# Patient Record
Sex: Female | Born: 1960 | Race: Black or African American | Hispanic: No | Smoking: Former smoker
Health system: Southern US, Community
[De-identification: ages and names within clinical notes are randomized; demographics above are authoritative.]

## PROBLEM LIST (undated history)

## (undated) DIAGNOSIS — K219 Gastro-esophageal reflux disease without esophagitis: Secondary | ICD-10-CM

## (undated) DIAGNOSIS — D571 Sickle-cell disease without crisis: Secondary | ICD-10-CM

## (undated) DIAGNOSIS — M1990 Unspecified osteoarthritis, unspecified site: Secondary | ICD-10-CM

## (undated) DIAGNOSIS — I10 Essential (primary) hypertension: Secondary | ICD-10-CM

## (undated) DIAGNOSIS — D573 Sickle-cell trait: Secondary | ICD-10-CM

## (undated) DIAGNOSIS — D649 Anemia, unspecified: Secondary | ICD-10-CM

## (undated) DIAGNOSIS — T7840XA Allergy, unspecified, initial encounter: Secondary | ICD-10-CM

## (undated) DIAGNOSIS — E611 Iron deficiency: Secondary | ICD-10-CM

## (undated) DIAGNOSIS — IMO0002 Reserved for concepts with insufficient information to code with codable children: Secondary | ICD-10-CM

## (undated) HISTORY — DX: Sickle-cell trait: D57.3

## (undated) HISTORY — DX: Reserved for concepts with insufficient information to code with codable children: IMO0002

## (undated) HISTORY — PX: ENDOMETRIAL ABLATION: SHX621

## (undated) HISTORY — PX: ANTERIOR CRUCIATE LIGAMENT REPAIR: SHX115

## (undated) HISTORY — DX: Unspecified osteoarthritis, unspecified site: M19.90

## (undated) HISTORY — DX: Anemia, unspecified: D64.9

## (undated) HISTORY — DX: Allergy, unspecified, initial encounter: T78.40XA

## (undated) HISTORY — PX: TUBAL LIGATION: SHX77

## (undated) HISTORY — PX: CHOLECYSTECTOMY: SHX55

---

## 2010-11-11 ENCOUNTER — Emergency Department (HOSPITAL_COMMUNITY)
Admission: EM | Admit: 2010-11-11 | Discharge: 2010-11-11 | Disposition: A | Discharge status: Home / Self Care | Payer: 59 | Attending: Emergency Medicine | Admitting: Emergency Medicine

## 2010-11-11 DIAGNOSIS — R252 Cramp and spasm: Secondary | ICD-10-CM | POA: Insufficient documentation

## 2010-11-11 DIAGNOSIS — M62838 Other muscle spasm: Secondary | ICD-10-CM | POA: Insufficient documentation

## 2014-08-26 ENCOUNTER — Emergency Department (HOSPITAL_COMMUNITY): Payer: BLUE CROSS/BLUE SHIELD

## 2014-08-26 ENCOUNTER — Observation Stay (HOSPITAL_COMMUNITY)
Admission: EM | Admit: 2014-08-26 | Discharge: 2014-08-28 | Disposition: A | Discharge status: Home / Self Care | Payer: BLUE CROSS/BLUE SHIELD | Attending: Internal Medicine | Admitting: Internal Medicine

## 2014-08-26 ENCOUNTER — Encounter (HOSPITAL_COMMUNITY): Payer: Self-pay | Admitting: *Deleted

## 2014-08-26 DIAGNOSIS — F1099 Alcohol use, unspecified with unspecified alcohol-induced disorder: Secondary | ICD-10-CM | POA: Insufficient documentation

## 2014-08-26 DIAGNOSIS — K219 Gastro-esophageal reflux disease without esophagitis: Secondary | ICD-10-CM | POA: Insufficient documentation

## 2014-08-26 DIAGNOSIS — Z9049 Acquired absence of other specified parts of digestive tract: Secondary | ICD-10-CM | POA: Diagnosis not present

## 2014-08-26 DIAGNOSIS — Z7982 Long term (current) use of aspirin: Secondary | ICD-10-CM | POA: Insufficient documentation

## 2014-08-26 DIAGNOSIS — Z87891 Personal history of nicotine dependence: Secondary | ICD-10-CM | POA: Insufficient documentation

## 2014-08-26 DIAGNOSIS — R0789 Other chest pain: Secondary | ICD-10-CM | POA: Diagnosis not present

## 2014-08-26 DIAGNOSIS — R079 Chest pain, unspecified: Secondary | ICD-10-CM | POA: Diagnosis not present

## 2014-08-26 DIAGNOSIS — Z882 Allergy status to sulfonamides status: Secondary | ICD-10-CM | POA: Insufficient documentation

## 2014-08-26 DIAGNOSIS — Z8249 Family history of ischemic heart disease and other diseases of the circulatory system: Secondary | ICD-10-CM | POA: Diagnosis not present

## 2014-08-26 DIAGNOSIS — Z886 Allergy status to analgesic agent status: Secondary | ICD-10-CM | POA: Diagnosis not present

## 2014-08-26 HISTORY — DX: Gastro-esophageal reflux disease without esophagitis: K21.9

## 2014-08-26 HISTORY — DX: Iron deficiency: E61.1

## 2014-08-26 LAB — D-DIMER, QUANTITATIVE (NOT AT ARMC)

## 2014-08-26 LAB — CBC
HEMATOCRIT: 30.3 % — AB (ref 36.0–46.0)
Hemoglobin: 10.5 g/dL — ABNORMAL LOW (ref 12.0–15.0)
MCH: 28.2 pg (ref 26.0–34.0)
MCHC: 34.7 g/dL (ref 30.0–36.0)
MCV: 81.5 fL (ref 78.0–100.0)
PLATELETS: 289 10*3/uL (ref 150–400)
RBC: 3.72 MIL/uL — ABNORMAL LOW (ref 3.87–5.11)
RDW: 14.5 % (ref 11.5–15.5)
WBC: 5.7 10*3/uL (ref 4.0–10.5)

## 2014-08-26 LAB — BASIC METABOLIC PANEL
ANION GAP: 9 (ref 5–15)
BUN: 19 mg/dL (ref 6–23)
CO2: 25 mmol/L (ref 19–32)
CREATININE: 0.96 mg/dL (ref 0.50–1.10)
Calcium: 8.9 mg/dL (ref 8.4–10.5)
Chloride: 104 mmol/L (ref 96–112)
GFR calc Af Amer: 77 mL/min — ABNORMAL LOW (ref >90)
GFR, EST NON AFRICAN AMERICAN: 67 mL/min — AB (ref >90)
Glucose, Bld: 99 mg/dL (ref 70–99)
Potassium: 4 mmol/L (ref 3.5–5.1)
Sodium: 138 mmol/L (ref 135–145)

## 2014-08-26 LAB — TROPONIN I: Troponin I: <0.03 ng/mL (ref <0.031)

## 2014-08-26 LAB — I-STAT TROPONIN, ED: Troponin i, poc: 0 ng/mL (ref 0.00–0.08)

## 2014-08-26 MED ORDER — HYDROMORPHONE HCL 1 MG/ML IJ SOLN
1.0000 mg | Freq: Once | INTRAMUSCULAR | Status: AC
Start: 2014-08-26 — End: 2014-08-26
  Administered 2014-08-26: 1 mg via INTRAVENOUS
  Filled 2014-08-26: qty 1

## 2014-08-26 MED ORDER — NITROGLYCERIN 0.4 MG SL SUBL
0.4000 mg | SUBLINGUAL_TABLET | SUBLINGUAL | Status: DC | PRN
  Administered 2014-08-26 – 2014-08-27 (×3): 0.4 mg via SUBLINGUAL

## 2014-08-26 MED ORDER — DIPHENHYDRAMINE HCL 50 MG/ML IJ SOLN
12.5000 mg | Freq: Once | INTRAMUSCULAR | Status: AC
  Administered 2014-08-26: 12.5 mg via INTRAVENOUS
  Filled 2014-08-26: qty 1

## 2014-08-26 MED ORDER — NITROGLYCERIN 0.4 MG SL SUBL
SUBLINGUAL_TABLET | SUBLINGUAL | Status: AC
  Administered 2014-08-26: 0.4 mg via SUBLINGUAL
  Filled 2014-08-26: qty 1

## 2014-08-26 MED ORDER — IOHEXOL 350 MG/ML SOLN
100.0000 mL | Freq: Once | INTRAVENOUS | Status: AC | PRN
  Administered 2014-08-26: 100 mL via INTRAVENOUS

## 2014-08-26 MED ORDER — CHLORHEXIDINE GLUCONATE 0.12 % MT SOLN
15.0000 mL | Freq: Two times a day (BID) | OROMUCOSAL | Status: DC
  Administered 2014-08-27 (×3): 15 mL via OROMUCOSAL
  Filled 2014-08-26 (×7): qty 15

## 2014-08-26 MED ORDER — ACETAMINOPHEN 325 MG PO TABS
650.0000 mg | ORAL_TABLET | ORAL | Status: DC | PRN
  Administered 2014-08-27 – 2014-08-28 (×3): 650 mg via ORAL
  Filled 2014-08-26 (×3): qty 2

## 2014-08-26 MED ORDER — CETYLPYRIDINIUM CHLORIDE 0.05 % MT LIQD
7.0000 mL | Freq: Two times a day (BID) | OROMUCOSAL | Status: DC
  Administered 2014-08-27: 7 mL via OROMUCOSAL

## 2014-08-26 MED ORDER — DIPHENHYDRAMINE HCL 50 MG/ML IJ SOLN
50.0000 mg | Freq: Once | INTRAMUSCULAR | Status: AC
  Administered 2014-08-26: 50 mg via INTRAVENOUS
  Filled 2014-08-26: qty 1

## 2014-08-26 MED ORDER — KETOROLAC TROMETHAMINE 30 MG/ML IJ SOLN
30.0000 mg | Freq: Once | INTRAMUSCULAR | Status: AC
  Administered 2014-08-26: 30 mg via INTRAVENOUS
  Filled 2014-08-26: qty 1

## 2014-08-26 MED ORDER — HEPARIN SODIUM (PORCINE) 5000 UNIT/ML IJ SOLN
5000.0000 [IU] | Freq: Three times a day (TID) | INTRAMUSCULAR | Status: DC
  Administered 2014-08-26 – 2014-08-28 (×4): 5000 [IU] via SUBCUTANEOUS
  Filled 2014-08-26 (×10): qty 1

## 2014-08-26 MED ORDER — ASPIRIN 81 MG PO CHEW
324.0000 mg | CHEWABLE_TABLET | Freq: Once | ORAL | Status: AC
  Administered 2014-08-26: 324 mg via ORAL
  Filled 2014-08-26: qty 4

## 2014-08-26 MED ORDER — ONDANSETRON HCL 4 MG/2ML IJ SOLN: 4.0000 mg | Freq: Four times a day (QID) | INTRAMUSCULAR | Status: DC | PRN

## 2014-08-26 MED ORDER — HYDROMORPHONE HCL 1 MG/ML IJ SOLN
1.0000 mg | Freq: Once | INTRAMUSCULAR | Status: AC
  Administered 2014-08-26: 1 mg via INTRAVENOUS
  Filled 2014-08-26: qty 1

## 2014-08-26 NOTE — H&P (Signed)
[facility] History and Physical  Barbara Eonllen Llanas ZOX:[id number] DOB: 23-Aug-1960 DOA: 08/26/2014  Referring physician: EDP PCP: No primary care provider on file.   Chief Complaint: Chest pain   HPI: Barbara [name] is a 54 y.o. female who presents to the ED with 1 hour history of sudden onset severe chest pain.  Central chest pain that radiates to her back.  Feels "like a bomb exploded in her chest".  Symptoms onset at rest while driving, have improved somewhat with pain meds in ED but are persistent.  Review of Systems: Systems reviewed.  As above, otherwise negative  Past Medical History  Diagnosis Date  . Low iron   . GERD (gastroesophageal reflux disease)    Past Surgical History  Procedure Laterality Date  . Cholecystectomy    . Anterior cruciate ligament repair    . Endometrial ablation     Social History:  reports that she has never smoked. She does not have any smokeless tobacco history on file. She reports that she drinks alcohol. She reports that she does not use illicit drugs.  Allergies  Allergen Reactions  . Morphine And Related Itching  . Sulfa Antibiotics Swelling    Family History  Problem Relation Age of Onset  . CAD Brother 2245    CABG at age 53, and deceased  . CAD Sister 50    1 stent  . CAD Father 4350    Deceased in 8150s     Prior to Admission medications   Medication Sig Start Date End Date Taking? Authorizing Provider  cyclobenzaprine (FLEXERIL) 5 MG tablet Take 1 tablet by mouth 3 (three) times daily as needed. 08/14/14  Yes Historical Provider, MD  loratadine (CLARITIN) 10 MG tablet Take 10 mg by mouth daily.   Yes Historical Provider, MD  meloxicam (MOBIC) 7.5 MG tablet Take 1 tablet by mouth daily as needed. 08/14/14  Yes Historical Provider, MD  omeprazole (PRILOSEC OTC) 20 MG tablet Take 20 mg by mouth daily.   Yes Historical Provider, MD   Physical Exam: Filed Vitals:   08/26/14 2139  BP: 152/93  Pulse: 74  Temp:   Resp: 18    BP  152/93 mmHg  Pulse 74  Temp(Src) 97.9 F (36.6 C) (Oral)  Resp 18  Ht 5\' 9"  (1.753 m)  Wt 77.111 kg (170 lb)  BMI 25.09 kg/m2  SpO2 97%  General Appearance:    Alert, oriented, no distress, appears stated age  Head:    Normocephalic, atraumatic  Eyes:    PERRL, EOMI, sclera non-icteric        Nose:   Nares without drainage or epistaxis. Mucosa, turbinates normal  Throat:   Moist mucous membranes. Oropharynx without erythema or exudate.  Neck:   Supple. No carotid bruits.  No thyromegaly.  No lymphadenopathy.   Back:     No CVA tenderness, no spinal tenderness  Lungs:     Clear to auscultation bilaterally, without wheezes, rhonchi or rales  Chest wall:    No tenderness to palpitation  Heart:    Regular rate and rhythm without murmurs, gallops, rubs  Abdomen:     Soft, non-tender, nondistended, normal bowel sounds, no organomegaly  Genitalia:    deferred  Rectal:    deferred  Extremities:   No clubbing, cyanosis or edema.  Pulses:   2+ and symmetric all extremities  Skin:   Skin color, texture, turgor normal, no rashes or lesions  Lymph nodes:   Cervical, supraclavicular, and axillary nodes normal  Neurologic:   CNII-XII intact. Normal strength, sensation and reflexes      throughout    Labs on Admission:  Basic Metabolic Panel:  Recent Labs Lab 08/26/14 1716  NA 138  K 4.0  CL 104  CO2 25  GLUCOSE 99  BUN 19  CREATININE 0.96  CALCIUM 8.9   Liver Function Tests: No results for input(s): AST, ALT, ALKPHOS, BILITOT, PROT, ALBUMIN in the last 168 hours. No results for input(s): LIPASE, AMYLASE in the last 168 hours. No results for input(s): AMMONIA in the last 168 hours. CBC:  Recent Labs Lab 08/26/14 1716  WBC 5.7  HGB 10.5*  HCT 30.3*  MCV 81.5  PLT 289   Cardiac Enzymes: No results for input(s): CKTOTAL, CKMB, CKMBINDEX, TROPONINI in the last 168 hours.  BNP (last 3 results) No results for input(s): PROBNP in the last 8760 hours. CBG: No results for  input(s): GLUCAP in the last 168 hours.  Radiological Exams on Admission: Dg Chest Portable 1 View  08/26/2014   CLINICAL DATA:  Chest pain and difficulty breathing, acute  EXAM: PORTABLE CHEST - 1 VIEW  COMPARISON:  None.  FINDINGS: Lungs are clear. Heart size and pulmonary vascularity are normal. No adenopathy. No pneumothorax. No bone lesions.  IMPRESSION: No edema or consolidation.   Electronically Signed   By: Bretta Bang III M.D.   On: 08/26/2014 17:58   Ct Angio Chest Aorta W/cm &/or Wo/cm  08/26/2014   CLINICAL DATA:  Sudden onset chest pain radiating to back, shortness of breath, light headedness  EXAM: CT ANGIOGRAPHY CHEST, ABDOMEN AND PELVIS  TECHNIQUE: Multidetector CT imaging through the chest, abdomen and pelvis was performed using the standard protocol during bolus administration of intravenous contrast. Multiplanar reconstructed images and MIPs were obtained and reviewed to evaluate the vascular anatomy.  CONTRAST:  OMNIPAQUE IOHEXOL 350 MG/ML SOLN  COMPARISON:  None.  FINDINGS: Unenhanced CT, there is no evidence of intramural hematoma.  No evidence of thoracoabdominal aortic aneurysm or dissection.  Although not tailored for evaluation of the pulmonary arteries, there is no evidence of pulmonary embolism.  Celiac artery, SMA, IMA, renal arteries, iliac arteries are patent.  CT CHEST FINDINGS  Mediastinum/Nodes: The heart is normal in size. No pericardial effusion.  No suspicious mediastinal, hilar, or axillary lymphadenopathy.  Visualized thyroid is unremarkable.  Lungs/Pleura: Lungs are clear.  No focal consolidation.  No suspicious pulmonary nodules.  No pleural effusion or pneumothorax.  Musculoskeletal: No focal osseous lesions.  CT ABDOMEN PELVIS FINDINGS  Hepatobiliary: Liver is notable for focal fat/altered perfusion along the falciform ligament.  Status post cholecystectomy. No intrahepatic or extrahepatic ductal dilatation.  Pancreas: Within normal limits.  Spleen: Within  normal limits.  Adrenals/Urinary Tract: Adrenal glands are within normal limits.  Kidneys within normal limits.  No hydronephrosis.  Bladder is within normal limits.  Stomach/Bowel: Stomach is within normal limits.  No evidence of bowel obstruction.  Normal appendix.  Vascular/Lymphatic: Vascular findings as above.  No suspicious abdominopelvic lymphadenopathy.  Reproductive: Uterus is unremarkable.  Bilateral ovaries are within normal limits.  Other: No abdominopelvic ascites.  Musculoskeletal: Degenerative changes at L5-S1.  Review of the MIP images confirms the above findings.  IMPRESSION: No evidence of thoracoabdominal aortic aneurysm or dissection.  No evidence of pulmonary embolism.  Normal CT chest.  Negative CT abdomen/pelvis.   Electronically Signed   By: Charline Bills M.D.   On: 08/26/2014 20:03   Ct Angio Abd/pel W/ And/or W/o  08/26/2014  CLINICAL DATA:  Sudden onset chest pain radiating to back, shortness of breath, light headedness  EXAM: CT ANGIOGRAPHY CHEST, ABDOMEN AND PELVIS  TECHNIQUE: Multidetector CT imaging through the chest, abdomen and pelvis was performed using the standard protocol during bolus administration of intravenous contrast. Multiplanar reconstructed images and MIPs were obtained and reviewed to evaluate the vascular anatomy.  CONTRAST:  OMNIPAQUE IOHEXOL 350 MG/ML SOLN  COMPARISON:  None.  FINDINGS: Unenhanced CT, there is no evidence of intramural hematoma.  No evidence of thoracoabdominal aortic aneurysm or dissection.  Although not tailored for evaluation of the pulmonary arteries, there is no evidence of pulmonary embolism.  Celiac artery, SMA, IMA, renal arteries, iliac arteries are patent.  CT CHEST FINDINGS  Mediastinum/Nodes: The heart is normal in size. No pericardial effusion.  No suspicious mediastinal, hilar, or axillary lymphadenopathy.  Visualized thyroid is unremarkable.  Lungs/Pleura: Lungs are clear.  No focal consolidation.  No suspicious pulmonary  nodules.  No pleural effusion or pneumothorax.  Musculoskeletal: No focal osseous lesions.  CT ABDOMEN PELVIS FINDINGS  Hepatobiliary: Liver is notable for focal fat/altered perfusion along the falciform ligament.  Status post cholecystectomy. No intrahepatic or extrahepatic ductal dilatation.  Pancreas: Within normal limits.  Spleen: Within normal limits.  Adrenals/Urinary Tract: Adrenal glands are within normal limits.  Kidneys within normal limits.  No hydronephrosis.  Bladder is within normal limits.  Stomach/Bowel: Stomach is within normal limits.  No evidence of bowel obstruction.  Normal appendix.  Vascular/Lymphatic: Vascular findings as above.  No suspicious abdominopelvic lymphadenopathy.  Reproductive: Uterus is unremarkable.  Bilateral ovaries are within normal limits.  Other: No abdominopelvic ascites.  Musculoskeletal: Degenerative changes at L5-S1.  Review of the MIP images confirms the above findings.  IMPRESSION: No evidence of thoracoabdominal aortic aneurysm or dissection.  No evidence of pulmonary embolism.  Normal CT chest.  Negative CT abdomen/pelvis.   Electronically Signed   By: Charline Bills M.D.   On: 08/26/2014 20:03    EKG: Independently reviewed.  Assessment/Plan Active Problems:   Chest pain   1. Chest pain - negative CTA for disection, d-dimer is negative, admitting for cardiac rule out 1. Serial trops 2. Chest pain obs protocol 3. Call cards re stress test in AM 4. HEART score is only 4.  Does have a very worrisome family history however.  Code Status: Full Code  Family Communication: Family at bedside Disposition Plan: Admit to obs   Time spent: 50 min  Lamyah Creed M. [facility] Pager [phone]  If 7AM-7PM, please contact the day team taking care of the patient [email or web address] Password [facility] 08/26/2014, 9:54 PM

## 2014-08-26 NOTE — ED Notes (Signed)
Pt reports she was driving and had a sudden onset sharp central chest pain approx PTA (1645). C/o sob, lightheadedness, radiation into back, R arm feeling numb, nausea and clamminess. Denies vomiting.

## 2014-08-26 NOTE — ED Provider Notes (Signed)
CSN: [id number]     Arrival date & time 08/26/14  1655 History   First MD Initiated Contact with Patient 08/26/14 1712     Chief Complaint  Patient presents with  . Chest Pain  . Arm Pain  . Dizziness     (Consider location/radiation/quality/duration/timing/severity/associated sxs/prior Treatment) HPI Comments: Was driving her car with sudden onset sharp chest pain. Feels like a bomb went off in her chest. No alleviating/exacerbating factors when asked, but when asked specifically with relation to deep breathing she reports it gets worse.  Patient is a 54 y.o. female presenting with chest pain, arm pain, and dizziness. The history is provided by the patient.  Chest Pain Pain location:  Substernal area Pain quality: radiating and sharp   Pain quality comment:  "like a bomb exploded" Pain radiates to:  Upper back and R arm Pain radiates to the back: yes   Pain severity:  Severe Onset quality:  Sudden Duration:  1 hour Timing:  Constant Progression:  Improving Chronicity:  New Context: at rest   Relieved by:  Nothing Worsened by:  Deep breathing Associated symptoms: diaphoresis, dizziness and shortness of breath   Associated symptoms: no abdominal pain, no cough, no fever and not vomiting   Risk factors: no aortic disease, no high cholesterol and no hypertension   Arm Pain Associated symptoms include chest pain and shortness of breath. Pertinent negatives include no abdominal pain.  Dizziness Associated symptoms: chest pain and shortness of breath   Associated symptoms: no vomiting     Past Medical History  Diagnosis Date  . Low iron   . GERD (gastroesophageal reflux disease)    Past Surgical History  Procedure Laterality Date  . Cholecystectomy    . Anterior cruciate ligament repair    . Endometrial ablation     No family history on file. History  Substance Use Topics  . Smoking status: Never Smoker   . Smokeless tobacco: Not on file  . Alcohol Use: Yes      Comment: occasionally, glass of wine   OB History    No data available     Review of Systems  Constitutional: Positive for diaphoresis. Negative for fever.  Respiratory: Positive for shortness of breath. Negative for cough.   Cardiovascular: Positive for chest pain. Negative for leg swelling.  Gastrointestinal: Negative for vomiting and abdominal pain.  Neurological: Positive for dizziness.  All other systems reviewed and are negative.     Allergies  Morphine and related and Sulfa antibiotics  Home Medications   Prior to Admission medications   Medication Sig Start Date End Date Taking? Authorizing Provider  cyclobenzaprine (FLEXERIL) 5 MG tablet Take 1 tablet by mouth 3 (three) times daily as needed. 08/14/14  Yes Historical Provider, MD  loratadine (CLARITIN) 10 MG tablet Take 10 mg by mouth daily.   Yes Historical Provider, MD  meloxicam (MOBIC) 7.5 MG tablet Take 1 tablet by mouth daily as needed. 08/14/14  Yes Historical Provider, MD  omeprazole (PRILOSEC OTC) 20 MG tablet Take 20 mg by mouth daily.   Yes Historical Provider, MD   BP 156/92 mmHg  Pulse 113  Temp(Src) 97.9 F (36.6 C) (Oral)  Resp 23  Ht  (1.753 m)  Wt 170 lb (77.111 kg)  BMI 25.09 kg/m2  SpO2 98% Physical Exam  Constitutional: She is oriented to person, place, and time. She appears well-developed and well-nourished. No distress.  HENT:  Head: Normocephalic and atraumatic.  Mouth/Throat: Oropharynx is clear and moist.  Eyes: EOM are normal. Pupils are equal, round, and reactive to light.  Neck: Normal range of motion. Neck supple.  Cardiovascular: Normal rate and regular rhythm.  Exam reveals no friction rub.   No murmur heard. Pulmonary/Chest: Effort normal and breath sounds normal. No respiratory distress. She has no wheezes. She has no rales.  Abdominal: Soft. She exhibits no distension. There is no tenderness. There is no rebound.  Musculoskeletal: Normal range of motion. She exhibits no  edema.  Neurological: She is alert and oriented to person, place, and time. No cranial nerve deficit. She exhibits normal muscle tone.  Skin: No rash noted. She is not diaphoretic.  Nursing note and vitals reviewed.   ED Course  Procedures (including critical care time) Labs Review Labs Reviewed  CBC  BASIC METABOLIC PANEL  D-DIMER, QUANTITATIVE  I-STAT TROPOININ, ED    Imaging Review Dg Chest Portable 1 View  08/26/2014   CLINICAL DATA:  Chest pain and difficulty breathing, acute  EXAM: PORTABLE CHEST - 1 VIEW  COMPARISON:  None.  FINDINGS: Lungs are clear. Heart size and pulmonary vascularity are normal. No adenopathy. No pneumothorax. No bone lesions.  IMPRESSION: No edema or consolidation.   Electronically Signed   By: Bretta Bang III M.D.   On: 08/26/2014 17:58   Ct Angio Chest Aorta W/cm &/or Wo/cm  08/26/2014   CLINICAL DATA:  Sudden onset chest pain radiating to back, shortness of breath, light headedness  EXAM: CT ANGIOGRAPHY CHEST, ABDOMEN AND PELVIS  TECHNIQUE: Multidetector CT imaging through the chest, abdomen and pelvis was performed using the standard protocol during bolus administration of intravenous contrast. Multiplanar reconstructed images and MIPs were obtained and reviewed to evaluate the vascular anatomy.  CONTRAST:  OMNIPAQUE IOHEXOL 350 MG/ML SOLN  COMPARISON:  None.  FINDINGS: Unenhanced CT, there is no evidence of intramural hematoma.  No evidence of thoracoabdominal aortic aneurysm or dissection.  Although not tailored for evaluation of the pulmonary arteries, there is no evidence of pulmonary embolism.  Celiac artery, SMA, IMA, renal arteries, iliac arteries are patent.  CT CHEST FINDINGS  Mediastinum/Nodes: The heart is normal in size. No pericardial effusion.  No suspicious mediastinal, hilar, or axillary lymphadenopathy.  Visualized thyroid is unremarkable.  Lungs/Pleura: Lungs are clear.  No focal consolidation.  No suspicious pulmonary nodules.  No  pleural effusion or pneumothorax.  Musculoskeletal: No focal osseous lesions.  CT ABDOMEN PELVIS FINDINGS  Hepatobiliary: Liver is notable for focal fat/altered perfusion along the falciform ligament.  Status post cholecystectomy. No intrahepatic or extrahepatic ductal dilatation.  Pancreas: Within normal limits.  Spleen: Within normal limits.  Adrenals/Urinary Tract: Adrenal glands are within normal limits.  Kidneys within normal limits.  No hydronephrosis.  Bladder is within normal limits.  Stomach/Bowel: Stomach is within normal limits.  No evidence of bowel obstruction.  Normal appendix.  Vascular/Lymphatic: Vascular findings as above.  No suspicious abdominopelvic lymphadenopathy.  Reproductive: Uterus is unremarkable.  Bilateral ovaries are within normal limits.  Other: No abdominopelvic ascites.  Musculoskeletal: Degenerative changes at L5-S1.  Review of the MIP images confirms the above findings.  IMPRESSION: No evidence of thoracoabdominal aortic aneurysm or dissection.  No evidence of pulmonary embolism.  Normal CT chest.  Negative CT abdomen/pelvis.   Electronically Signed   By: Charline Bills M.D.   On: 08/26/2014 20:03   Ct Angio Abd/pel W/ And/or W/o  08/26/2014   CLINICAL DATA:  Sudden onset chest pain radiating to back, shortness of breath, light headedness  EXAM:  CT ANGIOGRAPHY CHEST, ABDOMEN AND PELVIS  TECHNIQUE: Multidetector CT imaging through the chest, abdomen and pelvis was performed using the standard protocol during bolus administration of intravenous contrast. Multiplanar reconstructed images and MIPs were obtained and reviewed to evaluate the vascular anatomy.  CONTRAST:  100mL OMNIPAQUE IOHEXOL 350 MG/ML SOLN  COMPARISON:  None.  FINDINGS: Unenhanced CT, there is no evidence of intramural hematoma.  No evidence of thoracoabdominal aortic aneurysm or dissection.  Although not tailored for evaluation of the pulmonary arteries, there is no evidence of pulmonary embolism.  Celiac  artery, SMA, IMA, renal arteries, iliac arteries are patent.  CT CHEST FINDINGS  Mediastinum/Nodes: The heart is normal in size. No pericardial effusion.  No suspicious mediastinal, hilar, or axillary lymphadenopathy.  Visualized thyroid is unremarkable.  Lungs/Pleura: Lungs are clear.  No focal consolidation.  No suspicious pulmonary nodules.  No pleural effusion or pneumothorax.  Musculoskeletal: No focal osseous lesions.  CT ABDOMEN PELVIS FINDINGS  Hepatobiliary: Liver is notable for focal fat/altered perfusion along the falciform ligament.  Status post cholecystectomy. No intrahepatic or extrahepatic ductal dilatation.  Pancreas: Within normal limits.  Spleen: Within normal limits.  Adrenals/Urinary Tract: Adrenal glands are within normal limits.  Kidneys within normal limits.  No hydronephrosis.  Bladder is within normal limits.  Stomach/Bowel: Stomach is within normal limits.  No evidence of bowel obstruction.  Normal appendix.  Vascular/Lymphatic: Vascular findings as above.  No suspicious abdominopelvic lymphadenopathy.  Reproductive: Uterus is unremarkable.  Bilateral ovaries are within normal limits.  Other: No abdominopelvic ascites.  Musculoskeletal: Degenerative changes at L5-S1.  Review of the MIP images confirms the above findings.  IMPRESSION: No evidence of thoracoabdominal aortic aneurysm or dissection.  No evidence of pulmonary embolism.  Normal CT chest.  Negative CT abdomen/pelvis.   Electronically Signed   By: Charline BillsSriyesh  Krishnan M.D.   On: 08/26/2014 20:03     EKG Interpretation   Date/Time:  Tuesday August 26 2014 17:02:16 EDT Ventricular Rate:  117 PR Interval:  117 QRS Duration: 80 QT Interval:  413 QTC Calculation: 576 R Axis:   64 Text Interpretation:  Sinus tachycardia Probable left atrial enlargement  Repol abnrm suggests ischemia, anterolateral Prolonged QT interval  Baseline wander in lead(s) I III aVL No prior for comparison Confirmed by  Gwendolyn GrantWALDEN  MD, Veronnica Hennings ([id number]) on  08/26/2014 5:12:35 PM      MDM   Final diagnoses:  Chest pain    54 year old female here with chest pain. I'm concern for aortic dissection. She reports it feels like a bomb went off in her chest and in the pain with straight her back. His fascia. She also reports radiation of the right arm with right arm tingling. She denies any lower extremity symptoms. No alleviating or exacerbating factors when asked, more specifically about deep breaths she said it does make the pain worse. No history of blood clots, high blood pressure, smoking. Does have strong family history of heart disease. Blood pressures equal in both arms. With the "bomb-like" sensation, will CT her Aorta. CTs negative, no dissection or PE. Still having pain. HEART score of 4 - 1 for story, 1 for age, 2 for risk factors. Admitted for ACS.  Elwin MochaBlair Mariame Rybolt, MD 08/26/14 [phone]

## 2014-08-26 NOTE — Progress Notes (Signed)
EDCM spoke to patient at bedside.  Patient confirms she has [employer] and [employer] without a pcp.  EDCM provided patient with list of pcps who accept [employer] insurance within a ten mile radius of patient's zip code.  Discussed the use of urgent care centers versus ED.  Discussed importance and purpose of having a pcp.  Patient thankful for services.  No further EDCM needs at this time.

## 2014-08-27 ENCOUNTER — Encounter (HOSPITAL_COMMUNITY): Payer: Self-pay | Admitting: Cardiology

## 2014-08-27 ENCOUNTER — Observation Stay (HOSPITAL_COMMUNITY): Payer: BLUE CROSS/BLUE SHIELD

## 2014-08-27 ENCOUNTER — Encounter (HOSPITAL_COMMUNITY)
Admission: EM | Disposition: A | Discharge status: Home / Self Care | Payer: Self-pay | Source: Home / Self Care | Attending: Emergency Medicine

## 2014-08-27 DIAGNOSIS — R0789 Other chest pain: Secondary | ICD-10-CM | POA: Diagnosis not present

## 2014-08-27 DIAGNOSIS — K219 Gastro-esophageal reflux disease without esophagitis: Secondary | ICD-10-CM | POA: Diagnosis not present

## 2014-08-27 DIAGNOSIS — Z87891 Personal history of nicotine dependence: Secondary | ICD-10-CM | POA: Diagnosis not present

## 2014-08-27 DIAGNOSIS — I209 Angina pectoris, unspecified: Secondary | ICD-10-CM | POA: Diagnosis not present

## 2014-08-27 DIAGNOSIS — I200 Unstable angina: Secondary | ICD-10-CM | POA: Diagnosis not present

## 2014-08-27 DIAGNOSIS — F1099 Alcohol use, unspecified with unspecified alcohol-induced disorder: Secondary | ICD-10-CM | POA: Diagnosis not present

## 2014-08-27 HISTORY — PX: LEFT HEART CATHETERIZATION WITH CORONARY ANGIOGRAM: SHX5451

## 2014-08-27 LAB — TROPONIN I: Troponin I: <0.03 ng/mL (ref <0.031)

## 2014-08-27 SURGERY — LEFT HEART CATHETERIZATION WITH CORONARY ANGIOGRAM
Anesthesia: LOCAL

## 2014-08-27 MED ORDER — FAMOTIDINE 10 MG PO TABS
10.0000 mg | ORAL_TABLET | Freq: Every day | ORAL | Status: DC
  Administered 2014-08-27: 10 mg via ORAL
  Filled 2014-08-27 (×2): qty 1

## 2014-08-27 MED ORDER — ASPIRIN 81 MG PO CHEW: 81.0000 mg | CHEWABLE_TABLET | ORAL | Status: DC

## 2014-08-27 MED ORDER — NITROGLYCERIN 1 MG/10 ML FOR IR/CATH LAB
INTRA_ARTERIAL | Status: AC
  Filled 2014-08-27: qty 10

## 2014-08-27 MED ORDER — LIDOCAINE HCL (PF) 1 % IJ SOLN
INTRAMUSCULAR | Status: AC
  Filled 2014-08-27: qty 30

## 2014-08-27 MED ORDER — ALUM & MAG HYDROXIDE-SIMETH 200-200-20 MG/5ML PO SUSP
30.0000 mL | ORAL | Status: DC | PRN
  Administered 2014-08-27: 30 mL via ORAL
  Filled 2014-08-27: qty 30

## 2014-08-27 MED ORDER — PANTOPRAZOLE SODIUM 40 MG PO TBEC
80.0000 mg | DELAYED_RELEASE_TABLET | Freq: Two times a day (BID) | ORAL | Status: DC
  Administered 2014-08-27: 80 mg via ORAL
  Filled 2014-08-27: qty 2

## 2014-08-27 MED ORDER — MIDAZOLAM HCL 2 MG/2ML IJ SOLN
INTRAMUSCULAR | Status: AC
  Filled 2014-08-27: qty 2

## 2014-08-27 MED ORDER — HEPARIN (PORCINE) IN NACL 2-0.9 UNIT/ML-% IJ SOLN
INTRAMUSCULAR | Status: AC
  Filled 2014-08-27: qty 1000

## 2014-08-27 MED ORDER — OMEPRAZOLE MAGNESIUM 20 MG PO TBEC: 40.0000 mg | DELAYED_RELEASE_TABLET | Freq: Two times a day (BID) | ORAL | Status: DC

## 2014-08-27 MED ORDER — FENTANYL CITRATE 0.05 MG/ML IJ SOLN
INTRAMUSCULAR | Status: AC
  Filled 2014-08-27: qty 2

## 2014-08-27 MED ORDER — VERAPAMIL HCL 2.5 MG/ML IV SOLN
INTRAVENOUS | Status: AC
  Filled 2014-08-27: qty 2

## 2014-08-27 MED ORDER — HEPARIN SODIUM (PORCINE) 1000 UNIT/ML IJ SOLN
INTRAMUSCULAR | Status: AC
  Filled 2014-08-27: qty 1

## 2014-08-27 MED ORDER — SODIUM CHLORIDE 0.9 % IJ SOLN: 3.0000 mL | INTRAMUSCULAR | Status: DC | PRN

## 2014-08-27 MED ORDER — NITROGLYCERIN 0.4 MG SL SUBL
SUBLINGUAL_TABLET | SUBLINGUAL | Status: AC
  Filled 2014-08-27: qty 1

## 2014-08-27 MED ORDER — SODIUM CHLORIDE 0.9 % IV SOLN
INTRAVENOUS | Status: AC
Start: 2014-08-27 — End: 2014-08-27
  Administered 2014-08-27: 19:00:00 via INTRAVENOUS

## 2014-08-27 MED ORDER — SODIUM CHLORIDE 0.9 % IJ SOLN
3.0000 mL | Freq: Two times a day (BID) | INTRAMUSCULAR | Status: DC
  Administered 2014-08-27: 3 mL via INTRAVENOUS

## 2014-08-27 MED ORDER — ASPIRIN 325 MG PO TABS
325.0000 mg | ORAL_TABLET | Freq: Every day | ORAL | Status: DC
  Administered 2014-08-27: 325 mg via ORAL
  Filled 2014-08-27: qty 1

## 2014-08-27 MED ORDER — LABETALOL HCL 5 MG/ML IV SOLN
10.0000 mg | Freq: Once | INTRAVENOUS | Status: AC
  Administered 2014-08-27: 10 mg via INTRAVENOUS

## 2014-08-27 MED ORDER — SODIUM CHLORIDE 0.9 % IV SOLN: 250.0000 mL | INTRAVENOUS | Status: DC | PRN

## 2014-08-27 MED ORDER — LABETALOL HCL 5 MG/ML IV SOLN
INTRAVENOUS | Status: AC
  Filled 2014-08-27: qty 4

## 2014-08-27 NOTE — Progress Notes (Signed)
Patient arrived to cath lab via EMS. Patient states no chest pain at this time. IV's patent. Rt DP pulse palpable. Patient resting comfortable at this time. Will continue to monitor patient.

## 2014-08-27 NOTE — Progress Notes (Signed)
Blood pressure slightly elevated. MD aware and medications given. Patient resting in bed with no complaints. TR band remains stable on Rt radial site. Report called to Brandi,RN [facility]. Patient to be transferred at this time.

## 2014-08-27 NOTE — Progress Notes (Signed)
Patient arrived to cath lab holding area. Patient has no complaints. VSS. Rt radial site WNL. Will continue to monitor patient.

## 2014-08-27 NOTE — H&P (View-Only) (Signed)
Reason for Consult: Chest pain  Requesting Physician: Dr. Darnelle Catalan  HPI: Barbara [name] a 54 year old mildly overweight divorced African-American female mother of 2 children works in a Music therapist at [employer]. She was admitted yesterday with chest pain which was nitroglycerin responsive. Her cardiac risk factor profile is notable for discontinued tobacco abuse although she only started smoking several years ago. She does have a fairly impressive family history with a father who died of myocardial infarction and 3 out of 4 siblings who have had coronary disease and intervention. She developed chest pain while driving home from work last night. The pain was substernal and radiated to her back. It was relieved with 2 sublingual nitroglycerin. She additionally received analgesics. She does have a history of bleeding ulcers in the past the last one of which was 7 years ago. She says these symptoms are not similar. She does take over-the-counter Prilosec. She was scheduled for a stress test today however she had recurrent chest pain that was nitrate responsive.  Problem List: Patient Active Problem List   Diagnosis Date Noted  . Chest pain 08/26/2014    PMHx:  Past Medical History  Diagnosis Date  . Low iron   . GERD (gastroesophageal reflux disease)    Past Surgical History  Procedure Laterality Date  . Cholecystectomy    . Anterior cruciate ligament repair    . Endometrial ablation      FAMHx: Family History  Problem Relation Age of Onset  . CAD Brother 44    CABG at age 62, and deceased  . CAD Sister 50    1 stent  . CAD Father 34    Deceased in 54s    SOCHx:  reports that she has never smoked. She does not have any smokeless tobacco history on file. She reports that she drinks alcohol. She reports that she does not use illicit drugs.  ALLERGIES: Allergies  Allergen Reactions  . Morphine And Related Itching  . Sulfa Antibiotics Swelling    ROS: Pertinent items are  noted in HPI.  HOME MEDICATIONS: Prescriptions prior to admission  Medication Sig Dispense Refill Last Dose  . cyclobenzaprine (FLEXERIL) 5 MG tablet Take 1 tablet by mouth 3 (three) times daily as needed.  0 08/23/2014  . loratadine (CLARITIN) 10 MG tablet Take 10 mg by mouth daily.   08/26/2014 at Unknown time  . meloxicam (MOBIC) 7.5 MG tablet Take 1 tablet by mouth daily as needed.  0 08/23/2014  . omeprazole (PRILOSEC OTC) 20 MG tablet Take 20 mg by mouth daily.   08/26/2014 at Unknown time    HOSPITAL MEDICATIONS: I have reviewed the patient's current medications.  VITALS: Blood pressure 113/78, pulse 89, temperature 98 F (36.7 C), temperature source Oral, resp. rate 18, height  (1.753 m), weight 177 lb 0.5 oz (80.3 kg), SpO2 99 %.  INPUT/OUTPUT I/O last 3 completed shifts: In: 240 [P.O.:240] Out: 600 [Urine:600]      PHYSICAL EXAM: General appearance: alert and no distress Neck: no adenopathy, no carotid bruit, no JVD, supple, symmetrical, trachea midline and thyroid not enlarged, symmetric, no tenderness/mass/nodules Lungs: clear to auscultation bilaterally Heart: regular rate and rhythm, S1, S2 normal, no murmur, click, rub or gallop Extremities: extremities normal, atraumatic, no cyanosis or edema and 2+ pedal pulses bilaterally  LABS:  BMP  Recent Labs  08/26/14 1716  NA 138  K 4.0  CL 104  CO2 25  GLUCOSE 99  BUN 19  CREATININE 0.96  CALCIUM 8.9  GFRNONAA 67*  GFRAA 77*    CBC  Recent Labs Lab 08/26/14 1716  WBC 5.7  RBC 3.72*  HGB 10.5*  HCT 30.3*  PLT 289  MCV 81.5    HEMOGLOBIN A1C No results found for: HGBA1C, MPG  Cardiac Panel (last 3 results)  Recent Labs  08/26/14 2225 08/27/14 0105 08/27/14 0345  TROPONINI <0.03 <0.03 <0.03    BNP (last 3 results) No results for input(s): PROBNP in the last 8760 hours.  TSH No results for input(s): TSH in the last 8760 hours.  CHOLESTEROL No results for input(s): CHOL in the last  8760 hours.  Hepatic Function Panel No results for input(s): PROT, ALBUMIN, AST, ALT, ALKPHOS, BILITOT, BILIDIR, IBILI in the last 8760 hours.  IMAGING: Dg Chest Portable 1 View  08/26/2014   CLINICAL DATA:  Chest pain and difficulty breathing, acute  EXAM: PORTABLE CHEST - 1 VIEW  COMPARISON:  None.  FINDINGS: Lungs are clear. Heart size and pulmonary vascularity are normal. No adenopathy. No pneumothorax. No bone lesions.  IMPRESSION: No edema or consolidation.   Electronically Signed   By: Bretta BangWilliam  Woodruff III M.D.   On: 08/26/2014 17:58   Ct Angio Chest Aorta W/cm &/or Wo/cm  08/26/2014   CLINICAL DATA:  Sudden onset chest pain radiating to back, shortness of breath, light headedness  EXAM: CT ANGIOGRAPHY CHEST, ABDOMEN AND PELVIS  TECHNIQUE: Multidetector CT imaging through the chest, abdomen and pelvis was performed using the standard protocol during bolus administration of intravenous contrast. Multiplanar reconstructed images and MIPs were obtained and reviewed to evaluate the vascular anatomy.  CONTRAST:  100mL OMNIPAQUE IOHEXOL 350 MG/ML SOLN  COMPARISON:  None.  FINDINGS: Unenhanced CT, there is no evidence of intramural hematoma.  No evidence of thoracoabdominal aortic aneurysm or dissection.  Although not tailored for evaluation of the pulmonary arteries, there is no evidence of pulmonary embolism.  Celiac artery, SMA, IMA, renal arteries, iliac arteries are patent.  CT CHEST FINDINGS  Mediastinum/Nodes: The heart is normal in size. No pericardial effusion.  No suspicious mediastinal, hilar, or axillary lymphadenopathy.  Visualized thyroid is unremarkable.  Lungs/Pleura: Lungs are clear.  No focal consolidation.  No suspicious pulmonary nodules.  No pleural effusion or pneumothorax.  Musculoskeletal: No focal osseous lesions.  CT ABDOMEN PELVIS FINDINGS  Hepatobiliary: Liver is notable for focal fat/altered perfusion along the falciform ligament.  Status post cholecystectomy. No intrahepatic or  extrahepatic ductal dilatation.  Pancreas: Within normal limits.  Spleen: Within normal limits.  Adrenals/Urinary Tract: Adrenal glands are within normal limits.  Kidneys within normal limits.  No hydronephrosis.  Bladder is within normal limits.  Stomach/Bowel: Stomach is within normal limits.  No evidence of bowel obstruction.  Normal appendix.  Vascular/Lymphatic: Vascular findings as above.  No suspicious abdominopelvic lymphadenopathy.  Reproductive: Uterus is unremarkable.  Bilateral ovaries are within normal limits.  Other: No abdominopelvic ascites.  Musculoskeletal: Degenerative changes at L5-S1.  Review of the MIP images confirms the above findings.  IMPRESSION: No evidence of thoracoabdominal aortic aneurysm or dissection.  No evidence of pulmonary embolism.  Normal CT chest.  Negative CT abdomen/pelvis.   Electronically Signed   By: Charline BillsSriyesh  Krishnan M.D.   On: 08/26/2014 20:03   Ct Angio Abd/pel W/ And/or W/o  08/26/2014   CLINICAL DATA:  Sudden onset chest pain radiating to back, shortness of breath, light headedness  EXAM: CT ANGIOGRAPHY CHEST, ABDOMEN AND PELVIS  TECHNIQUE: Multidetector CT imaging through the chest, abdomen and pelvis  was performed using the standard protocol during bolus administration of intravenous contrast. Multiplanar reconstructed images and MIPs were obtained and reviewed to evaluate the vascular anatomy.  CONTRAST:  OMNIPAQUE IOHEXOL 350 MG/ML SOLN  COMPARISON:  None.  FINDINGS: Unenhanced CT, there is no evidence of intramural hematoma.  No evidence of thoracoabdominal aortic aneurysm or dissection.  Although not tailored for evaluation of the pulmonary arteries, there is no evidence of pulmonary embolism.  Celiac artery, SMA, IMA, renal arteries, iliac arteries are patent.  CT CHEST FINDINGS  Mediastinum/Nodes: The heart is normal in size. No pericardial effusion.  No suspicious mediastinal, hilar, or axillary lymphadenopathy.  Visualized thyroid is unremarkable.   Lungs/Pleura: Lungs are clear.  No focal consolidation.  No suspicious pulmonary nodules.  No pleural effusion or pneumothorax.  Musculoskeletal: No focal osseous lesions.  CT ABDOMEN PELVIS FINDINGS  Hepatobiliary: Liver is notable for focal fat/altered perfusion along the falciform ligament.  Status post cholecystectomy. No intrahepatic or extrahepatic ductal dilatation.  Pancreas: Within normal limits.  Spleen: Within normal limits.  Adrenals/Urinary Tract: Adrenal glands are within normal limits.  Kidneys within normal limits.  No hydronephrosis.  Bladder is within normal limits.  Stomach/Bowel: Stomach is within normal limits.  No evidence of bowel obstruction.  Normal appendix.  Vascular/Lymphatic: Vascular findings as above.  No suspicious abdominopelvic lymphadenopathy.  Reproductive: Uterus is unremarkable.  Bilateral ovaries are within normal limits.  Other: No abdominopelvic ascites.  Musculoskeletal: Degenerative changes at L5-S1.  Review of the MIP images confirms the above findings.  IMPRESSION: No evidence of thoracoabdominal aortic aneurysm or dissection.  No evidence of pulmonary embolism.  Normal CT chest.  Negative CT abdomen/pelvis.   Electronically Signed   By: Charline Bills M.D.   On: 08/26/2014 20:03    EKG- normal sinus rhythm with nonspecific ST and T-wave changes. I personally reviewed this EKG  IMPRESSION: 1. Chest pain: The patient has no significant risk factors other than short history of tobacco abuse and a strong family history with the father and 3 siblings all of whom had ischemic heart disease. We'll her symptoms were somewhat atypical I am swayed by the fact that they were nitrate responsive. She does have a history of bleeding ulcers in the past the last one of which was 7 years ago and her hemoglobin is moderately decreased however she says her symptoms are not similar to her prior GI symptoms. I favor diagnostic coronary arteriography to define her anatomy and rule  out ischemic etiology prior to pursuing a GI workup. I still explained the risks and benefits of cardiac cath including but not limited to death, stroke, vascular injury and renal impairment and the patient agrees to proceed.   RECOMMENDATION: 1. Diagnostic coronary arteriography which will be arranged today at [facility] hopefully to be done radially. If she has significant disease requiring intervention, and in light of her moderately decreased hemoglobin a bare metal stent may be a better option.  Time Spent Directly with Patient: 30 minutes  Nimra Puccinelli J 08/27/2014, 1:31 PM

## 2014-08-27 NOTE — Progress Notes (Signed)
UR completed 

## 2014-08-27 NOTE — Progress Notes (Signed)
We will see in consult first before stress test since she is having active CP.  Barbara FinlayBryan Hulet Ehrmann, [facility]

## 2014-08-27 NOTE — Progress Notes (Signed)
Walked into room to check on patient and arm was starting to bleed. 5ml of air placed back in TR band. Total of 14ml of air in TR band at this time. Site soft with no active bleeding at this time. Will continue to monitor patient.

## 2014-08-27 NOTE — Interval H&P Note (Signed)
History and Physical Interval Note:  08/27/2014 4:02 PM  Barbara [name]  has presented today for surgery, with the diagnosis of cp  The various methods of treatment have been discussed with the patient and family. After consideration of risks, benefits and other options for treatment, the patient has consented to  Procedure(s): LEFT HEART CATHETERIZATION WITH CORONARY ANGIOGRAM (N/A) as a surgical intervention .  The patient's history has been reviewed, patient examined, no change in status, stable for surgery.  I have reviewed the patient's chart and labs.  Questions were answered to the patient's satisfaction.     Lorine BearsMuhammad Arida

## 2014-08-27 NOTE — Consult Note (Signed)
   Reason for Consult: Chest pain  Requesting Physician: Dr. 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 She was admitted yesterday with chest pain which was nitroglycerin responsive. Her cardiac risk factor profile is notable for discontinued tobacco abuse although she only started smoking several years ago. She does have a fairly impressive family history with a father who died of myocardial infarction and 3 out of 4 siblings who have had coronary disease and intervention. She developed chest pain while driving home from work last night. The pain was substernal and radiated to her back. It was relieved with 2 sublingual nitroglycerin. She additionally received analgesics. She does have a history of bleeding ulcers in the past the last one of which was 7 years ago. She says these symptoms are not similar. She does take over-the-counter Prilosec. She was scheduled for a stress test today however she had recurrent chest pain that was nitrate responsive.  Problem List: Patient Active Problem List   Diagnosis Date Noted  . Chest pain 08/26/2014    PMHx:  Past Medical History  Diagnosis Date  . Low iron   . GERD (gastroesophageal reflux disease)    Past Surgical History  Procedure Laterality Date  . Cholecystectomy    . Anterior cruciate ligament repair    . Endometrial ablation      FAMHx: Family History  Problem Relation Age of Onset  . CAD Brother 45    CABG at age 45, and deceased  . CAD Sister 50    1 stent  . CAD Father 50    Deceased in 50s    SOCHx:  reports that she has never smoked. She does not have any smokeless tobacco history on file. She reports that she drinks alcohol. She reports that she does not use illicit drugs.  ALLERGIES: Allergies  Allergen Reactions  . Morphine And Related Itching  . Sulfa Antibiotics Swelling    ROS: Pertinent items are  noted in HPI.  HOME MEDICATIONS: Prescriptions prior to admission  Medication Sig Dispense Refill Last Dose  . cyclobenzaprine (FLEXERIL) 5 MG tablet Take 1 tablet by mouth 3 (three) times daily as needed.  0 08/23/2014  . loratadine (CLARITIN) 10 MG tablet Take 10 mg by mouth daily.   08/26/2014 at Unknown time  . meloxicam (MOBIC) 7.5 MG tablet Take 1 tablet by mouth daily as needed.  0 08/23/2014  . omeprazole (PRILOSEC OTC) 20 MG tablet Take 20 mg by mouth daily.   08/26/2014 at Unknown time    HOSPITAL MEDICATIONS: I have reviewed the patient's current medications.  VITALS: Blood pressure 113/78, pulse 89, temperature 98 F (36.7 C), temperature source Oral, resp. rate 18, height 5' 9" (1.753 m), weight 177 lb 0.5 oz (80.3 kg), SpO2 99 %.  INPUT/OUTPUT I/O last 3 completed shifts: In: 240 [P.O.:240] Out: 600 [Urine:600]      PHYSICAL EXAM: General appearance: alert and no distress Neck: no adenopathy, no carotid bruit, no JVD, supple, symmetrical, trachea midline and thyroid not enlarged, symmetric, no tenderness/mass/nodules Lungs: clear to auscultation bilaterally Heart: regular rate and rhythm, S1, S2 normal, no murmur, click, rub or gallop Extremities: extremities normal, atraumatic, no cyanosis or edema and 2+ pedal pulses bilaterally  LABS:  BMP  Recent Labs  08/26/14 1716  NA 138  K 4.0  CL 104  CO2 25  GLUCOSE 99  BUN 19  CREATININE 0.96    CALCIUM 8.9  GFRNONAA 67*  GFRAA 77*    CBC  Recent Labs Lab 08/26/14 1716  WBC 5.7  RBC 3.72*  HGB 10.5*  HCT 30.3*  PLT 289  MCV 81.5    HEMOGLOBIN A1C No results found for: HGBA1C, MPG  Cardiac Panel (last 3 results)  Recent Labs  08/26/14 2225 08/27/14 0105 08/27/14 0345  TROPONINI <0.03 <0.03 <0.03    BNP (last 3 results) No results for input(s): PROBNP in the last 8760 hours.  TSH No results for input(s): TSH in the last 8760 hours.  CHOLESTEROL No results for input(s): CHOL in the last  8760 hours.  Hepatic Function Panel No results for input(s): PROT, ALBUMIN, AST, ALT, ALKPHOS, BILITOT, BILIDIR, IBILI in the last 8760 hours.  IMAGING: Dg Chest Portable 1 View  08/26/2014   CLINICAL DATA:  Chest pain and difficulty breathing, acute  EXAM: PORTABLE CHEST - 1 VIEW  COMPARISON:  None.  FINDINGS: Lungs are clear. Heart size and pulmonary vascularity are normal. No adenopathy. No pneumothorax. No bone lesions.  IMPRESSION: No edema or consolidation.   Electronically Signed   By: William  Woodruff III M.D.   On: 08/26/2014 17:58   Ct Angio Chest Aorta W/cm &/or Wo/cm  08/26/2014   CLINICAL DATA:  Sudden onset chest pain radiating to back, shortness of breath, light headedness  EXAM: CT ANGIOGRAPHY CHEST, ABDOMEN AND PELVIS  TECHNIQUE: Multidetector CT imaging through the chest, abdomen and pelvis was performed using the standard protocol during bolus administration of intravenous contrast. Multiplanar reconstructed images and MIPs were obtained and reviewed to evaluate the vascular anatomy.  CONTRAST:  100mL OMNIPAQUE IOHEXOL 350 MG/ML SOLN  COMPARISON:  None.  FINDINGS: Unenhanced CT, there is no evidence of intramural hematoma.  No evidence of thoracoabdominal aortic aneurysm or dissection.  Although not tailored for evaluation of the pulmonary arteries, there is no evidence of pulmonary embolism.  Celiac artery, SMA, IMA, renal arteries, iliac arteries are patent.  CT CHEST FINDINGS  Mediastinum/Nodes: The heart is normal in size. No pericardial effusion.  No suspicious mediastinal, hilar, or axillary lymphadenopathy.  Visualized thyroid is unremarkable.  Lungs/Pleura: Lungs are clear.  No focal consolidation.  No suspicious pulmonary nodules.  No pleural effusion or pneumothorax.  Musculoskeletal: No focal osseous lesions.  CT ABDOMEN PELVIS FINDINGS  Hepatobiliary: Liver is notable for focal fat/altered perfusion along the falciform ligament.  Status post cholecystectomy. No intrahepatic or  extrahepatic ductal dilatation.  Pancreas: Within normal limits.  Spleen: Within normal limits.  Adrenals/Urinary Tract: Adrenal glands are within normal limits.  Kidneys within normal limits.  No hydronephrosis.  Bladder is within normal limits.  Stomach/Bowel: Stomach is within normal limits.  No evidence of bowel obstruction.  Normal appendix.  Vascular/Lymphatic: Vascular findings as above.  No suspicious abdominopelvic lymphadenopathy.  Reproductive: Uterus is unremarkable.  Bilateral ovaries are within normal limits.  Other: No abdominopelvic ascites.  Musculoskeletal: Degenerative changes at L5-S1.  Review of the MIP images confirms the above findings.  IMPRESSION: No evidence of thoracoabdominal aortic aneurysm or dissection.  No evidence of pulmonary embolism.  Normal CT chest.  Negative CT abdomen/pelvis.   Electronically Signed   By: Sriyesh  Krishnan M.D.   On: 08/26/2014 20:03   Ct Angio Abd/pel W/ And/or W/o  08/26/2014   CLINICAL DATA:  Sudden onset chest pain radiating to back, shortness of breath, light headedness  EXAM: CT ANGIOGRAPHY CHEST, ABDOMEN AND PELVIS  TECHNIQUE: Multidetector CT imaging through the chest, abdomen and pelvis   was performed using the standard protocol during bolus administration of intravenous contrast. Multiplanar reconstructed images and MIPs were obtained and reviewed to evaluate the vascular anatomy.  CONTRAST:  100mL OMNIPAQUE IOHEXOL 350 MG/ML SOLN  COMPARISON:  None.  FINDINGS: Unenhanced CT, there is no evidence of intramural hematoma.  No evidence of thoracoabdominal aortic aneurysm or dissection.  Although not tailored for evaluation of the pulmonary arteries, there is no evidence of pulmonary embolism.  Celiac artery, SMA, IMA, renal arteries, iliac arteries are patent.  CT CHEST FINDINGS  Mediastinum/Nodes: The heart is normal in size. No pericardial effusion.  No suspicious mediastinal, hilar, or axillary lymphadenopathy.  Visualized thyroid is unremarkable.   Lungs/Pleura: Lungs are clear.  No focal consolidation.  No suspicious pulmonary nodules.  No pleural effusion or pneumothorax.  Musculoskeletal: No focal osseous lesions.  CT ABDOMEN PELVIS FINDINGS  Hepatobiliary: Liver is notable for focal fat/altered perfusion along the falciform ligament.  Status post cholecystectomy. No intrahepatic or extrahepatic ductal dilatation.  Pancreas: Within normal limits.  Spleen: Within normal limits.  Adrenals/Urinary Tract: Adrenal glands are within normal limits.  Kidneys within normal limits.  No hydronephrosis.  Bladder is within normal limits.  Stomach/Bowel: Stomach is within normal limits.  No evidence of bowel obstruction.  Normal appendix.  Vascular/Lymphatic: Vascular findings as above.  No suspicious abdominopelvic lymphadenopathy.  Reproductive: Uterus is unremarkable.  Bilateral ovaries are within normal limits.  Other: No abdominopelvic ascites.  Musculoskeletal: Degenerative changes at L5-S1.  Review of the MIP images confirms the above findings.  IMPRESSION: No evidence of thoracoabdominal aortic aneurysm or dissection.  No evidence of pulmonary embolism.  Normal CT chest.  Negative CT abdomen/pelvis.   Electronically Signed   By: Sriyesh  Krishnan M.D.   On: 08/26/2014 20:03    EKG- normal sinus rhythm with nonspecific ST and T-wave changes. I personally reviewed this EKG  IMPRESSION: 1. Chest pain: The patient has no significant risk factors other than short history of tobacco abuse and a strong family history with the father and 3 siblings all of whom had ischemic heart disease. We'll her symptoms were somewhat atypical I am swayed by the fact that they were nitrate responsive. She does have a history of bleeding ulcers in the past the last one of which was 7 years ago and her hemoglobin is moderately decreased however she says her symptoms are not similar to her prior GI symptoms. I favor diagnostic coronary arteriography to define her anatomy and rule  out ischemic etiology prior to pursuing a GI workup. I still explained the risks and benefits of cardiac cath including but not limited to death, stroke, vascular injury and renal impairment and the patient agrees to proceed.   RECOMMENDATION: 1. Diagnostic coronary arteriography which will be arranged today at [facility] hopefully to be done radially. If she has significant disease requiring intervention, and in light of her moderately decreased hemoglobin a bare metal stent may be a better option.  Time Spent Directly with Patient: 30 minutes  Tearra Ouk J 08/27/2014, 1:31 PM 

## 2014-08-27 NOTE — Progress Notes (Signed)
Progress Note   Barbara [name] ZOX:[id number] DOB: 1960/09/23 DOA: 08/26/2014 PCP: No primary care provider on file.   Brief Narrative:   Barbara [name] is an 54 y.o. female with a PMH of GERD who was admitted 08/26/14 with a chief complaint of a one-hour history of the sudden onset of severe chest pain. She has a positive family history of early coronary disease. Chest x-ray and CT angiogram done on admission were unremarkable. Troponins have been negative. Stress test planned for today.  Assessment/Plan:   Principal Problem: Chest pain - Cardiac markers negative.  EKG negative for ischemic changes. - Given strong FH of heart disease, cardiologist plans to do heart cath rather than stress test. - CT chest negative for PE. - Check FLP to further [name] stratisfy.   - Continue ASA and PRN NTG. - Continue to monitor on tele given ongoing intermittent chest pain.   Active Problems:   GERD - Start Pepcid and add Mylanta PRN.    DVT Prophylaxis  Code Status: Full. Family Communication: No family at the bedside. Disposition Plan: Home when stable.   IV Access:    Peripheral IV   Procedures and diagnostic studies:   Dg Chest Portable 1 View 08/26/2014:  No edema or consolidation.     Ct Angio Chest Aorta W/cm &/or Wo/cm 08/26/2014: No evidence of thoracoabdominal aortic aneurysm or dissection.  No evidence of pulmonary embolism.  Normal CT chest.  Negative CT abdomen/pelvis.     Medical Consultants:    Dr. Jeri Cos, Cardiology.  Anti-Infectives:    None.  Subjective:   Barbara [name] had an episode of chest pain this morning, relieved with NTG.  Now having GERD/reflux symptoms.  Currently chest pain free.  Had nausea with chest pain earlier this a.m.  Objective:    Filed Vitals:   08/26/14 2333 08/26/14 2342 08/26/14 2351 08/27/14 0528  BP: 130/84 125/76 120/78 128/76  Pulse: 80 76 75 82  Temp:    97.9 F (36.6 C)  TempSrc: Oral   Oral  Resp: Height:      Weight:      SpO2: 100% 95% 98% 100%    Intake/Output Summary (Last 24 hours) at 08/27/14 0840 Last data filed at 08/27/14 0549  Gross per 24 hour  Intake      0 ml  Output    600 ml  Net   -600 ml    Exam: Gen:  NAD Cardiovascular:  RRR, No M/R/G Respiratory:  Lungs CTAB Gastrointestinal:  Abdomen soft, NT/ND, + BS Extremities:  No C/E/C   Data Reviewed:    Labs: Basic Metabolic Panel:  Recent Labs Lab 08/26/14 1716  NA 138  K 4.0  CL 104  CO2 25  GLUCOSE 99  BUN 19  CREATININE 0.96  CALCIUM 8.9   GFR Estimated Creatinine Clearance: 77.7 mL/min (by C-G formula based on Cr of 0.96). Liver Function Tests: No results for input(s): AST, ALT, ALKPHOS, BILITOT, PROT, ALBUMIN in the last 168 hours. No results for input(s): LIPASE, AMYLASE in the last 168 hours. No results for input(s): AMMONIA in the last 168 hours.  CBC:  Recent Labs Lab 08/26/14 1716  WBC 5.7  HGB 10.5*  HCT 30.3*  MCV 81.5  PLT 289   Cardiac Enzymes:  Recent Labs Lab 08/26/14 2225 08/27/14 0105 08/27/14 0345  TROPONINI <0.03 <0.03 <0.03   D-Dimer:  Recent Labs  08/26/14 1732  DDIMER <0.27  Lipid Profile: No results for input(s): CHOL, HDL, LDLCALC, TRIG, CHOLHDL, LDLDIRECT in the last 72 hours.  Microbiology No results found for this or any previous visit (from the past 240 hour(s)).   Medications:   . antiseptic oral rinse  7 mL Mouth Rinse q12n4p  . aspirin  325 mg Oral Daily  . chlorhexidine  15 mL Mouth Rinse BID  . heparin  5,000 Units Subcutaneous 3 times per day   Continuous Infusions:   Time spent: 25 minutes.     Lea Walbert  [facility] Pager [phone]. If unable to reach me by pager, please call my cell phone at [phone].  *Please refer to [email or web address], password [email or web address] to get updated schedule on who will round on this patient, as hospitalists switch teams weekly. If 7PM-7AM, please contact night-coverage at [email or web address],  password [email or web address] for any overnight needs.  08/27/2014, 8:40 AM

## 2014-08-27 NOTE — Progress Notes (Signed)
3ml of air removed. No bleeding noted. Total of 9ml of air in TR band at this time.

## 2014-08-27 NOTE — CV Procedure (Signed)
   Cardiac Catheterization Procedure Note  Name: Barbara [name] MRN: [id number] DOB: 01-Feb-1961  Procedure: Left Heart Cath, Selective Coronary Angiography, LV angiography  Indication:  Chest pain possible unstable angina.  Medications:  Sedation:   1 mg IV Versed,  and 50 mcg IV Fentanyl  Contrast:   60 mL Omnipaque   Procedural Details: The right wrist was prepped, draped, and anesthetized with 1% lidocaine. Using the modified Seldinger technique, a 5 French sheath was introduced into the right radial artery. 3 mg of verapamil was administered through the sheath, weight-based unfractionated heparin was administered intravenously. A Jackie catheter was used  for left ventriculography.  standard Judkins catheters were used for coronary angiography.Catheter exchanges were performed over an exchange length guidewire. There were no immediate procedural complications. A TR band was used for radial hemostasis at the completion of the procedure.  The patient was transferred to the post catheterization recovery area for further monitoring.  Procedural Findings:  Hemodynamics: AO:   166/91   mmHg LV:   167 over 4    mmHg LVEDP:  18  mmHg  Coronary angiography: Coronary dominance:  right   Left Main:   normal  Left Anterior Descending (LAD):   Normal in size with no significant disease.  1st diagonal (D1):   normal  2nd diagonal (D2):   Small in size with no significant disease.  3rd diagonal (D3):   Normal in size with no significant disease.  Circumflex (LCx):   Medium in size and nondominant. The vessel has no significant disease.  1st obtuse marginal:   Normal in size with no significant disease.  2nd obtuse marginal:   Small in size with no significant disease.  3rd obtuse marginal:   Normal in size with no significant disease.   Right Coronary Artery:   large in size and dominant. The vessel has minor irregularities.  Posterior descending artelarge in size with no  significant disease.  Posterior AV segment:  normal in size with no significant disease.  Posterolateral branchs:   normal  Left ventriculography: Left ventricular systolic function is  normal , LVEF is estimated at  60 %, there is  no significant mitral regurgitation   Final Conclusions:    1. No significant coronary artery disease. 2. Normal LV systolic function.  Recommendations:   The chest pain is likely noncardiac and possibly GI in nature.  Lorine BearsMuhammad Arida MD, [facility] CenterFACC 08/27/2014, 4:43 PM

## 2014-08-28 ENCOUNTER — Encounter (HOSPITAL_COMMUNITY): Payer: Self-pay | Admitting: Cardiovascular Disease

## 2014-08-28 DIAGNOSIS — R0789 Other chest pain: Secondary | ICD-10-CM | POA: Diagnosis not present

## 2014-08-28 DIAGNOSIS — R079 Chest pain, unspecified: Secondary | ICD-10-CM | POA: Diagnosis not present

## 2014-08-28 DIAGNOSIS — K219 Gastro-esophageal reflux disease without esophagitis: Secondary | ICD-10-CM | POA: Diagnosis not present

## 2014-08-28 MED ORDER — UNABLE TO FIND: Status: DC

## 2014-08-28 MED ORDER — PANTOPRAZOLE SODIUM 40 MG PO TBEC
40.0000 mg | DELAYED_RELEASE_TABLET | Freq: Two times a day (BID) | ORAL | Status: DC
  Administered 2014-08-28: 40 mg via ORAL
  Filled 2014-08-28: qty 1

## 2014-08-28 MED ORDER — PANTOPRAZOLE SODIUM 40 MG PO TBEC: 40.0000 mg | DELAYED_RELEASE_TABLET | Freq: Two times a day (BID) | ORAL | Status: DC

## 2014-08-28 MED ORDER — MELOXICAM 7.5 MG PO TABS: 7.5000 mg | ORAL_TABLET | Freq: Every day | ORAL | Status: DC | PRN

## 2014-08-28 NOTE — Discharge Summary (Signed)
Physician Discharge Summary  Barbara [name] ZOX:[id number] DOB: [birth year] DOA: 08/26/2014  PCP: No primary care provider on file.  Admit date: 08/26/2014 Discharge date: 08/28/2014  Time spent: 45 minutes  Recommendations for Outpatient Follow-up:  1. PCP in 1 week  Discharge Diagnoses:  Principal Problem:   Chest pain Active Problems:   GERD (gastroesophageal reflux disease)   Discharge Condition: stable  Diet recommendation: regular  Filed Weights   08/26/14 1703 08/26/14 2223  Weight: 77.111 kg (170 lb) 80.3 kg (177 lb 0.5 oz)    History of present illness:  Barbara [name] is a 54 y.o. female who presented to the ED with 1 hour history of sudden onset severe chest pain. Described as "like a bomb exploded in her chest".   Hospital Course:  Atypical Chest pain - Cardiac markers negative. EKG negative for ischemic changes. - Given strong FH of heart disease, seen by cardiology and underwent a LHC which showed normal coronaries and normal LV function. - suspect Gi etiology for symptoms,started on PPI  Active Problems:  GERD - advised to stop NSAIDs and started on PPI  Procedures:  Left Heart Cath  Consultations:  Cardiology  Discharge Exam: Filed Vitals:   08/28/14 0358  BP: 121/78  Pulse: 75  Temp: 98.4 F (36.9 C)  Resp: 18    General: AAOx3 Cardiovascular: S1s2/RRR Respiratory: CTAB  Discharge Instructions   Discharge Instructions    Diet general    Complete by:  As directed      Increase activity slowly    Complete by:  As directed           Discharge Medication List as of 08/28/2014 10:04 AM    START taking these medications   Details  pantoprazole (PROTONIX) 40 MG tablet Take 1 tablet (40 mg total) by mouth 2 (two) times daily. Take twice a day for 1 month and then once a day, Starting 08/28/2014, Until Discontinued, Normal      CONTINUE these medications which have CHANGED   Details  meloxicam (MOBIC) 7.5 MG tablet Take 1 tablet (7.5  mg total) by mouth daily as needed. Avoid/Limit use if possible, Starting 08/28/2014, Until Discontinued, No Print      CONTINUE these medications which have NOT CHANGED   Details  cyclobenzaprine (FLEXERIL) 5 MG tablet Take 1 tablet by mouth 3 (three) times daily as needed., Starting 08/14/2014, Until Discontinued, Historical Med    loratadine (CLARITIN) 10 MG tablet Take 10 mg by mouth daily., Until Discontinued, Historical Med      STOP taking these medications     omeprazole (PRILOSEC OTC) 20 MG tablet        Allergies  Allergen Reactions  . Morphine And Related Itching  . Sulfa Antibiotics Swelling      The results of significant diagnostics from this hospitalization (including imaging, microbiology, ancillary and laboratory) are listed below for reference.    Significant Diagnostic Studies: Dg Chest Portable 1 View  08/26/2014   CLINICAL DATA:  Chest pain and difficulty breathing, acute  EXAM: PORTABLE CHEST - 1 VIEW  COMPARISON:  None.  FINDINGS: Lungs are clear. Heart size and pulmonary vascularity are normal. No adenopathy. No pneumothorax. No bone lesions.  IMPRESSION: No edema or consolidation.   Electronically Signed   By: Bretta Bang III M.D.   On: 08/26/2014 17:58   Ct Angio Chest Aorta W/cm &/or Wo/cm  08/26/2014   CLINICAL DATA:  Sudden onset chest pain radiating to back, shortness of breath, light headedness  EXAM: CT ANGIOGRAPHY CHEST, ABDOMEN AND PELVIS  TECHNIQUE: Multidetector CT imaging through the chest, abdomen and pelvis was performed using the standard protocol during bolus administration of intravenous contrast. Multiplanar reconstructed images and MIPs were obtained and reviewed to evaluate the vascular anatomy.  CONTRAST:  100mL OMNIPAQUE IOHEXOL 350 MG/ML SOLN  COMPARISON:  None.  FINDINGS: Unenhanced CT, there is no evidence of intramural hematoma.  No evidence of thoracoabdominal aortic aneurysm or dissection.  Although not tailored for evaluation of  the pulmonary arteries, there is no evidence of pulmonary embolism.  Celiac artery, SMA, IMA, renal arteries, iliac arteries are patent.  CT CHEST FINDINGS  Mediastinum/Nodes: The heart is normal in size. No pericardial effusion.  No suspicious mediastinal, hilar, or axillary lymphadenopathy.  Visualized thyroid is unremarkable.  Lungs/Pleura: Lungs are clear.  No focal consolidation.  No suspicious pulmonary nodules.  No pleural effusion or pneumothorax.  Musculoskeletal: No focal osseous lesions.  CT ABDOMEN PELVIS FINDINGS  Hepatobiliary: Liver is notable for focal fat/altered perfusion along the falciform ligament.  Status post cholecystectomy. No intrahepatic or extrahepatic ductal dilatation.  Pancreas: Within normal limits.  Spleen: Within normal limits.  Adrenals/Urinary Tract: Adrenal glands are within normal limits.  Kidneys within normal limits.  No hydronephrosis.  Bladder is within normal limits.  Stomach/Bowel: Stomach is within normal limits.  No evidence of bowel obstruction.  Normal appendix.  Vascular/Lymphatic: Vascular findings as above.  No suspicious abdominopelvic lymphadenopathy.  Reproductive: Uterus is unremarkable.  Bilateral ovaries are within normal limits.  Other: No abdominopelvic ascites.  Musculoskeletal: Degenerative changes at L5-S1.  Review of the MIP images confirms the above findings.  IMPRESSION: No evidence of thoracoabdominal aortic aneurysm or dissection.  No evidence of pulmonary embolism.  Normal CT chest.  Negative CT abdomen/pelvis.   Electronically Signed   By: Charline BillsSriyesh  Krishnan M.D.   On: 08/26/2014 20:03   Ct Angio Abd/pel W/ And/or W/o  08/26/2014   CLINICAL DATA:  Sudden onset chest pain radiating to back, shortness of breath, light headedness  EXAM: CT ANGIOGRAPHY CHEST, ABDOMEN AND PELVIS  TECHNIQUE: Multidetector CT imaging through the chest, abdomen and pelvis was performed using the standard protocol during bolus administration of intravenous contrast.  Multiplanar reconstructed images and MIPs were obtained and reviewed to evaluate the vascular anatomy.  CONTRAST:  100mL OMNIPAQUE IOHEXOL 350 MG/ML SOLN  COMPARISON:  None.  FINDINGS: Unenhanced CT, there is no evidence of intramural hematoma.  No evidence of thoracoabdominal aortic aneurysm or dissection.  Although not tailored for evaluation of the pulmonary arteries, there is no evidence of pulmonary embolism.  Celiac artery, SMA, IMA, renal arteries, iliac arteries are patent.  CT CHEST FINDINGS  Mediastinum/Nodes: The heart is normal in size. No pericardial effusion.  No suspicious mediastinal, hilar, or axillary lymphadenopathy.  Visualized thyroid is unremarkable.  Lungs/Pleura: Lungs are clear.  No focal consolidation.  No suspicious pulmonary nodules.  No pleural effusion or pneumothorax.  Musculoskeletal: No focal osseous lesions.  CT ABDOMEN PELVIS FINDINGS  Hepatobiliary: Liver is notable for focal fat/altered perfusion along the falciform ligament.  Status post cholecystectomy. No intrahepatic or extrahepatic ductal dilatation.  Pancreas: Within normal limits.  Spleen: Within normal limits.  Adrenals/Urinary Tract: Adrenal glands are within normal limits.  Kidneys within normal limits.  No hydronephrosis.  Bladder is within normal limits.  Stomach/Bowel: Stomach is within normal limits.  No evidence of bowel obstruction.  Normal appendix.  Vascular/Lymphatic: Vascular findings as above.  No suspicious abdominopelvic lymphadenopathy.  Reproductive: Uterus  is unremarkable.  Bilateral ovaries are within normal limits.  Other: No abdominopelvic ascites.  Musculoskeletal: Degenerative changes at L5-S1.  Review of the MIP images confirms the above findings.  IMPRESSION: No evidence of thoracoabdominal aortic aneurysm or dissection.  No evidence of pulmonary embolism.  Normal CT chest.  Negative CT abdomen/pelvis.   Electronically Signed   By: Charline Bills M.D.   On: 08/26/2014 20:03     Microbiology: No results found for this or any previous visit (from the past 240 hour(s)).   Labs: Basic Metabolic Panel:  Recent Labs Lab 08/26/14 1716  NA 138  K 4.0  CL 104  CO2 25  GLUCOSE 99  BUN 19  CREATININE 0.96  CALCIUM 8.9   Liver Function Tests: No results for input(s): AST, ALT, ALKPHOS, BILITOT, PROT, ALBUMIN in the last 168 hours. No results for input(s): LIPASE, AMYLASE in the last 168 hours. No results for input(s): AMMONIA in the last 168 hours. CBC:  Recent Labs Lab 08/26/14 1716  WBC 5.7  HGB 10.5*  HCT 30.3*  MCV 81.5  PLT 289   Cardiac Enzymes:  Recent Labs Lab 08/26/14 2225 08/27/14 0105 08/27/14 0345  TROPONINI <0.03 <0.03 <0.03   BNP: BNP (last 3 results) No results for input(s): BNP in the last 8760 hours.  ProBNP (last 3 results) No results for input(s): PROBNP in the last 8760 hours.  CBG: No results for input(s): GLUCAP in the last 168 hours.     SignedZannie Cove  [facility] 08/28/2014, 3:00 PM

## 2014-08-28 NOTE — Progress Notes (Signed)
Pt given discharge instructions, medication lists, follow up appointments, and when to call the doctor.  Pt verbalizes understanding. Pt given signs and symptoms of infection.  Pt transported to main entrance for pickup. Thomas HoffBurton, [place] Elena McClintock, RN

## 2014-12-03 ENCOUNTER — Emergency Department (HOSPITAL_COMMUNITY)
Admission: EM | Admit: 2014-12-03 | Discharge: 2014-12-03 | Disposition: A | Discharge status: Home / Self Care | Payer: BLUE CROSS/BLUE SHIELD | Attending: Emergency Medicine | Admitting: Emergency Medicine

## 2014-12-03 ENCOUNTER — Emergency Department (HOSPITAL_COMMUNITY): Payer: BLUE CROSS/BLUE SHIELD

## 2014-12-03 ENCOUNTER — Encounter (HOSPITAL_COMMUNITY): Payer: Self-pay | Admitting: Emergency Medicine

## 2014-12-03 DIAGNOSIS — Z9889 Other specified postprocedural states: Secondary | ICD-10-CM | POA: Insufficient documentation

## 2014-12-03 DIAGNOSIS — Z87891 Personal history of nicotine dependence: Secondary | ICD-10-CM | POA: Diagnosis not present

## 2014-12-03 DIAGNOSIS — Z862 Personal history of diseases of the blood and blood-forming organs and certain disorders involving the immune mechanism: Secondary | ICD-10-CM | POA: Diagnosis not present

## 2014-12-03 DIAGNOSIS — R202 Paresthesia of skin: Secondary | ICD-10-CM | POA: Insufficient documentation

## 2014-12-03 DIAGNOSIS — M6289 Other specified disorders of muscle: Secondary | ICD-10-CM | POA: Diagnosis not present

## 2014-12-03 DIAGNOSIS — K219 Gastro-esophageal reflux disease without esophagitis: Secondary | ICD-10-CM | POA: Insufficient documentation

## 2014-12-03 DIAGNOSIS — M6281 Muscle weakness (generalized): Secondary | ICD-10-CM | POA: Diagnosis not present

## 2014-12-03 DIAGNOSIS — Z79899 Other long term (current) drug therapy: Secondary | ICD-10-CM | POA: Insufficient documentation

## 2014-12-03 DIAGNOSIS — M25511 Pain in right shoulder: Secondary | ICD-10-CM | POA: Insufficient documentation

## 2014-12-03 DIAGNOSIS — M546 Pain in thoracic spine: Secondary | ICD-10-CM | POA: Insufficient documentation

## 2014-12-03 DIAGNOSIS — R29898 Other symptoms and signs involving the musculoskeletal system: Secondary | ICD-10-CM

## 2014-12-03 DIAGNOSIS — R200 Anesthesia of skin: Secondary | ICD-10-CM | POA: Diagnosis not present

## 2014-12-03 HISTORY — DX: Sickle-cell disease without crisis: D57.1

## 2014-12-03 LAB — COMPREHENSIVE METABOLIC PANEL
ALT: 31 U/L (ref 14–54)
AST: 21 U/L (ref 15–41)
Albumin: 3.6 g/dL (ref 3.5–5.0)
Alkaline Phosphatase: 102 U/L (ref 38–126)
Anion gap: 8 (ref 5–15)
BUN: 12 mg/dL (ref 6–20)
CO2: 25 mmol/L (ref 22–32)
CREATININE: 0.89 mg/dL (ref 0.44–1.00)
Calcium: 9.4 mg/dL (ref 8.9–10.3)
Chloride: 106 mmol/L (ref 101–111)
GLUCOSE: 106 mg/dL — AB (ref 65–99)
Potassium: 4 mmol/L (ref 3.5–5.1)
Sodium: 139 mmol/L (ref 135–145)
TOTAL PROTEIN: 7.1 g/dL (ref 6.5–8.1)
Total Bilirubin: <0.1 mg/dL — ABNORMAL LOW (ref 0.3–1.2)

## 2014-12-03 LAB — DIFFERENTIAL
BASOS PCT: 0 % (ref 0–1)
Basophils Absolute: 0 10*3/uL (ref 0.0–0.1)
Eosinophils Absolute: 0.1 10*3/uL (ref 0.0–0.7)
Eosinophils Relative: 1 % (ref 0–5)
LYMPHS ABS: 2.6 10*3/uL (ref 0.7–4.0)
Lymphocytes Relative: 41 % (ref 12–46)
Monocytes Absolute: 0.4 10*3/uL (ref 0.1–1.0)
Monocytes Relative: 6 % (ref 3–12)
NEUTROS ABS: 3.2 10*3/uL (ref 1.7–7.7)
Neutrophils Relative %: 52 % (ref 43–77)

## 2014-12-03 LAB — URINALYSIS, ROUTINE W REFLEX MICROSCOPIC
BILIRUBIN URINE: NEGATIVE
Glucose, UA: NEGATIVE mg/dL
HGB URINE DIPSTICK: NEGATIVE
Ketones, ur: NEGATIVE mg/dL
NITRITE: NEGATIVE
PROTEIN: NEGATIVE mg/dL
Specific Gravity, Urine: 1.004 — ABNORMAL LOW (ref 1.005–1.030)
UROBILINOGEN UA: 0.2 mg/dL (ref 0.0–1.0)
pH: 7 (ref 5.0–8.0)

## 2014-12-03 LAB — I-STAT CHEM 8, ED
BUN: 14 mg/dL (ref 6–20)
CALCIUM ION: 1.21 mmol/L (ref 1.12–1.23)
Chloride: 104 mmol/L (ref 101–111)
Creatinine, Ser: 0.9 mg/dL (ref 0.44–1.00)
Glucose, Bld: 100 mg/dL — ABNORMAL HIGH (ref 65–99)
HEMATOCRIT: 36 % (ref 36.0–46.0)
HEMOGLOBIN: 12.2 g/dL (ref 12.0–15.0)
POTASSIUM: 4 mmol/L (ref 3.5–5.1)
SODIUM: 140 mmol/L (ref 135–145)
TCO2: 24 mmol/L (ref 0–100)

## 2014-12-03 LAB — URINE MICROSCOPIC-ADD ON

## 2014-12-03 LAB — CBC
HCT: 33 % — ABNORMAL LOW (ref 36.0–46.0)
HEMOGLOBIN: 11.6 g/dL — AB (ref 12.0–15.0)
MCH: 28.9 pg (ref 26.0–34.0)
MCHC: 35.2 g/dL (ref 30.0–36.0)
MCV: 82.3 fL (ref 78.0–100.0)
Platelets: 262 10*3/uL (ref 150–400)
RBC: 4.01 MIL/uL (ref 3.87–5.11)
RDW: 14 % (ref 11.5–15.5)
WBC: 6.3 10*3/uL (ref 4.0–10.5)

## 2014-12-03 LAB — PROTIME-INR
INR: 0.98 (ref 0.00–1.49)
PROTHROMBIN TIME: 13.2 s (ref 11.6–15.2)

## 2014-12-03 LAB — I-STAT TROPONIN, ED
TROPONIN I, POC: 0 ng/mL (ref 0.00–0.08)
Troponin i, poc: 0 ng/mL (ref 0.00–0.08)

## 2014-12-03 LAB — RAPID URINE DRUG SCREEN, HOSP PERFORMED
AMPHETAMINES: NOT DETECTED
Barbiturates: NOT DETECTED
Benzodiazepines: NOT DETECTED
COCAINE: NOT DETECTED
Opiates: NOT DETECTED
Tetrahydrocannabinol: NOT DETECTED

## 2014-12-03 LAB — ETHANOL

## 2014-12-03 LAB — APTT: aPTT: 30 seconds (ref 24–37)

## 2014-12-03 MED ORDER — HYDROCORTISONE SOD SUCCINATE 100 MG PF FOR IT USE: 200.0000 mg | Freq: Once | INTRAMUSCULAR | Status: DC

## 2014-12-03 MED ORDER — GADOBENATE DIMEGLUMINE 529 MG/ML IV SOLN
15.0000 mL | Freq: Once | INTRAVENOUS | Status: AC | PRN
  Administered 2014-12-03: 15 mL via INTRAVENOUS

## 2014-12-03 MED ORDER — METHYLPREDNISOLONE SODIUM SUCC 125 MG IJ SOLR
125.0000 mg | Freq: Once | INTRAMUSCULAR | Status: AC
  Administered 2014-12-03: 125 mg via INTRAVENOUS
  Filled 2014-12-03: qty 2

## 2014-12-03 MED ORDER — HYDROMORPHONE HCL 1 MG/ML IJ SOLN
1.0000 mg | Freq: Once | INTRAMUSCULAR | Status: AC
  Administered 2014-12-03: 1 mg via INTRAVENOUS
  Filled 2014-12-03: qty 1

## 2014-12-03 MED ORDER — SODIUM CHLORIDE 0.9 % IV BOLUS (SEPSIS)
1000.0000 mL | Freq: Once | INTRAVENOUS | Status: AC
  Administered 2014-12-03: 1000 mL via INTRAVENOUS

## 2014-12-03 MED ORDER — DIPHENHYDRAMINE HCL 50 MG/ML IJ SOLN
50.0000 mg | Freq: Once | INTRAMUSCULAR | Status: AC
  Administered 2014-12-03: 50 mg via INTRAVENOUS
  Filled 2014-12-03: qty 1

## 2014-12-03 MED ORDER — IOHEXOL 350 MG/ML SOLN
100.0000 mL | Freq: Once | INTRAVENOUS | Status: AC | PRN
  Administered 2014-12-03: 100 mL via INTRAVENOUS

## 2014-12-03 NOTE — ED Provider Notes (Signed)
CSN: [id number]     Arrival date & time 12/03/14  1050 History   First MD Initiated Contact with Patient 12/03/14 1052     Chief Complaint  Patient presents with  . Shoulder Pain     (Consider location/radiation/quality/duration/timing/severity/associated sxs/prior Treatment) Patient is a 54 y.o. female presenting with back pain and neurologic complaint.  Back Pain Pain location: upper right thoracic back. Quality:  Stabbing Radiates to:  R shoulder Pain severity:  Severe Onset quality:  Sudden Duration:  1 hour Timing:  Constant Progression:  Unchanged Chronicity:  New (had similar episode in april) Context: not falling and not recent injury   Relieved by:  Nothing Worsened by:  Nothing tried Ineffective treatments:  None tried Associated symptoms: numbness (tingling in right arm and leg), paresthesias (right arm and leg) and weakness (right leg)   Associated symptoms: no abdominal pain, no chest pain, no fever and no headaches   Neurologic Problem This is a new problem. The current episode started less than 1 hour ago. The problem occurs constantly. The problem has not changed since onset.Pertinent negatives include no chest pain, no abdominal pain, no headaches and no shortness of breath. Nothing aggravates the symptoms. Nothing relieves the symptoms. She has tried nothing for the symptoms. The treatment provided no relief.    Past Medical History  Diagnosis Date  . Low iron   . GERD (gastroesophageal reflux disease)   . Sickle cell anemia    Past Surgical History  Procedure Laterality Date  . Cholecystectomy    . Anterior cruciate ligament repair    . Endometrial ablation    . Left heart catheterization with coronary angiogram N/A 08/27/2014    Procedure: LEFT HEART CATHETERIZATION WITH CORONARY ANGIOGRAM;  Surgeon: Iran OuchMuhammad A Arida, MD;  Location: [facility] CATH [facility];  Service: Cardiovascular;  Laterality: N/A;   Family History  Problem Relation Age of Onset  . CAD Brother  7245    CABG at age 53, and deceased  . CAD Sister 50    1 stent  . CAD Father 3250    Deceased in 8350s   History  Substance Use Topics  . Smoking status: Former Smoker -- 1 years    Types: Cigarettes    Quit date: 08/26/2013  . Smokeless tobacco: Not on file  . Alcohol Use: Yes     Comment: occasionally, glass of wine   OB History    No data available     Review of Systems  Constitutional: Negative for fever.  HENT: Negative for sore throat.   Eyes: Negative for visual disturbance.  Respiratory: Negative for cough and shortness of breath.   Cardiovascular: Negative for chest pain.  Gastrointestinal: Negative for nausea, vomiting, abdominal pain and diarrhea.  Genitourinary: Negative for difficulty urinating.  Musculoskeletal: Positive for back pain (pleuritic right mid back pain). Negative for neck pain.  Skin: Negative for rash.  Neurological: Positive for weakness (right leg), numbness (tingling in right arm and leg) and paresthesias (right arm and leg). Negative for syncope and headaches.      Allergies  Iodine; Morphine and related; and Sulfa antibiotics  Home Medications   Prior to Admission medications   Medication Sig Start Date End Date Taking? Authorizing Provider  calcium carbonate (TUMS - DOSED IN MG ELEMENTAL CALCIUM) 500 MG chewable tablet Chew 1 tablet by mouth daily as needed for indigestion or heartburn.   Yes Historical Provider, MD  Ibuprofen-Diphenhydramine HCl (ADVIL PM) 200-25 MG CAPS Take 1 capsule by mouth at  bedtime as needed and may repeat dose one time if needed (pain, sleep).   Yes Historical Provider, MD  loratadine (CLARITIN) 10 MG tablet Take 10 mg by mouth daily.   Yes Historical Provider, MD  meloxicam (MOBIC) 7.5 MG tablet Take 1 tablet (7.5 mg total) by mouth daily as needed. Avoid/Limit use if possible Patient not taking: Reported on 12/03/2014 08/28/14   Zannie Cove, MD  pantoprazole (PROTONIX) 40 MG tablet Take 1 tablet (40 mg total) by  mouth 2 (two) times daily. Take twice a day for 1 month and then once a day Patient not taking: Reported on 12/03/2014 08/28/14   Zannie Cove, MD  UNABLE TO FIND This note is to excuse Ms.Pickart from work 4/5-4/7 due to medical illness requiring hospitalization Patient not taking: Reported on 12/03/2014 08/28/14   Zannie Cove, MD   BP 144/86 mmHg  Pulse 86  Temp(Src) 97.9 F (36.6 C) (Oral)  Resp 19  SpO2 99% Physical Exam  Constitutional: She is oriented to person, place, and time. She appears well-developed and well-nourished. No distress.  HENT:  Head: Normocephalic and atraumatic.  Eyes: Conjunctivae and EOM are normal.  Neck: Normal range of motion.  Cardiovascular: Normal rate, regular rhythm, normal heart sounds and intact distal pulses.  Exam reveals no gallop and no friction rub.   No murmur heard. Pulmonary/Chest: Effort normal and breath sounds normal. No respiratory distress. She has no wheezes. She has no rales.  Abdominal: Soft. She exhibits no distension. There is no tenderness. There is no guarding.  Musculoskeletal: She exhibits no edema or tenderness.  Neurological: She is alert and oriented to person, place, and time. A sensory deficit (reports altered sensation on right extremities) is present. No cranial nerve deficit. Coordination normal. GCS eye subscore is 4. GCS verbal subscore is 5. GCS motor subscore is 6.  No upper or lower extremity drift, however with active strength testing, pt with weakness RLE 4/5 compared to LLE 5/5  Skin: Skin is warm and dry. No rash noted. She is not diaphoretic. No erythema.  Nursing note and vitals reviewed.   ED Course  Procedures (including critical care time) Labs Review Labs Reviewed  CBC - Abnormal; Notable for the following:    Hemoglobin 11.6 (*)    HCT 33.0 (*)    All other components within normal limits  COMPREHENSIVE METABOLIC PANEL - Abnormal; Notable for the following:    Glucose, Bld 106 (*)    Total  Bilirubin <0.1 (*)    All other components within normal limits  URINALYSIS, ROUTINE W REFLEX MICROSCOPIC (NOT AT [facility]) - Abnormal; Notable for the following:    Specific Gravity, Urine 1.004 (*)    Leukocytes, UA TRACE (*)    All other components within normal limits  URINE MICROSCOPIC-ADD ON - Abnormal; Notable for the following:    Squamous Epithelial / LPF FEW (*)    All other components within normal limits  I-STAT CHEM 8, ED - Abnormal; Notable for the following:    Glucose, Bld 100 (*)    All other components within normal limits  ETHANOL  PROTIME-INR  APTT  DIFFERENTIAL  URINE RAPID DRUG SCREEN, HOSP PERFORMED  I-STAT TROPOININ, ED  I-STAT TROPOININ, ED    Imaging Review Ct Head Wo Contrast  12/03/2014   CLINICAL DATA:  Acute onset right-sided numbness and tingling  EXAM: CT HEAD WITHOUT CONTRAST  TECHNIQUE: Contiguous axial images were obtained from the base of the skull through the vertex without intravenous contrast.  COMPARISON:  None.  FINDINGS: The ventricles are normal in size and configuration. There is no intracranial mass, hemorrhage, extra-axial fluid collection, or midline shift. The gray-white compartments are normal. There is no evident acute infarct. Middle cerebral arteries show normal attenuation bilaterally. The bony calvarium appears intact. The mastoid air cells are clear.  IMPRESSION: Study within normal limits. No intracranial mass, hemorrhage, or focal gray - white compartment lesions/acute appearing infarct.  Critical Value/emergent results were called by telephone at the time of interpretation on 12/03/2014 at 11:39 am to Dr. Thad Ranger, neurology, who verbally acknowledged these results.   Electronically Signed   By: Bretta Bang III M.D.   On: 12/03/2014 11:41   Mr Laqueta Jean WU Contrast  12/03/2014   CLINICAL DATA:  Right hand weakness beginning 5 hr ago.  EXAM: MRI HEAD WITHOUT AND WITH CONTRAST  TECHNIQUE: Multiplanar, multiecho pulse sequences of the  brain and surrounding structures were obtained without and with intravenous contrast.  CONTRAST:  15mL MULTIHANCE GADOBENATE DIMEGLUMINE 529 MG/ML IV SOLN  COMPARISON:  Head CT earlier same day.  FINDINGS: The brain has a normal appearance on all pulse sequences without evidence of malformation, atrophy, old or acute infarction, mass lesion, hemorrhage, hydrocephalus or extra-axial collection. No pituitary mass. No fluid in the sinuses, middle ears or mastoids. No skull or skullbase lesion. There is flow in the major vessels at the base of the brain. Major venous sinuses show flow. After contrast administration, no abnormal enhancement occurs.  IMPRESSION: Normal examination.  No old or acute pathologic finding.   Electronically Signed   By: Paulina Fusi M.D.   On: 12/03/2014 15:27   Ct Angio Chest Aorta W/cm &/or Wo/cm  12/03/2014   CLINICAL DATA:  Right-sided tingling and weakness. RIGHT shoulder pain. Radiation down the RIGHT arm. Numbness in the RIGHT leg.  EXAM: CT ANGIOGRAPHY CHEST, ABDOMEN AND PELVIS  TECHNIQUE: Multidetector CT imaging through the chest, abdomen and pelvis was performed using the standard protocol during bolus administration of intravenous contrast. Multiplanar reconstructed images and MIPs were obtained and reviewed to evaluate the vascular anatomy.  CONTRAST:  OMNIPAQUE IOHEXOL 350 MG/ML SOLN  COMPARISON:  CTA of the chest 08/26/2014.  FINDINGS: CTA CHEST FINDINGS  Bones: No aggressive osseous lesions. Thoracic vertebral body height is preserved.  Cardiovascular: No acute aortic abnormality. Heart grossly appears normal. Negative for thoracic dissection or aneurysm. Precontrast imaging is normal.  Lungs: Dependent atelectasis.  No airspace disease or effusion.  Central airways: Patent.  Effusions: None.  Lymphadenopathy: None.  Esophagus: Normal.  Upper abdomen:  Other: None.  Review of the MIP images confirms the above findings.  CTA ABDOMEN AND PELVIS FINDINGS  Musculoskeletal:  L5-S1 spondylosis. Grade I retrolisthesis of L5 on S1 associated with collapse of the disc space. L4-L5 degenerative disc disease.  Lung Bases:  Liver:  Normal.  Spleen:  Normal.  Gallbladder:  Surgically absent.  Common bile duct:  Normal.  Pancreas:  Normal.  Adrenal glands:  Normal bilaterally.  Kidneys:  Normal renal enhancement.  Both ureters appear normal.  Stomach:  Normal.  Small bowel:  Normal.  Colon:   Normal appendix.  No inflammatory changes of colon.  Pelvic Genitourinary: Physiologic appearance of the uterus and adnexa. Urinary bladder appears normal.  Peritoneum: No free air or free fluid.  Vascular/lymphatic: No acute abnormality. Negative for aneurysm. Abdominal aorta and mesenteric vessels appear within normal limits.  Body Wall: Tiny fat containing periumbilical hernia.  Review of the MIP images confirms  the above findings.  IMPRESSION: No acute vascular abnormality. Normal CT of the chest, abdomen and pelvis. Cholecystectomy.   Electronically Signed   By: Andreas Newport M.D.   On: 12/03/2014 14:28   Ct Cta Abd/pel W/cm &/or W/o Cm  12/03/2014   CLINICAL DATA:  Right-sided tingling and weakness. RIGHT shoulder pain. Radiation down the RIGHT arm. Numbness in the RIGHT leg.  EXAM: CT ANGIOGRAPHY CHEST, ABDOMEN AND PELVIS  TECHNIQUE: Multidetector CT imaging through the chest, abdomen and pelvis was performed using the standard protocol during bolus administration of intravenous contrast. Multiplanar reconstructed images and MIPs were obtained and reviewed to evaluate the vascular anatomy.  CONTRAST:  OMNIPAQUE IOHEXOL 350 MG/ML SOLN  COMPARISON:  CTA of the chest 08/26/2014.  FINDINGS: CTA CHEST FINDINGS  Bones: No aggressive osseous lesions. Thoracic vertebral body height is preserved.  Cardiovascular: No acute aortic abnormality. Heart grossly appears normal. Negative for thoracic dissection or aneurysm. Precontrast imaging is normal.  Lungs: Dependent atelectasis.  No airspace disease  or effusion.  Central airways: Patent.  Effusions: None.  Lymphadenopathy: None.  Esophagus: Normal.  Upper abdomen:  Other: None.  Review of the MIP images confirms the above findings.  CTA ABDOMEN AND PELVIS FINDINGS  Musculoskeletal: L5-S1 spondylosis. Grade I retrolisthesis of L5 on S1 associated with collapse of the disc space. L4-L5 degenerative disc disease.  Lung Bases:  Liver:  Normal.  Spleen:  Normal.  Gallbladder:  Surgically absent.  Common bile duct:  Normal.  Pancreas:  Normal.  Adrenal glands:  Normal bilaterally.  Kidneys:  Normal renal enhancement.  Both ureters appear normal.  Stomach:  Normal.  Small bowel:  Normal.  Colon:   Normal appendix.  No inflammatory changes of colon.  Pelvic Genitourinary: Physiologic appearance of the uterus and adnexa. Urinary bladder appears normal.  Peritoneum: No free air or free fluid.  Vascular/lymphatic: No acute abnormality. Negative for aneurysm. Abdominal aorta and mesenteric vessels appear within normal limits.  Body Wall: Tiny fat containing periumbilical hernia.  Review of the MIP images confirms the above findings.  IMPRESSION: No acute vascular abnormality. Normal CT of the chest, abdomen and pelvis. Cholecystectomy.   Electronically Signed   By: Andreas Newport M.D.   On: 12/03/2014 14:28     EKG Interpretation   Date/Time:  Wednesday December 03 2014 11:05:33 EDT Ventricular Rate:  96 PR Interval:  140 QRS Duration: 84 QT Interval:  407 QTC Calculation: 514 R Axis:   56 Text Interpretation:  Sinus rhythm Probable left atrial enlargement  Borderline repolarization abnormality Prolonged QT interval No significant  change since last tracing Confirmed by DOCHERTY  MD, MEGAN [id number]) on  12/03/2014 11:21:11 AM      MDM   Final diagnoses:  Transient right leg weakness  Right-sided thoracic back pain  Numbness and tingling of right arm and leg   54 year old female with a history of GERD, family history of coronary artery disease,  presentation in April 2016 with sharp right-sided chest pain for which she had a dissection protocol performed as well as a coronary catheterization which was negative for coronary disease. Patient again presents with sharp central back pain that is pleuritic with numbness of the right arm, tingling of the right leg, and weakness of the right leg. Patient was last normal at 10 AM. Patient with mild right lower extremity weakness on exam and given presence of this a code stroke was called.  CT head revealed no acute abnormalities. Patient reports a history of  facial swelling with IV contrast however had received previously in April following pretreatment and was given Benadryl and Solu-Medrol in and had a CT chest abdomen and pelvis to evaluate for dissection given stabbing pain radiating from the back and neurologic symptoms. Patient's CT A did not show any evidence of pulmonary embolus or dissection.  Neurology recommended MRI brain which was also negative for acute disease. Given patient's family history of coronary artery disease and possibility right arm pain may represent atypical ACS that delta i-STAT troponins were ordered which were also negative.    Unclear cause at this time for patient's pain and neurologic symptoms however there are no signs of stroke, aortic dissection, PE, ACS.  Low suspicion for TIA given pain and Neurology's evaluation. Recommend continued outpatient evaluation. Patient discharged in stable condition with understanding of reasons to return.    Alvira Monday, MD 12/03/14 [phone]

## 2014-12-03 NOTE — ED Notes (Signed)
Ct informed that pt has received her premedications. Will wait at least 1 hour.

## 2014-12-03 NOTE — ED Notes (Signed)
MD at bedside. 

## 2014-12-03 NOTE — ED Notes (Signed)
MD at bedside discussing allergy

## 2014-12-03 NOTE — ED Notes (Signed)
After speaking with MD, MRI was notified that pt will go to MRI after CT due to receiving premedications for scan

## 2014-12-03 NOTE — ED Notes (Signed)
Pt sts right shoulder pain with radiation down right arm with numbness that is also having pain in right leg; pt sts some nausea due to pain

## 2014-12-03 NOTE — ED Notes (Signed)
MD made aware that pt reports an allergy to Iodine. CT aware as well.

## 2014-12-03 NOTE — Progress Notes (Signed)
Per Lowella BandyNikki RN verbal from Dr. Dalene SeltzerSchlossman scan without creatinine and GFR right after code stroke for dissection.

## 2014-12-03 NOTE — Consult Note (Signed)
Referring Physician: schlossman    Chief Complaint: right sided weakness and right sided decreased sensation  HPI:                                                                                                                                         Barbara [name] is an 54 y.o. female who awoke and went to the bathroom at 0700 with no difficulties.  She went back to her bed and was on her lab top. At 1000 she was having difficulties with her right hand and then dropped her lap top.  She went to stand up and felt as thought her right leg was weak. At the same time she had pain in her right shoulder that spread to between her shoulder blades. patient presented to ED and Code Stroke was called. At time of exam her main complaint was right face and leg decreased sensation but no weakness.   Date last known well: Date: 12/03/2014 Time last known well: Time: 07:00 tPA Given: No: minimal symptoms Modified Rankin: Rankin Score=0    Past Medical History  Diagnosis Date  . Low iron   . GERD (gastroesophageal reflux disease)     Past Surgical History  Procedure Laterality Date  . Cholecystectomy    . Anterior cruciate ligament repair    . Endometrial ablation    . Left heart catheterization with coronary angiogram N/A 08/27/2014    Procedure: LEFT HEART CATHETERIZATION WITH CORONARY ANGIOGRAM;  Surgeon: Iran Ouch, MD;  Location: [facility];  Service: Cardiovascular;  Laterality: N/A;    Family History  Problem Relation Age of Onset  . CAD Brother 66    CABG at age 22, and deceased  . CAD Sister 50    1 stent  . CAD Father 41    Deceased in 66s   Social History:  reports that she quit smoking about 15 months ago. Her smoking use included Cigarettes. She quit after 1 year of use. She does not have any smokeless tobacco history on file. She reports that she drinks alcohol. She reports that she does not use illicit drugs.  Allergies:  Allergies  Allergen Reactions  . Iodine Swelling   . Morphine And Related Itching  . Sulfa Antibiotics Swelling    Medications:  No current facility-administered medications for this encounter.   Current Outpatient Prescriptions  Medication Sig Dispense Refill  . calcium carbonate (TUMS - DOSED IN MG ELEMENTAL CALCIUM) 500 MG chewable tablet Chew 1 tablet by mouth daily as needed for indigestion or heartburn.    . Ibuprofen-Diphenhydramine HCl (ADVIL PM) 200-25 MG CAPS Take 1 capsule by mouth at bedtime as needed and may repeat dose one time if needed (pain, sleep).    . loratadine (CLARITIN) 10 MG tablet Take 10 mg by mouth daily.    . meloxicam (MOBIC) 7.5 MG tablet Take 1 tablet (7.5 mg total) by mouth daily as needed. Avoid/Limit use if possible (Patient not taking: Reported on 12/03/2014)  0  . pantoprazole (PROTONIX) 40 MG tablet Take 1 tablet (40 mg total) by mouth 2 (two) times daily. Take twice a day for 1 month and then once a day (Patient not taking: Reported on 12/03/2014) 60 tablet 0  . UNABLE TO FIND This note is to excuse Ms.Nordgren from work 4/5-4/7 due to medical illness requiring hospitalization (Patient not taking: Reported on 12/03/2014) 1 each 0     ROS:                                                                                                                                       History obtained from the patient  General ROS: negative for - chills, fatigue, fever, night sweats, weight gain or weight loss Psychological ROS: negative for - behavioral disorder, hallucinations, memory difficulties, mood swings or suicidal ideation Ophthalmic ROS: negative for - blurry vision, double vision, eye pain or loss of vision ENT ROS: negative for - epistaxis, nasal discharge, oral lesions, sore throat, tinnitus or vertigo Allergy and Immunology ROS: negative for - hives or itchy/watery eyes Hematological  and Lymphatic ROS: negative for - bleeding problems, bruising or swollen lymph nodes Endocrine ROS: negative for - galactorrhea, hair pattern changes, polydipsia/polyuria or temperature intolerance Respiratory ROS: negative for - cough, hemoptysis, shortness of breath or wheezing Cardiovascular ROS: negative for - chest pain, dyspnea on exertion, edema or irregular heartbeat Gastrointestinal ROS: negative for - abdominal pain, diarrhea, hematemesis, nausea/vomiting or stool incontinence Genito-Urinary ROS: negative for - dysuria, hematuria, incontinence or urinary frequency/urgency Musculoskeletal ROS: negative for - joint swelling or muscular weakness Neurological ROS: as noted in HPI Dermatological ROS: negative for rash and skin lesion changes  Neurologic Examination:  Blood pressure 149/84, pulse 83, temperature 98.1 F (36.7 C), temperature source Oral, resp. rate 14, SpO2 100 %.  HEENT-  Normocephalic, no lesions, without obvious abnormality.  Normal external eye and conjunctiva.  Normal TM's bilaterally.  Normal auditory canals and external ears. Normal external nose, mucus membranes and septum.  Normal pharynx. Cardiovascular- S1, S2 normal, pulses palpable throughout   Lungs- chest clear, no wheezing, rales, normal symmetric air entry Abdomen- normal findings: bowel sounds normal Extremities- no edema Lymph-no adenopathy palpable Musculoskeletal-no joint tenderness, deformity or swelling Skin-warm and dry, no hyperpigmentation, vitiligo, or suspicious lesions  Neurological Examination Mental Status: Alert, oriented, thought content appropriate.  Speech fluent without evidence of aphasia.  Able to follow 3 step commands without difficulty. Cranial Nerves: II: Discs flat bilaterally; Visual fields grossly normal, pupils equal, round, reactive to light and accommodation III,IV, VI:  ptosis not present, extra-ocular motions intact bilaterally V,VII: smile symmetric, facial light touch sensation normal bilaterally VIII: hearing normal bilaterally IX,X: uvula rises symmetrically XI: bilateral shoulder shrug XII: midline tongue extension Motor: Right : Upper extremity   5/5    Left:     Upper extremity   5/5  Lower extremity   5/5     Lower extremity   5/5 Tone and bulk:normal tone throughout; no atrophy noted Sensory: Pinprick and light touch was noted to be decreased on the right face and leg Deep Tendon Reflexes: 2+ and symmetric throughout Plantars: Right: downgoing   Left: downgoing Cerebellar: normal finger-to-nose,  and normal heel-to-shin test Gait: not tested for patient safety       Lab Results: Basic Metabolic Panel:  Recent Labs Lab 12/03/14 1120 12/03/14 1124  NA 139 140  K 4.0 4.0  CL 106 104  CO2 25  --   GLUCOSE 106* 100*  BUN 12 14  CREATININE 0.89 0.90  CALCIUM 9.4  --     Liver Function Tests:  Recent Labs Lab 12/03/14 1120  AST 21  ALT 31  ALKPHOS 102  BILITOT <0.1*  PROT 7.1  ALBUMIN 3.6   No results for input(s): LIPASE, AMYLASE in the last 168 hours. No results for input(s): AMMONIA in the last 168 hours.  CBC:  Recent Labs Lab 12/03/14 1120 12/03/14 1124  WBC 6.3  --   NEUTROABS 3.2  --   HGB 11.6* 12.2  HCT 33.0* 36.0  MCV 82.3  --   PLT 262  --     Cardiac Enzymes: No results for input(s): CKTOTAL, CKMB, CKMBINDEX, TROPONINI in the last 168 hours.  Lipid Panel: No results for input(s): CHOL, TRIG, HDL, CHOLHDL, VLDL, LDLCALC in the last 168 hours.  CBG: No results for input(s): GLUCAP in the last 168 hours.  Microbiology: No results found for this or any previous visit.  Coagulation Studies:  Recent Labs  12/03/14 1120  LABPROT 13.2  INR 0.98    Imaging: Ct Head Wo Contrast  12/03/2014   CLINICAL DATA:  Acute onset right-sided numbness and tingling  EXAM: CT HEAD WITHOUT CONTRAST   TECHNIQUE: Contiguous axial images were obtained from the base of the skull through the vertex without intravenous contrast.  COMPARISON:  None.  FINDINGS: The ventricles are normal in size and configuration. There is no intracranial mass, hemorrhage, extra-axial fluid collection, or midline shift. The gray-white compartments are normal. There is no evident acute infarct. Middle cerebral arteries show normal attenuation bilaterally. The bony calvarium appears intact. The mastoid air cells are clear.  IMPRESSION: Study within normal limits.  No intracranial mass, hemorrhage, or focal gray - white compartment lesions/acute appearing infarct.  Critical Value/emergent results were called by telephone at the time of interpretation on 12/03/2014 at 11:39 am to Dr. Thad Ranger, neurology, who verbally acknowledged these results.   Electronically Signed   By: Bretta Bang III M.D.   On: 12/03/2014 11:41    Felicie Morn PA-C [facility] [phone]  12/03/2014, 12:58 PM   Patient seen and examined.  Clinical course and management discussed.  Necessary edits performed.  I agree with the above.  Assessment and plan of care developed and discussed below.   Assessment: 54 y.o. female presenting with right sided pain and weakness.  Weakness completely resolved during evaluation but patient continued to have pain.  Head CT reviewed and shows no acute changes.  Patient without vascular risk factors.    Stroke Risk Factors - none  Recommendations: 1.  MRI of the brain without contrast.  If MRI unremarkable no further stroke work up recommended at this time and would continue to evaluate for complaint of pain.    Thana Farr, MD [facility] [phone]  12/03/2014  1:00 PM

## 2014-12-03 NOTE — Code Documentation (Signed)
54[facility].  Patient reports that she was sitting in bed on her laptop when she suddenly lost control of the laptop which fell to the floor at 1000.  She reports right sharp shoulder pain and tingling.  She attempted to get up and noticed right leg weakness.  Patient presented to the ED where a Code Stroke was called.  Stroke team to the bedside.  Patient to CT.  NIHSS 1, see documentation for details and code stroke times.  Patient reports decreased sensation in the right face and leg.  Patient reports that she got up at 0700 and was at her baseline at that time.  Patient is outside the window for treatment with tPA at this time.  Bedside handoff with ED RN Joni ReiningNicole.

## 2014-12-03 NOTE — Discharge Instructions (Signed)
Arthralgia Your caregiver has diagnosed you as suffering from an arthralgia. Arthralgia means there is pain in a joint. This can come from many reasons including:  Bruising the joint which causes soreness (inflammation) in the joint.  Wear and tear on the joints which occur as we grow older (osteoarthritis).  Overusing the joint.  Various forms of arthritis.  Infections of the joint. Regardless of the cause of pain in your joint, most of these different pains respond to anti-inflammatory drugs and rest. The exception to this is when a joint is infected, and these cases are treated with antibiotics, if it is a bacterial infection. HOME CARE INSTRUCTIONS   Rest the injured area for as long as directed by your caregiver. Then slowly start using the joint as directed by your caregiver and as the pain allows. Crutches as directed may be useful if the ankles, knees or hips are involved. If the knee was splinted or casted, continue use and care as directed. If an stretchy or elastic wrapping bandage has been applied today, it should be removed and re-applied every 3 to 4 hours. It should not be applied tightly, but firmly enough to keep swelling down. Watch toes and feet for swelling, bluish discoloration, coldness, numbness or excessive pain. If any of these problems (symptoms) occur, remove the ace bandage and re-apply more loosely. If these symptoms persist, contact your caregiver or return to this location.  For the first 24 hours, keep the injured extremity elevated on pillows while lying down.  Apply ice for 15-20 minutes to the sore joint every couple hours while awake for the first half day. Then 03-04 times per day for the first 48 hours. Put the ice in a plastic bag and place a towel between the bag of ice and your skin.  Wear any splinting, casting, elastic bandage applications, or slings as instructed.  Only take over-the-counter or prescription medicines for pain, discomfort, or  fever as directed by your caregiver. Do not use aspirin immediately after the injury unless instructed by your physician. Aspirin can cause increased bleeding and bruising of the tissues.  If you were given crutches, continue to use them as instructed and do not resume weight bearing on the sore joint until instructed. Persistent pain and inability to use the sore joint as directed for more than 2 to 3 days are warning signs indicating that you should see a caregiver for a follow-up visit as soon as possible. Initially, a hairline fracture (break in bone) may not be evident on X-rays. Persistent pain and swelling indicate that further evaluation, non-weight bearing or use of the joint (use of crutches or slings as instructed), or further X-rays are indicated. X-rays may sometimes not show a small fracture until a week or 10 days later. Make a follow-up appointment with your own caregiver or one to whom we have referred you. A radiologist (specialist in reading X-rays) may read your X-rays. Make sure you know how you are to obtain your X-ray results. Do not assume everything is normal if you do not hear from [place]. SEEK MEDICAL CARE IF: Bruising, swelling, or pain increases. SEEK IMMEDIATE MEDICAL CARE IF:   Your fingers or toes are numb or blue.  The pain is not responding to medications and continues to stay the same or get worse.  The pain in your joint becomes severe.  You develop a fever over 102 F (38.9 C).  It becomes impossible to move or use the joint. MAKE SURE YOU:  Understand these instructions.  Will watch your condition.  Will get help right away if you are not doing well or get worse. Document Released: 05/09/2005 Document Revised: 08/01/2011 Document Reviewed: 12/26/2007 [facility] Patient Information 2015 [employer], [place]. This information is not intended to replace advice given to you by your health care provider. Make sure you discuss any questions you have with your health  care provider.  Back Exercises Back exercises help treat and prevent back injuries. The goal of back exercises is to increase the strength of your abdominal and back muscles and the flexibility of your back. These exercises should be started when you no longer have back pain. Back exercises include:  Pelvic Tilt. Lie on your back with your knees bent. Tilt your pelvis until the lower part of your back is against the floor. Hold this position 5 to 10 sec and repeat 5 to 10 times.  Knee to Chest. Pull first 1 knee up against your chest and hold for 20 to 30 seconds, repeat this with the other knee, and then both knees. This may be done with the other leg straight or bent, whichever feels better.  Sit-Ups or Curl-Ups. Bend your knees 90 degrees. Start with tilting your pelvis, and do a partial, slow sit-up, lifting your trunk only 30 to 45 degrees off the floor. Take at least 2 to 3 seconds for each sit-up. Do not do sit-ups with your knees out straight. If partial sit-ups are difficult, simply do the above but with only tightening your abdominal muscles and holding it as directed.  Hip-Lift. Lie on your back with your knees flexed 90 degrees. Push down with your feet and shoulders as you raise your hips a couple inches off the floor; hold for 10 seconds, repeat 5 to 10 times.  Back arches. Lie on your stomach, propping yourself up on bent elbows. Slowly press on your hands, causing an arch in your low back. Repeat 3 to 5 times. Any initial stiffness and discomfort should lessen with repetition over time.  Shoulder-Lifts. Lie face down with arms beside your body. Keep hips and torso pressed to floor as you slowly lift your head and shoulders off the floor. Do not overdo your exercises, especially in the beginning. Exercises may cause you some mild back discomfort which lasts for a few minutes; however, if the pain is more severe, or lasts for more than 15 minutes, do not continue exercises until you see  your caregiver. Improvement with exercise therapy for back problems is slow.  See your caregivers for assistance with developing a proper back exercise program. Document Released: 06/16/2004 Document Revised: 08/01/2011 Document Reviewed: 03/10/2011 [facility] Patient Information 2015 [place], [employer] [place]. This information is not intended to replace advice given to you by your health care provider. Make sure you discuss any questions you have with your health care provider.

## 2014-12-11 ENCOUNTER — Emergency Department (HOSPITAL_COMMUNITY): Payer: BLUE CROSS/BLUE SHIELD

## 2014-12-11 ENCOUNTER — Encounter (HOSPITAL_COMMUNITY): Payer: Self-pay | Admitting: Emergency Medicine

## 2014-12-11 ENCOUNTER — Emergency Department (HOSPITAL_COMMUNITY)
Admission: EM | Admit: 2014-12-11 | Discharge: 2014-12-11 | Disposition: A | Discharge status: Home / Self Care | Payer: BLUE CROSS/BLUE SHIELD | Attending: Emergency Medicine | Admitting: Emergency Medicine

## 2014-12-11 DIAGNOSIS — R079 Chest pain, unspecified: Secondary | ICD-10-CM | POA: Insufficient documentation

## 2014-12-11 DIAGNOSIS — Z87891 Personal history of nicotine dependence: Secondary | ICD-10-CM | POA: Diagnosis not present

## 2014-12-11 DIAGNOSIS — K219 Gastro-esophageal reflux disease without esophagitis: Secondary | ICD-10-CM | POA: Insufficient documentation

## 2014-12-11 DIAGNOSIS — Z862 Personal history of diseases of the blood and blood-forming organs and certain disorders involving the immune mechanism: Secondary | ICD-10-CM | POA: Insufficient documentation

## 2014-12-11 DIAGNOSIS — Z79899 Other long term (current) drug therapy: Secondary | ICD-10-CM | POA: Insufficient documentation

## 2014-12-11 LAB — CBC
HCT: 30.6 % — ABNORMAL LOW (ref 36.0–46.0)
Hemoglobin: 10.7 g/dL — ABNORMAL LOW (ref 12.0–15.0)
MCH: 27.9 pg (ref 26.0–34.0)
MCHC: 35 g/dL (ref 30.0–36.0)
MCV: 79.7 fL (ref 78.0–100.0)
Platelets: 262 10*3/uL (ref 150–400)
RBC: 3.84 MIL/uL — AB (ref 3.87–5.11)
RDW: 14 % (ref 11.5–15.5)
WBC: 6.2 10*3/uL (ref 4.0–10.5)

## 2014-12-11 LAB — BASIC METABOLIC PANEL
Anion gap: 7 (ref 5–15)
BUN: 8 mg/dL (ref 6–20)
CALCIUM: 9 mg/dL (ref 8.9–10.3)
CO2: 24 mmol/L (ref 22–32)
Chloride: 109 mmol/L (ref 101–111)
Creatinine, Ser: 0.83 mg/dL (ref 0.44–1.00)
GFR calc Af Amer: >60 mL/min (ref >60)
GFR calc non Af Amer: >60 mL/min (ref >60)
GLUCOSE: 108 mg/dL — AB (ref 65–99)
POTASSIUM: 4 mmol/L (ref 3.5–5.1)
SODIUM: 140 mmol/L (ref 135–145)

## 2014-12-11 LAB — I-STAT TROPONIN, ED
TROPONIN I, POC: 0 ng/mL (ref 0.00–0.08)
Troponin i, poc: 0 ng/mL (ref 0.00–0.08)

## 2014-12-11 MED ORDER — PANTOPRAZOLE SODIUM 40 MG PO TBEC
40.0000 mg | DELAYED_RELEASE_TABLET | Freq: Once | ORAL | Status: AC
Start: 2014-12-11 — End: 2014-12-11
  Administered 2014-12-11: 40 mg via ORAL
  Filled 2014-12-11: qty 1

## 2014-12-11 MED ORDER — ONDANSETRON HCL 4 MG/2ML IJ SOLN
4.0000 mg | Freq: Once | INTRAMUSCULAR | Status: AC
  Administered 2014-12-11: 4 mg via INTRAVENOUS
  Filled 2014-12-11: qty 2

## 2014-12-11 MED ORDER — ASPIRIN 81 MG PO CHEW: 324.0000 mg | CHEWABLE_TABLET | Freq: Once | ORAL | Status: DC

## 2014-12-11 MED ORDER — SUCRALFATE 1 G PO TABS
1.0000 g | ORAL_TABLET | Freq: Once | ORAL | Status: AC
  Administered 2014-12-11: 1 g via ORAL
  Filled 2014-12-11: qty 1

## 2014-12-11 MED ORDER — HYDROCODONE-ACETAMINOPHEN 5-325 MG PO TABS: 1.0000 | ORAL_TABLET | Freq: Four times a day (QID) | ORAL | Status: DC | PRN

## 2014-12-11 MED ORDER — GI COCKTAIL ~~LOC~~
30.0000 mL | Freq: Once | ORAL | Status: AC
  Administered 2014-12-11: 30 mL via ORAL
  Filled 2014-12-11: qty 30

## 2014-12-11 MED ORDER — HYDROMORPHONE HCL 1 MG/ML IJ SOLN
1.0000 mg | Freq: Once | INTRAMUSCULAR | Status: AC
  Administered 2014-12-11: 1 mg via INTRAVENOUS
  Filled 2014-12-11: qty 1

## 2014-12-11 MED ORDER — ONDANSETRON 4 MG PO TBDP: 4.0000 mg | ORAL_TABLET | Freq: Four times a day (QID) | ORAL | Status: DC | PRN

## 2014-12-11 MED ORDER — NITROGLYCERIN 0.4 MG SL SUBL: 0.4000 mg | SUBLINGUAL_TABLET | SUBLINGUAL | Status: DC | PRN

## 2014-12-11 NOTE — ED Notes (Signed)
Patient is alert and orientedx4.  Patient was explained discharge instructions and they understood them with no questions.  The patient's daughter, Percival Spanish is taking the patient home.

## 2014-12-11 NOTE — ED Provider Notes (Signed)
CSN: [id number]     Arrival date & time 12/11/14  1556 History   First MD Initiated Contact with Patient 12/11/14 1556     Chief Complaint  Patient presents with  . Chest Pain    The patient said she started having chest pain since this morning at 1000.  She thought it was indigestion and so she took two tums and it has gotten worse.     (Consider location/radiation/quality/duration/timing/severity/associated sxs/prior Treatment) Patient is a 54 y.o. female presenting with chest pain. The history is provided by the patient.  Chest Pain Pain location:  Substernal area Pain quality: sharp   Pain radiates to:  Neck Pain radiates to the back: no   Pain severity:  Severe Onset quality:  Sudden Timing:  Constant Progression:  Unchanged Chronicity:  Recurrent Context: at rest   Relieved by:  Nothing Worsened by:  Nothing tried Associated symptoms: no cough, no fever and no shortness of breath     Past Medical History  Diagnosis Date  . Low iron   . GERD (gastroesophageal reflux disease)   . Sickle cell anemia    Past Surgical History  Procedure Laterality Date  . Cholecystectomy    . Anterior cruciate ligament repair    . Endometrial ablation    . Left heart catheterization with coronary angiogram N/A 08/27/2014    Procedure: LEFT HEART CATHETERIZATION WITH CORONARY ANGIOGRAM;  Surgeon: Iran Ouch, MD;  Location: [facility];  Service: Cardiovascular;  Laterality: N/A;   Family History  Problem Relation Age of Onset  . CAD Brother 3    CABG at age 28, and deceased  . CAD Sister 50    1 stent  . CAD Father 25    Deceased in 24s   History  Substance Use Topics  . Smoking status: Former Smoker -- 1 years    Types: Cigarettes    Quit date: 08/26/2013  . Smokeless tobacco: Not on file  . Alcohol Use: Yes     Comment: occasionally, glass of wine   OB History    No data available     Review of Systems  Constitutional: Negative for fever.  Respiratory: Negative  for cough and shortness of breath.   Cardiovascular: Positive for chest pain.  All other systems reviewed and are negative.     Allergies  Iodine; Morphine and related; and Sulfa antibiotics  Home Medications   Prior to Admission medications   Medication Sig Start Date End Date Taking? Authorizing Provider  calcium carbonate (TUMS - DOSED IN MG ELEMENTAL CALCIUM) 500 MG chewable tablet Chew 1 tablet by mouth daily as needed for indigestion or heartburn.    Historical Provider, MD  Ibuprofen-Diphenhydramine HCl (ADVIL PM) 200-25 MG CAPS Take 1 capsule by mouth at bedtime as needed and may repeat dose one time if needed (pain, sleep).    Historical Provider, MD  loratadine (CLARITIN) 10 MG tablet Take 10 mg by mouth daily.    Historical Provider, MD  meloxicam (MOBIC) 7.5 MG tablet Take 1 tablet (7.5 mg total) by mouth daily as needed. Avoid/Limit use if possible Patient not taking: Reported on 12/03/2014 08/28/14   Zannie Cove, MD  pantoprazole (PROTONIX) 40 MG tablet Take 1 tablet (40 mg total) by mouth 2 (two) times daily. Take twice a day for 1 month and then once a day Patient not taking: Reported on 12/03/2014 08/28/14   Zannie Cove, MD  UNABLE TO FIND This note is to excuse Ms.Marchetta from work  4/5-4/7 due to medical illness requiring hospitalization Patient not taking: Reported on 12/03/2014 08/28/14   Zannie Cove, MD   BP 139/82 mmHg  Pulse 88  Temp(Src) 98.3 F (36.8 C) (Oral)  Resp 13  SpO2 100% Physical Exam  Constitutional: She is oriented to person, place, and time. She appears well-developed and well-nourished. No distress.  HENT:  Head: Normocephalic and atraumatic.  Mouth/Throat: Oropharynx is clear and moist.  Eyes: EOM are normal. Pupils are equal, round, and reactive to light.  Neck: Normal range of motion. Neck supple.  Cardiovascular: Normal rate and regular rhythm.  Exam reveals no friction rub.   No murmur heard. Pulmonary/Chest: Effort normal and breath  sounds normal. No respiratory distress. She has no wheezes. She has no rales.  Abdominal: Soft. She exhibits no distension. There is no tenderness. There is no rebound.  Musculoskeletal: Normal range of motion. She exhibits no edema.  Neurological: She is alert and oriented to person, place, and time.  Skin: No rash noted. She is not diaphoretic.  Nursing note and vitals reviewed.   ED Course  Procedures (including critical care time) Labs Review Labs Reviewed  CBC  BASIC METABOLIC PANEL  I-STAT TROPOININ, ED    Imaging Review No results found.   EKG Interpretation   Date/Time:  Thursday December 11 2014 16:01:04 EDT Ventricular Rate:  78 PR Interval:  144 QRS Duration: 85 QT Interval:  384 QTC Calculation: 437 R Axis:   29 Text Interpretation:  Sinus rhythm Probable left atrial enlargement  Borderline T abnormalities, inferior leads No significant change since  last tracing Confirmed by Gwendolyn Grant  MD, [place] Yeats ([id number]) on 12/11/2014 4:07:49 PM      MDM   Final diagnoses:  Chest pain, unspecified chest pain type    54 year old female here with chest pain. Began at 10 AM when driving to work. She thought it was indigestion but had no relief with times. Should no relief with nitroglycerin from EMS. She was given 5 doses of nitroglycerin and 325 mg aspirin. She had a recent cardiac cath and CT aortogram in April of this year. Cath was normal. CT of her aorta was normal. She had a normal cardiac workup.  Here she stated it feels tearing stuff up inside her chest. Since this was similar to last time, we'll do troponins, chest x-ray. GI cocktail given.  Patient had CT Aorta scan that was negative for PE about 8 days ago for similar pain. I do not feel she needs a PE scan at this time as she's had 2 presentations and a full workup for this pain and she has never had a blood clot.   Negative serial troponins. Will give pain meds, nausea meds. Stable for discharge.  Elwin Mocha,  MD 12/11/14 [phone]

## 2014-12-11 NOTE — ED Notes (Signed)
[place], MD at bedside. 

## 2014-12-11 NOTE — ED Notes (Signed)
The patient said she started having chest pain since this morning at 1000.  She thought it was indigestion and so she took two tums and it has gotten worse.   The patient's supervisor called 911, [facility] brought her to [facility] ED per patient request.  They placed an IV, 18 in the left AC gave 5/SL nitros,  of Aspirin and  of Zofran.  Patient was complaining of nausea and SOB.  No other symptoms. The patient just had a Cardiac Cath in April.  Her chest pain has gotten worse.  She rates it 9/10.

## 2014-12-11 NOTE — Discharge Instructions (Signed)

## 2016-07-20 DIAGNOSIS — Z6824 Body mass index (BMI) 24.0-24.9, adult: Secondary | ICD-10-CM | POA: Diagnosis not present

## 2016-07-20 DIAGNOSIS — Z13220 Encounter for screening for lipoid disorders: Secondary | ICD-10-CM | POA: Diagnosis not present

## 2016-07-20 DIAGNOSIS — Z136 Encounter for screening for cardiovascular disorders: Secondary | ICD-10-CM | POA: Diagnosis not present

## 2016-07-20 DIAGNOSIS — Z131 Encounter for screening for diabetes mellitus: Secondary | ICD-10-CM | POA: Diagnosis not present

## 2016-07-20 DIAGNOSIS — Z713 Dietary counseling and surveillance: Secondary | ICD-10-CM | POA: Diagnosis not present

## 2016-08-15 ENCOUNTER — Emergency Department (HOSPITAL_BASED_OUTPATIENT_CLINIC_OR_DEPARTMENT_OTHER): Payer: BLUE CROSS/BLUE SHIELD

## 2016-08-15 ENCOUNTER — Encounter (HOSPITAL_BASED_OUTPATIENT_CLINIC_OR_DEPARTMENT_OTHER): Payer: Self-pay | Admitting: Emergency Medicine

## 2016-08-15 ENCOUNTER — Emergency Department (HOSPITAL_BASED_OUTPATIENT_CLINIC_OR_DEPARTMENT_OTHER)
Admission: EM | Admit: 2016-08-15 | Discharge: 2016-08-15 | Disposition: A | Discharge status: Home / Self Care | Payer: BLUE CROSS/BLUE SHIELD | Attending: Emergency Medicine | Admitting: Emergency Medicine

## 2016-08-15 DIAGNOSIS — R1013 Epigastric pain: Secondary | ICD-10-CM | POA: Diagnosis not present

## 2016-08-15 DIAGNOSIS — R1012 Left upper quadrant pain: Secondary | ICD-10-CM | POA: Diagnosis present

## 2016-08-15 DIAGNOSIS — Z87891 Personal history of nicotine dependence: Secondary | ICD-10-CM | POA: Diagnosis not present

## 2016-08-15 DIAGNOSIS — N3000 Acute cystitis without hematuria: Secondary | ICD-10-CM | POA: Diagnosis not present

## 2016-08-15 DIAGNOSIS — K2900 Acute gastritis without bleeding: Secondary | ICD-10-CM | POA: Insufficient documentation

## 2016-08-15 MED ORDER — GI COCKTAIL ~~LOC~~
30.0000 mL | Freq: Once | ORAL | Status: AC
  Administered 2016-08-15: 30 mL via ORAL
  Filled 2016-08-15: qty 30

## 2016-08-15 MED ORDER — KETOROLAC TROMETHAMINE 60 MG/2ML IM SOLN
60.0000 mg | Freq: Once | INTRAMUSCULAR | Status: AC
  Administered 2016-08-15: 60 mg via INTRAMUSCULAR
  Filled 2016-08-15: qty 2

## 2016-08-15 MED ORDER — PANTOPRAZOLE SODIUM 40 MG PO TBEC
40.0000 mg | DELAYED_RELEASE_TABLET | Freq: Once | ORAL | Status: AC
  Administered 2016-08-15: 40 mg via ORAL
  Filled 2016-08-15: qty 1

## 2016-08-15 MED ORDER — SUCRALFATE 1 GM/10ML PO SUSP: 1.0000 g | Freq: Three times a day (TID) | ORAL | 0 refills | Status: DC

## 2016-08-15 MED ORDER — SUCRALFATE 1 G PO TABS
1.0000 g | ORAL_TABLET | Freq: Three times a day (TID) | ORAL | Status: DC
  Administered 2016-08-15: 1 g via ORAL
  Filled 2016-08-15: qty 1

## 2016-08-15 MED ORDER — FAMOTIDINE 20 MG PO TABS
20.0000 mg | ORAL_TABLET | Freq: Once | ORAL | Status: AC
  Administered 2016-08-15: 20 mg via ORAL
  Filled 2016-08-15: qty 1

## 2016-08-15 MED ORDER — DICYCLOMINE HCL 10 MG/ML IM SOLN
20.0000 mg | Freq: Once | INTRAMUSCULAR | Status: AC
  Administered 2016-08-15: 20 mg via INTRAMUSCULAR
  Filled 2016-08-15: qty 2

## 2016-08-15 MED ORDER — DICYCLOMINE HCL 20 MG PO TABS: 20.0000 mg | ORAL_TABLET | Freq: Two times a day (BID) | ORAL | 0 refills | Status: DC

## 2016-08-15 MED ORDER — PANTOPRAZOLE SODIUM 40 MG PO TBEC
40.0000 mg | DELAYED_RELEASE_TABLET | Freq: Every day | ORAL | 0 refills | Status: DC
Start: 2016-08-15 — End: 2017-10-27

## 2016-08-15 NOTE — ED Notes (Signed)
Alert, NAD, calm, interactive, resps e/u, speaking in clear complete sentences, no dyspnea noted, skin W&D. 

## 2016-08-15 NOTE — ED Triage Notes (Signed)
c/ mid upper epigastric abd pain, thru to back, onset 2200, 9/10, also nausea (denies: recent ETOH, vd, constipation, fever, bleeding, sob, urinary sx or vaginal sx, or dizziness), last ate 1400, h/o similar with upper GI bleeding, "h/o peptic ulcers", no relief with ibuprofen or carafate. "did not try her omeprazole".

## 2016-08-15 NOTE — ED Notes (Signed)
Dr. Palumbo in to room. 

## 2016-08-15 NOTE — ED Notes (Signed)
ED Provider at bedside. 

## 2016-08-15 NOTE — ED Notes (Signed)
To x-ray via stretcher.

## 2016-08-15 NOTE — ED Provider Notes (Addendum)
[facility] Provider [facility]   CSN: [id number] Arrival date & time: 08/15/16  0555     History   Chief Complaint Chief Complaint  Patient presents with  . Abdominal Pain    HPI Barbara [name] is a 56 y.o. female.  The history is provided by the patient.  Abdominal Pain   This is a recurrent problem. The current episode started 3 to 5 hours ago. The problem occurs constantly. The problem has not changed since onset.Associated with: history of gastritis. The pain is located in the LUQ. The quality of the pain is cramping. The pain is severe. Pertinent negatives include anorexia, fever, belching, diarrhea, flatus, hematochezia, melena, nausea, vomiting, dysuria, frequency, hematuria, headaches, arthralgias and myalgias. Nothing aggravates the symptoms. Nothing relieves the symptoms. Past workup includes GI consult and CT scan. Past workup comments: biopsies which were negative for H. Pylori. Her past medical history is significant for GERD. Her past medical history does not include PUD or ulcerative colitis.  Patient states she took her meds but they did not work.  They are expired.    Past Medical History:  Diagnosis Date  . GERD (gastroesophageal reflux disease)   . Low iron   . Sickle cell anemia [facility])     Patient Active Problem List   Diagnosis Date Noted  . GERD (gastroesophageal reflux disease) 08/27/2014  . Chest pain 08/26/2014    Past Surgical History:  Procedure Laterality Date  . ANTERIOR CRUCIATE LIGAMENT REPAIR    . CHOLECYSTECTOMY    . ENDOMETRIAL ABLATION    . LEFT HEART CATHETERIZATION WITH CORONARY ANGIOGRAM N/A 08/27/2014   Procedure: LEFT HEART CATHETERIZATION WITH CORONARY ANGIOGRAM;  Surgeon: Iran Ouch, MD;  Location: [facility];  Service: Cardiovascular;  Laterality: N/A;    OB History    No data available       Home Medications    Prior to Admission medications   Medication Sig Start Date End Date Taking? Authorizing Provider    calcium carbonate (TUMS - DOSED IN MG ELEMENTAL CALCIUM) 500 MG chewable tablet Chew 1 tablet by mouth daily as needed for indigestion or heartburn.    Historical Provider, MD  HYDROcodone-acetaminophen (NORCO/VICODIN) 5-325 MG per tablet Take 1 tablet by mouth every 6 (six) hours as needed for moderate pain. 12/11/14   Elwin Mocha, MD  Ibuprofen-Diphenhydramine HCl (ADVIL PM) 200-25 MG CAPS Take 1 capsule by mouth at bedtime as needed and may repeat dose one time if needed (pain, sleep).    Historical Provider, MD  loratadine (CLARITIN) 10 MG tablet Take 10 mg by mouth daily.    Historical Provider, MD  meloxicam (MOBIC) 7.5 MG tablet Take 1 tablet (7.5 mg total) by mouth daily as needed. Avoid/Limit use if possible Patient not taking: Reported on 12/03/2014 08/28/14   Zannie Cove, MD  omeprazole (PRILOSEC) 20 MG capsule Take 20 mg by mouth daily.    Historical Provider, MD  ondansetron (ZOFRAN ODT) 4 MG disintegrating tablet Take 1 tablet (4 mg total) by mouth every 6 (six) hours as needed for nausea or vomiting. 12/11/14   Elwin Mocha, MD  pantoprazole (PROTONIX) 40 MG tablet Take 1 tablet (40 mg total) by mouth 2 (two) times daily. Take twice a day for 1 month and then once a day Patient not taking: Reported on 12/03/2014 08/28/14   Zannie Cove, MD    Family History Family History  Problem Relation Age of Onset  . CAD Brother 58    CABG at  age 55, and deceased  . CAD Sister 50    1 stent  . CAD Father 350    Deceased in 2650s    Social History Social History  Substance Use Topics  . Smoking status: Former Smoker    Years: 1.00    Types: Cigarettes    Quit date: 08/26/2013  . Smokeless tobacco: Never Used  . Alcohol use Yes     Comment: occasionally, glass of wine     Allergies   Iodine; Morphine and related; and Sulfa antibiotics   Review of Systems Review of Systems  Constitutional: Negative for appetite change and fever.  Respiratory: Negative for shortness of breath.    Cardiovascular: Negative for chest pain.  Gastrointestinal: Positive for abdominal pain. Negative for anorexia, diarrhea, flatus, hematochezia, melena, nausea and vomiting.  Genitourinary: Negative for dysuria, frequency and hematuria.  Musculoskeletal: Negative for arthralgias and myalgias.  Neurological: Negative for headaches.  All other systems reviewed and are negative.    Physical Exam Updated Vital Signs BP (!) 163/91   Pulse 72   Temp 97.9 F (36.6 C) (Oral)   Resp 18   Ht 5\' 9"  (1.753 m)   Wt 163 lb (73.9 kg)   SpO2 96%   BMI 24.07 kg/m   Physical Exam  Constitutional: She is oriented to person, place, and time. She appears well-developed and well-nourished. No distress.  HENT:  Head: Normocephalic and atraumatic.  Nose: Nose normal.  Mouth/Throat: No oropharyngeal exudate.  Eyes: Conjunctivae and EOM are normal. Pupils are equal, round, and reactive to light.  Neck: Normal range of motion. Neck supple.  Cardiovascular: Normal rate, regular rhythm and intact distal pulses.   Pulmonary/Chest: Effort normal and breath sounds normal. She has no wheezes. She has no rales.  Abdominal: Soft. She exhibits no mass. There is no tenderness. There is no rebound and no guarding.  Gassy throughout  Musculoskeletal: Normal range of motion.  Neurological: She is alert and oriented to person, place, and time.  Skin: Skin is warm and dry. Capillary refill takes less than 2 seconds.  Psychiatric: She has a normal mood and affect.     ED Treatments / Results   Vitals:   08/15/16 0700 08/15/16 0800  BP: (!) 163/91 132/86  Pulse: 72 76  Resp:  18  Temp:  98.3 F (36.8 C)   Radiology Dg Abdomen Acute W/chest  Result Date: 08/15/2016 CLINICAL DATA:  Mid upper epigastric abdominal pain. EXAM: DG ABDOMEN ACUTE W/ 1V CHEST COMPARISON:  CT 01/17/2016 FINDINGS: The cardiomediastinal contours are normal. The lungs are clear. There is no free intra-abdominal air. No dilated bowel  loops to suggest obstruction. Small volume of stool throughout the colon. Cholecystectomy clips in the right upper quadrant of the abdomen. No radiopaque calculi. Pelvic phleboliths again seen. No acute osseous abnormalities are seen. IMPRESSION: Negative abdominal radiographs.  No acute cardiopulmonary disease. Electronically Signed   By: Rubye OaksMelanie  Ehinger M.D.   On: 08/15/2016 06:56    Procedures Procedures (including critical care time)  Medications Ordered in ED Medications  sucralfate (CARAFATE) tablet 1 g (1 g Oral Given 08/15/16 0648)  ketorolac (TORADOL) injection 60 mg (not administered)  gi cocktail (Maalox,Lidocaine,Donnatal) (30 mLs Oral Given 08/15/16 0627)  dicyclomine (BENTYL) injection 20 mg (20 mg Intramuscular Given 08/15/16 0627)  famotidine (PEPCID) tablet 20 mg (20 mg Oral Given 08/15/16 0648)  pantoprazole (PROTONIX) EC tablet 40 mg (40 mg Oral Given 08/15/16 [id number])     Final Clinical Impressions(s) /  ED Diagnoses  Gastritis: no alcohol. Bland diet.  Take your carafate, and protonix and follow up with GI.  Exam and vitals are benign and reassuring.  I do not believe advanced imaging or labs are necessary given this is consistent with previous gastritis, her meds were expired.  The patient is nontoxic-appearing on exam and vital signs are within normal limits. Return for vomiting, fever, urinary symptoms, inability to pass stool or gas or  intractable pain or any concerns.  After history, exam, and medical workup I feel the patient has been appropriately medically screened and is safe for discharge home. Pertinent diagnoses were discussed with the patient. Patient was given return precautions.      Cy Blamer, MD 08/15/16 1610    Cy Blamer, MD 08/15/16 [phone]

## 2016-08-15 NOTE — ED Notes (Signed)
Report received, pt care assumed. Pt resting quietly in nad.  

## 2016-12-18 ENCOUNTER — Encounter (HOSPITAL_BASED_OUTPATIENT_CLINIC_OR_DEPARTMENT_OTHER): Payer: Self-pay | Admitting: Emergency Medicine

## 2016-12-18 ENCOUNTER — Emergency Department (HOSPITAL_BASED_OUTPATIENT_CLINIC_OR_DEPARTMENT_OTHER)
Admission: EM | Admit: 2016-12-18 | Discharge: 2016-12-18 | Disposition: A | Discharge status: Home / Self Care | Payer: BLUE CROSS/BLUE SHIELD | Attending: Emergency Medicine | Admitting: Emergency Medicine

## 2016-12-18 DIAGNOSIS — Z87891 Personal history of nicotine dependence: Secondary | ICD-10-CM | POA: Insufficient documentation

## 2016-12-18 DIAGNOSIS — R1032 Left lower quadrant pain: Secondary | ICD-10-CM | POA: Diagnosis present

## 2016-12-18 DIAGNOSIS — M5440 Lumbago with sciatica, unspecified side: Secondary | ICD-10-CM

## 2016-12-18 DIAGNOSIS — Z79899 Other long term (current) drug therapy: Secondary | ICD-10-CM | POA: Diagnosis not present

## 2016-12-18 DIAGNOSIS — M5442 Lumbago with sciatica, left side: Secondary | ICD-10-CM | POA: Diagnosis not present

## 2016-12-18 LAB — URINALYSIS, ROUTINE W REFLEX MICROSCOPIC
Bilirubin Urine: NEGATIVE
GLUCOSE, UA: NEGATIVE mg/dL
Hgb urine dipstick: NEGATIVE
KETONES UR: 15 mg/dL — AB
Leukocytes, UA: NEGATIVE
NITRITE: NEGATIVE
PROTEIN: NEGATIVE mg/dL
Specific Gravity, Urine: 1.031 — ABNORMAL HIGH (ref 1.005–1.030)
pH: 5.5 (ref 5.0–8.0)

## 2016-12-18 MED ORDER — PREDNISONE 20 MG PO TABS
ORAL_TABLET | ORAL | 0 refills | Status: DC
Start: 2016-12-18 — End: 2017-10-27

## 2016-12-18 MED ORDER — IBUPROFEN 400 MG PO TABS
400.0000 mg | ORAL_TABLET | Freq: Once | ORAL | Status: AC
  Administered 2016-12-18: 400 mg via ORAL
  Filled 2016-12-18: qty 1

## 2016-12-18 MED ORDER — TRAMADOL HCL 50 MG PO TABS: 50.0000 mg | ORAL_TABLET | Freq: Four times a day (QID) | ORAL | 0 refills | Status: DC | PRN

## 2016-12-18 NOTE — ED Triage Notes (Signed)
L side back pain with dysuria x 2 days.

## 2016-12-18 NOTE — Discharge Instructions (Signed)
It was our pleasure to provide your ER care today - we hope that you feel better.  Take prednisone as prescribed.  You may take ultram as need for pain - no driving when taking.  Avoid bending at waist, or heavy lifting > 10 lbs for the next few days.   Heat to sore area.  Follow up with primary care doctor in 1 week if symptoms fail to improve/resolve.  Your blood pressure is high today - follow up with your doctor in the next couple weeks.   Return to ER if worse, fevers, leg numbness/weakness, other concern.

## 2016-12-18 NOTE — ED Provider Notes (Signed)
[facility]   CSN: [id number] Arrival date & time: 12/18/16  1249     History   Chief Complaint Chief Complaint  Patient presents with  . Back Pain    HPI Barbara [name] Start is a 56 y.o. female.  Patient w hx ddd, c/o left low back pain radiating to left leg for the past few days.  Pain constant, dull, mod-severe. Worse w certain positions, movements, bending. No associated numbness or weakness. No fever or chills. No dysuria or hematuria. No anterior pain - no abd or pelvic pain. Denies specific injury/strain. States has been several yrs since last had similar back pain/problems.     Back Pain   Pertinent negatives include no chest pain, no fever, no numbness, no abdominal pain, no dysuria, no pelvic pain and no weakness.    Past Medical History:  Diagnosis Date  . GERD (gastroesophageal reflux disease)   . Low iron   . Sickle cell anemia [facility](HCC)     Patient Active Problem List   Diagnosis Date Noted  . GERD (gastroesophageal reflux disease) 08/27/2014  . Chest pain 08/26/2014    Past Surgical History:  Procedure Laterality Date  . ANTERIOR CRUCIATE LIGAMENT REPAIR    . CHOLECYSTECTOMY    . ENDOMETRIAL ABLATION    . LEFT HEART CATHETERIZATION WITH CORONARY ANGIOGRAM N/A 08/27/2014   Procedure: LEFT HEART CATHETERIZATION WITH CORONARY ANGIOGRAM;  Surgeon: Iran OuchMuhammad A Arida, MD;  Location: [facility] CATH LAB;  Service: Cardiovascular;  Laterality: N/A;    OB History    No data available       Home Medications    Prior to Admission medications   Medication Sig Start Date End Date Taking? Authorizing Provider  calcium carbonate (TUMS - DOSED IN MG ELEMENTAL CALCIUM) 500 MG chewable tablet Chew 1 tablet by mouth daily as needed for indigestion or heartburn.    [provider]  dicyclomine (BENTYL) 20 MG tablet Take 1 tablet (20 mg total) by mouth 2 (two) times daily. 08/15/16   Palumbo, April, MD  HYDROcodone-acetaminophen (NORCO/VICODIN)  5-325 MG per tablet Take 1 tablet by mouth every 6 (six) hours as needed for moderate pain. 12/11/14   Elwin MochaWalden, Blair, MD  Ibuprofen-Diphenhydramine HCl (ADVIL PM) 200-25 MG CAPS Take 1 capsule by mouth at bedtime as needed and may repeat dose one time if needed (pain, sleep).    [provider]  loratadine (CLARITIN) 10 MG tablet Take 10 mg by mouth daily.    [provider]  meloxicam (MOBIC) 7.5 MG tablet Take 1 tablet (7.5 mg total) by mouth daily as needed. Avoid/Limit use if possible Patient not taking: Reported on 12/03/2014 08/28/14   Zannie CoveJoseph, Preetha, MD  omeprazole (PRILOSEC) 20 MG capsule Take 20 mg by mouth daily.    [provider]  ondansetron (ZOFRAN ODT) 4 MG disintegrating tablet Take 1 tablet (4 mg total) by mouth every 6 (six) hours as needed for nausea or vomiting. 12/11/14   Elwin MochaWalden, Blair, MD  pantoprazole (PROTONIX) 40 MG tablet Take 1 tablet (40 mg total) by mouth daily. 08/15/16   Palumbo, April, MD  sucralfate (CARAFATE) 1 GM/10ML suspension Take 10 mLs (1 g total) by mouth 4 (four) times daily -  with meals and at bedtime. 08/15/16   Palumbo, April, MD    Family History Family History  Problem Relation Age of Onset  . CAD Brother 7845       CABG at age 55, and deceased  . CAD Sister 6050  1 stent  . CAD Father 2       Deceased in 31s    Social History Social History  Substance Use Topics  . Smoking status: Former Smoker    Years: 1.00    Types: Cigarettes    Quit date: 08/26/2013  . Smokeless tobacco: Never Used  . Alcohol use Yes     Comment: occasionally, glass of wine     Allergies   Iodine; Morphine and related; and Sulfa antibiotics   Review of Systems Review of Systems  Constitutional: Negative for fever.  Respiratory: Negative for shortness of breath.   Cardiovascular: Negative for chest pain.  Gastrointestinal: Negative for abdominal pain and vomiting.  Genitourinary: Negative for dysuria, hematuria, pelvic pain, vaginal  bleeding and vaginal discharge.  Musculoskeletal: Positive for back pain.  Skin: Negative for rash.  Neurological: Negative for weakness and numbness.     Physical Exam Updated Vital Signs BP (!) 151/90 (BP Location: Left Arm)   Pulse 79   Temp 98.7 F (37.1 C) (Oral)   Resp 20   Ht 1.753 m (5\' 9" )   Wt 75.3 kg (166 lb)   SpO2 100%   BMI 24.51 kg/m   Physical Exam  Constitutional: She appears well-developed and well-nourished. No distress.  Eyes: Conjunctivae are normal. No scleral icterus.  Neck: No tracheal deviation present.  Cardiovascular: Intact distal pulses.   Pulmonary/Chest: Effort normal. No respiratory distress.  Abdominal: Soft. Normal appearance. She exhibits no distension. There is no tenderness.  Genitourinary:  Genitourinary Comments: No cva tenderness  Musculoskeletal: She exhibits no edema.  TLS spine non tender, aligned, no step off. Left lumbar muscular and left sciatic notch pain. No pain w passive rom at left hip and knee. Distal pulses palp. No leg swelling.   Neurological: She is alert.  Straight leg raise neg. LLE motor 5/5. sens grossly intact. Steady gait.   Skin: Skin is warm and dry. No rash noted. She is not diaphoretic.  No shingles to area of pain  Psychiatric: She has a normal mood and affect.  Nursing note and vitals reviewed.    ED Treatments / Results  Labs (all labs ordered are listed, but only abnormal results are displayed) Labs Reviewed  URINALYSIS, ROUTINE W REFLEX MICROSCOPIC - Abnormal; Notable for the following:       Result Value   Color, Urine AMBER (*)    APPearance CLOUDY (*)    Specific Gravity, Urine 1.031 (*)    Ketones, ur 15 (*)    All other components within normal limits    EKG  EKG Interpretation None       Radiology No results found.  Procedures Procedures (including critical care time)  Medications Ordered in ED Medications  ibuprofen (ADVIL,MOTRIN) tablet 400 mg (not administered)      Initial Impression / Assessment and Plan / ED Course  I have reviewed the triage vital signs and the nursing notes.  Pertinent labs & imaging results that were available during my care of the patient were reviewed by me and considered in my medical decision making (see chart for details).  No meds pta.   Given pts hx ddd, nature of pain and exam, feel most likely musculoskeletal back pain, possible due to ddd/sciatica.   Motrin po.  rx pred taper for home, ultram for pain.    rec pcp f/u.   Final Clinical Impressions(s) / ED Diagnoses   Final diagnoses:  None    New Prescriptions New Prescriptions  No medications on file     Cathren LaineSteinl, Cate Oravec, MD 12/18/16 1423

## 2016-12-18 NOTE — ED Notes (Signed)
Pt discharged to home NAD.  

## 2017-05-05 ENCOUNTER — Other Ambulatory Visit: Payer: Self-pay

## 2017-05-05 ENCOUNTER — Encounter (HOSPITAL_BASED_OUTPATIENT_CLINIC_OR_DEPARTMENT_OTHER): Payer: Self-pay | Admitting: Emergency Medicine

## 2017-05-05 ENCOUNTER — Emergency Department (HOSPITAL_BASED_OUTPATIENT_CLINIC_OR_DEPARTMENT_OTHER)
Admission: EM | Admit: 2017-05-05 | Discharge: 2017-05-05 | Disposition: A | Discharge status: Home / Self Care | Payer: BLUE CROSS/BLUE SHIELD | Attending: Emergency Medicine | Admitting: Emergency Medicine

## 2017-05-05 DIAGNOSIS — M545 Low back pain, unspecified: Secondary | ICD-10-CM

## 2017-05-05 DIAGNOSIS — Z79899 Other long term (current) drug therapy: Secondary | ICD-10-CM | POA: Insufficient documentation

## 2017-05-05 DIAGNOSIS — Z87891 Personal history of nicotine dependence: Secondary | ICD-10-CM | POA: Diagnosis not present

## 2017-05-05 MED ORDER — CYCLOBENZAPRINE HCL 5 MG PO TABS: 5.0000 mg | ORAL_TABLET | Freq: Two times a day (BID) | ORAL | 0 refills | Status: DC | PRN

## 2017-05-05 MED ORDER — LIDOCAINE 5 % EX PTCH: 1.0000 | MEDICATED_PATCH | CUTANEOUS | 0 refills | Status: DC

## 2017-05-05 MED ORDER — TRAMADOL HCL 50 MG PO TABS
50.0000 mg | ORAL_TABLET | Freq: Once | ORAL | Status: AC
Start: 2017-05-05 — End: 2017-05-05
  Administered 2017-05-05: 50 mg via ORAL
  Filled 2017-05-05: qty 1

## 2017-05-05 MED ORDER — TRAMADOL HCL 50 MG PO TABS: 50.0000 mg | ORAL_TABLET | Freq: Two times a day (BID) | ORAL | 0 refills | Status: DC | PRN

## 2017-05-05 MED FILL — LIDOCAINE PATCH 5%: 5 | 30 days supply | Qty: 30 | Fill #0

## 2017-05-05 MED FILL — traMADol HCL 50 MG TABS: 50 | 4 days supply | Qty: 7 | Fill #0

## 2017-05-05 MED FILL — CYCLOBENZAPRINE 5 MG TABLET: 5 | 5 days supply | Qty: 10 | Fill #0

## 2017-05-05 NOTE — Discharge Instructions (Signed)
Continue to take Mobic once a day. Use Lidoderm patches for pain control. Take Ultram as needed for breakthrough pain. Use Flexeril as needed for muscle stiffness or soreness.  Have caution, as this may make you tired or groggy.  Do not drive or operate heavy machinery while taking this medicine or the Ultram. Use ice packs or heating pads to help control your pain. Do not stay in one position for an extended period of time, as this will cause worsening symptoms.  Make sure you stand up and walk around occasionally to loosen the muscles. Follow-up with your orthopedic doctor for further evaluation of your pain. Return to the emergency room if you develop fevers, loss of bowel or bladder control, numbness, or any new or worsening symptoms.

## 2017-05-05 NOTE — ED Provider Notes (Signed)
[facility] EMERGENCY DEPARTMENT Provider Note   CSN: [id number] Arrival date & time: 05/05/17  0906     History   Chief Complaint Chief Complaint  Patient presents with  . Back Pain    HPI Barbara [name] is a 56 y.o. female presenting with back pain.  Patient states yesterday she was doing dishes when she gradually developed bilateral back pain.  It begins in her low back and radiates around her hips to the front.  It is described as an ache.  She denies fall, trauma, or injury.  She has taken Mobic and Percocet without improvement of symptoms.  She reports history of back pain, but states this feels different than her normal sciatica.  She denies red flags including fevers, chills, loss of bowel or bladder control, numbness, tingling, history of cancer, or history of IV drug use.  Pain is constant, worse with movement.  It does not radiate down her legs.  Denies pain of the upper back.   HPI  Past Medical History:  Diagnosis Date  . GERD (gastroesophageal reflux disease)   . Low iron   . Sickle cell anemia [facility](HCC)     Patient Active Problem List   Diagnosis Date Noted  . GERD (gastroesophageal reflux disease) 08/27/2014  . Chest pain 08/26/2014    Past Surgical History:  Procedure Laterality Date  . ANTERIOR CRUCIATE LIGAMENT REPAIR    . CHOLECYSTECTOMY    . ENDOMETRIAL ABLATION    . LEFT HEART CATHETERIZATION WITH CORONARY ANGIOGRAM N/A 08/27/2014   Procedure: LEFT HEART CATHETERIZATION WITH CORONARY ANGIOGRAM;  Surgeon: Iran OuchMuhammad A Arida, MD;  Location: [facility] CATH [facility];  Service: Cardiovascular;  Laterality: N/A;    OB History    No data available       Home Medications    Prior to Admission medications   Medication Sig Start Date End Date Taking? Authorizing Provider  calcium carbonate (TUMS - DOSED IN MG ELEMENTAL CALCIUM) 500 MG chewable tablet Chew 1 tablet by mouth daily as needed for indigestion or heartburn.    [provider]    cyclobenzaprine (FLEXERIL) 5 MG tablet Take 1 tablet (5 mg total) by mouth 2 (two) times daily as needed for muscle spasms. 05/05/17   Delvis Kau, PA-C  dicyclomine (BENTYL) 20 MG tablet Take 1 tablet (20 mg total) by mouth 2 (two) times daily. 08/15/16   Palumbo, April, MD  HYDROcodone-acetaminophen (NORCO/VICODIN) 5-325 MG per tablet Take 1 tablet by mouth every 6 (six) hours as needed for moderate pain. 12/11/14   Elwin MochaWalden, Blair, MD  Ibuprofen-Diphenhydramine HCl (ADVIL PM) 200-25 MG CAPS Take 1 capsule by mouth at bedtime as needed and may repeat dose one time if needed (pain, sleep).    [provider]  lidocaine (LIDODERM) 5 % Place 1 patch onto the skin daily. Remove & Discard patch within 12 hours or as directed by MD 05/05/17   Kipling Graser, PA-C  loratadine (CLARITIN) 10 MG tablet Take 10 mg by mouth daily.    [provider]  meloxicam (MOBIC) 7.5 MG tablet Take 1 tablet (7.5 mg total) by mouth daily as needed. Avoid/Limit use if possible Patient not taking: Reported on 12/03/2014 08/28/14   Zannie CoveJoseph, Preetha, MD  omeprazole (PRILOSEC) 20 MG capsule Take 20 mg by mouth daily.    [provider]  ondansetron (ZOFRAN ODT) 4 MG disintegrating tablet Take 1 tablet (4 mg total) by mouth every 6 (six) hours as needed for nausea or vomiting. 12/11/14  Elwin Mocha, MD  pantoprazole (PROTONIX) 40 MG tablet Take 1 tablet (40 mg total) by mouth daily. 08/15/16   Palumbo, April, MD  predniSONE (DELTASONE) 20 MG tablet 3 po once a day for 2 days, then 2 po once a day for 2 days, then 1 po once a day for 2 days 12/18/16   Cathren Laine, MD  sucralfate (CARAFATE) 1 GM/10ML suspension Take 10 mLs (1 g total) by mouth 4 (four) times daily -  with meals and at bedtime. 08/15/16   Palumbo, April, MD  traMADol (ULTRAM) 50 MG tablet Take 1 tablet (50 mg total) by mouth every 12 (twelve) hours as needed for severe pain. 05/05/17   Shante Archambeault, PA-C    Family History Family  History  Problem Relation Age of Onset  . CAD Brother 42       CABG at age 56, and deceased  . CAD Sister 50       1 stent  . CAD Father 105       Deceased in 25s    Social History Social History   Tobacco Use  . Smoking status: Former Smoker    Years: 1.00    Types: Cigarettes    Last attempt to quit: 08/26/2013    Years since quitting: 3.6  . Smokeless tobacco: Never Used  Substance Use Topics  . Alcohol use: Yes    Comment: occasionally, glass of wine  . Drug use: No     Allergies   Iodine; Morphine and related; and Sulfa antibiotics   Review of Systems Review of Systems  Musculoskeletal: Positive for back pain.  Neurological: Negative for numbness.  Hematological: Does not bruise/bleed easily.     Physical Exam Updated Vital Signs BP 125/86 (BP Location: Right Arm)   Pulse 70   Temp 97.9 F (36.6 C) (Oral)   Resp 18   Ht 5\' 9"  (1.753 m)   Wt 73 kg (161 lb)   SpO2 100%   BMI 23.78 kg/m   Physical Exam  Constitutional: She is oriented to person, place, and time. She appears well-developed and well-nourished. No distress.  HENT:  Head: Normocephalic and atraumatic.  Eyes: EOM are normal.  Neck: Normal range of motion.  Cardiovascular: Normal rate, regular rhythm and intact distal pulses.  Pulmonary/Chest: Effort normal and breath sounds normal. No respiratory distress. She has no wheezes.  Abdominal: She exhibits no distension.  Musculoskeletal: Normal range of motion.  Tenderness palpation of bilateral low back musculature and lateral hips.  No tenderness to palpation over midline spine.  Negative straight leg raise.  Patient is ambulatory with pain.  Sensation intact bilaterally.  Pedal pulses equal bilaterally.  No tenderness palpation of the upper back or neck.   Neurological: She is alert and oriented to person, place, and time.  Skin: Skin is warm. No rash noted.  Psychiatric: She has a normal mood and affect.  Nursing note and vitals  reviewed.    ED Treatments / Results  Labs (all labs ordered are listed, but only abnormal results are displayed) Labs Reviewed - No data to display  EKG  EKG Interpretation None       Radiology No results found.  Procedures Procedures (including critical care time)  Medications Ordered in ED Medications  traMADol (ULTRAM) tablet 50 mg (50 mg Oral Given 05/05/17 0959)     Initial Impression / Assessment and Plan / ED Course  I have reviewed the triage vital signs and the nursing notes.  Pertinent  labs & imaging results that were available during my care of the patient were reviewed by me and considered in my medical decision making (see chart for details).     Patient presenting with bilateral low back pain.  Physical exam reassuring, she is neurovascularly intact.  No red flags for back pain.  Pain is along the musculature and into the hips.  I do not believe x-rays are necessary at this time, as she has had no falls or injury, and symptoms are bilateral.  Doubt spinal cord compression or myelopathy including cauda equina syndrome.  Likely muscular pain.  Will treat with NSAIDs, muscle relaxer, Lidoderm, and tramadol as needed.  Patient to follow-up with orthopedic doctor for further evaluation.  At this time, patient appears safe for discharge.  Return precautions given.  Patient states she understands and agrees plan.   Final Clinical Impressions(s) / ED Diagnoses   Final diagnoses:  Acute bilateral low back pain without sciatica    ED Discharge Orders        Ordered    lidocaine (LIDODERM) 5 %  Every 24 hours     05/05/17 0945    traMADol (ULTRAM) 50 MG tablet  Every 12 hours PRN     05/05/17 0945    cyclobenzaprine (FLEXERIL) 5 MG tablet  2 times daily PRN     05/05/17 0945       Alveria ApleyCaccavale, Rayman Petrosian, PA-C 05/05/17 1727    Linwood DibblesKnapp, Jon, MD 05/06/17 [phone]

## 2017-05-05 NOTE — ED Notes (Signed)
Patient made more comfortable so as not to hurt her back standing up and down when she arrived on a "stedy"  - the patient was then taken to pharmacy and to car via "stedy"  - pateint was able to stand and get into car with 1 person minimal assistance

## 2017-05-05 NOTE — ED Triage Notes (Signed)
Patient states that she ism having pain to her lower back and down her right side. The patient reports that she is having muscles spasms

## 2017-05-05 NOTE — ED Notes (Signed)
Patient complains that it hurts when she stands up and sits down. The patient is pacing in the room with pain. The patient was offered and chose to "sit" in a "stedy" so she did not have to stand up and sit down  - patient was pleased about this

## 2017-05-06 ENCOUNTER — Encounter (HOSPITAL_BASED_OUTPATIENT_CLINIC_OR_DEPARTMENT_OTHER): Payer: Self-pay | Admitting: *Deleted

## 2017-05-06 ENCOUNTER — Other Ambulatory Visit: Payer: Self-pay

## 2017-05-06 ENCOUNTER — Emergency Department (HOSPITAL_BASED_OUTPATIENT_CLINIC_OR_DEPARTMENT_OTHER)
Admission: EM | Admit: 2017-05-06 | Discharge: 2017-05-06 | Disposition: A | Discharge status: Home / Self Care | Payer: BLUE CROSS/BLUE SHIELD | Attending: Physician Assistant | Admitting: Physician Assistant

## 2017-05-06 ENCOUNTER — Emergency Department (HOSPITAL_BASED_OUTPATIENT_CLINIC_OR_DEPARTMENT_OTHER): Payer: BLUE CROSS/BLUE SHIELD

## 2017-05-06 DIAGNOSIS — Z79899 Other long term (current) drug therapy: Secondary | ICD-10-CM | POA: Insufficient documentation

## 2017-05-06 DIAGNOSIS — R109 Unspecified abdominal pain: Secondary | ICD-10-CM | POA: Diagnosis not present

## 2017-05-06 DIAGNOSIS — Z87891 Personal history of nicotine dependence: Secondary | ICD-10-CM | POA: Diagnosis not present

## 2017-05-06 DIAGNOSIS — R1031 Right lower quadrant pain: Secondary | ICD-10-CM | POA: Diagnosis not present

## 2017-05-06 LAB — COMPREHENSIVE METABOLIC PANEL
ALBUMIN: 4 g/dL (ref 3.5–5.0)
ALK PHOS: 96 U/L (ref 38–126)
ALT: 16 U/L (ref 14–54)
ANION GAP: 9 (ref 5–15)
AST: 19 U/L (ref 15–41)
BUN: 13 mg/dL (ref 6–20)
CO2: 24 mmol/L (ref 22–32)
Calcium: 9.2 mg/dL (ref 8.9–10.3)
Chloride: 106 mmol/L (ref 101–111)
Creatinine, Ser: 0.78 mg/dL (ref 0.44–1.00)
GFR calc Af Amer: >60 mL/min (ref >60)
GFR calc non Af Amer: >60 mL/min (ref >60)
GLUCOSE: 97 mg/dL (ref 65–99)
POTASSIUM: 3.8 mmol/L (ref 3.5–5.1)
SODIUM: 139 mmol/L (ref 135–145)
Total Bilirubin: 0.4 mg/dL (ref 0.3–1.2)
Total Protein: 7.5 g/dL (ref 6.5–8.1)

## 2017-05-06 LAB — URINALYSIS, ROUTINE W REFLEX MICROSCOPIC
Glucose, UA: NEGATIVE mg/dL
KETONES UR: NEGATIVE mg/dL
LEUKOCYTES UA: NEGATIVE
NITRITE: NEGATIVE
PROTEIN: NEGATIVE mg/dL
Specific Gravity, Urine: >1.03 — ABNORMAL HIGH (ref 1.005–1.030)
pH: 6 (ref 5.0–8.0)

## 2017-05-06 LAB — URINALYSIS, MICROSCOPIC (REFLEX)

## 2017-05-06 LAB — CBC WITH DIFFERENTIAL/PLATELET
BASOS ABS: 0 10*3/uL (ref 0.0–0.1)
BASOS PCT: 0 %
EOS ABS: 0.1 10*3/uL (ref 0.0–0.7)
Eosinophils Relative: 1 %
HCT: 30.7 % — ABNORMAL LOW (ref 36.0–46.0)
HEMOGLOBIN: 10.6 g/dL — AB (ref 12.0–15.0)
Lymphocytes Relative: 25 %
Lymphs Abs: 2.1 10*3/uL (ref 0.7–4.0)
MCH: 27.6 pg (ref 26.0–34.0)
MCHC: 34.5 g/dL (ref 30.0–36.0)
MCV: 79.9 fL (ref 78.0–100.0)
MONOS PCT: 5 %
Monocytes Absolute: 0.4 10*3/uL (ref 0.1–1.0)
NEUTROS PCT: 69 %
Neutro Abs: 5.9 10*3/uL (ref 1.7–7.7)
Platelets: 268 10*3/uL (ref 150–400)
RBC: 3.84 MIL/uL — ABNORMAL LOW (ref 3.87–5.11)
RDW: 13.5 % (ref 11.5–15.5)
WBC: 8.6 10*3/uL (ref 4.0–10.5)

## 2017-05-06 MED ORDER — MORPHINE SULFATE (PF) 4 MG/ML IV SOLN
4.0000 mg | Freq: Once | INTRAVENOUS | Status: AC
  Administered 2017-05-06: 4 mg via INTRAVENOUS
  Filled 2017-05-06: qty 1

## 2017-05-06 MED ORDER — KETOROLAC TROMETHAMINE 30 MG/ML IJ SOLN
15.0000 mg | Freq: Once | INTRAMUSCULAR | Status: AC
  Administered 2017-05-06: 15 mg via INTRAVENOUS
  Filled 2017-05-06: qty 1

## 2017-05-06 MED ORDER — FLUCONAZOLE 50 MG PO TABS
150.0000 mg | ORAL_TABLET | Freq: Once | ORAL | Status: AC
  Administered 2017-05-06: 150 mg via ORAL
  Filled 2017-05-06: qty 1

## 2017-05-06 MED ORDER — DEXTROSE 5 % IV SOLN
1.0000 g | Freq: Once | INTRAVENOUS | Status: AC
  Administered 2017-05-06: 1 g via INTRAVENOUS
  Filled 2017-05-06: qty 10

## 2017-05-06 MED ORDER — ONDANSETRON HCL 4 MG/2ML IJ SOLN
4.0000 mg | Freq: Once | INTRAMUSCULAR | Status: AC
  Administered 2017-05-06: 4 mg via INTRAVENOUS
  Filled 2017-05-06: qty 2

## 2017-05-06 MED ORDER — CEPHALEXIN 500 MG PO CAPS: 500.0000 mg | ORAL_CAPSULE | Freq: Three times a day (TID) | ORAL | 0 refills | Status: DC

## 2017-05-06 MED ORDER — OXYCODONE-ACETAMINOPHEN 5-325 MG PO TABS
1.0000 | ORAL_TABLET | Freq: Once | ORAL | Status: AC
  Administered 2017-05-06: 1 via ORAL
  Filled 2017-05-06: qty 1

## 2017-05-06 MED ORDER — SODIUM CHLORIDE 0.9 % IV BOLUS (SEPSIS)
1000.0000 mL | Freq: Once | INTRAVENOUS | Status: AC
  Administered 2017-05-06: 1000 mL via INTRAVENOUS

## 2017-05-06 MED ORDER — DIPHENHYDRAMINE HCL 50 MG/ML IJ SOLN
12.5000 mg | Freq: Once | INTRAMUSCULAR | Status: AC
  Administered 2017-05-06: 12.5 mg via INTRAVENOUS
  Filled 2017-05-06: qty 1

## 2017-05-06 NOTE — ED Triage Notes (Signed)
Bilateral flank pain radiating into her groin x 2 days. Denies urinary symptoms.

## 2017-05-06 NOTE — Discharge Instructions (Signed)
Your urine showed many bacteria and yeast.  Have treated you for yeast infection in the ED.  Have given you antibiotics to take for her kidney infection.  This may be the cause of your flank pain.  You also do have signs of degenerative disc disease on your CT scan of your lower back.  You may need to follow-up with a primary care doctor or orthopedic doctor if your symptoms not improving.  Would like for you to have recheck in 2-3 days with primary care doctor after antibiotic.  Return to the ED if you develop any worsening fevers, pain, vomiting or for any other reason.  May use your tramadol, muscle relaxers, Motrin and Tylenol at home for pain.

## 2017-05-06 NOTE — ED Provider Notes (Signed)
[facility] EMERGENCY DEPARTMENT Provider Note   CSN: [id number] Arrival date & time: 05/06/17  1849     History   Chief Complaint Chief Complaint  Patient presents with  . Flank Pain    HPI Barbara [name] is a 56 y.o. female.  HPI 56 year old female past medical history significant for sickle cell trait, GERD presents to the emergency department today for evaluation of ongoing back pain and right flank pain.  Patient states the pain started approximately 2 days ago.  Describes the pain is her right lower back that radiates to her right side into her right lower quadrant.  She describes the pain is aching in nature.  Denies any trauma.  Patient was seen yesterday for same and diagnosed with muscular skeletal pain at that time given chronic back pain per patient.  She was started on tramadol, Flexeril with only little relief in her symptoms.  Patient reports some mild urinary urgency but denies any dysuria, hematuria.  Denies any vaginal bleeding or discharge.  Reports some nausea but denies any emesis.  Reports chills but denies any fevers.  Patient denies any red flag symptoms including loss of bowel or bladder, saddle paresthesias, urinary retention, history of cancer, history of IV drug use, lower extremity paresthesias.  The pain is constant and worse with movement and palpation.  She is ambulatory but with pain.  Pt denies any fever, ha, vision changes, lightheadedness, dizziness, congestion, neck pain, cp, sob, cough, abd pain, v/dchange in bowel habits, melena, hematochezia, lower extremity paresthesias.  Past Medical History:  Diagnosis Date  . GERD (gastroesophageal reflux disease)   . Low iron   . Sickle cell anemia [facility](HCC)     Patient Active Problem List   Diagnosis Date Noted  . GERD (gastroesophageal reflux disease) 08/27/2014  . Chest pain 08/26/2014    Past Surgical History:  Procedure Laterality Date  . ANTERIOR CRUCIATE LIGAMENT REPAIR    .  CHOLECYSTECTOMY    . ENDOMETRIAL ABLATION    . LEFT HEART CATHETERIZATION WITH CORONARY ANGIOGRAM N/A 08/27/2014   Procedure: LEFT HEART CATHETERIZATION WITH CORONARY ANGIOGRAM;  Surgeon: Iran OuchMuhammad A Arida, MD;  Location: [facility] CATH LAB;  Service: Cardiovascular;  Laterality: N/A;    OB History    No data available       Home Medications    Prior to Admission medications   Medication Sig Start Date End Date Taking? Authorizing Provider  calcium carbonate (TUMS - DOSED IN MG ELEMENTAL CALCIUM) 500 MG chewable tablet Chew 1 tablet by mouth daily as needed for indigestion or heartburn.    [provider]  cyclobenzaprine (FLEXERIL) 5 MG tablet Take 1 tablet (5 mg total) by mouth 2 (two) times daily as needed for muscle spasms. 05/05/17   Caccavale, Sophia, PA-C  dicyclomine (BENTYL) 20 MG tablet Take 1 tablet (20 mg total) by mouth 2 (two) times daily. 08/15/16   Palumbo, April, MD  HYDROcodone-acetaminophen (NORCO/VICODIN) 5-325 MG per tablet Take 1 tablet by mouth every 6 (six) hours as needed for moderate pain. 12/11/14   Elwin MochaWalden, Blair, MD  Ibuprofen-Diphenhydramine HCl (ADVIL PM) 200-25 MG CAPS Take 1 capsule by mouth at bedtime as needed and may repeat dose one time if needed (pain, sleep).    [provider]  lidocaine (LIDODERM) 5 % Place 1 patch onto the skin daily. Remove & Discard patch within 12 hours or as directed by MD 05/05/17   Caccavale, Sophia, PA-C  loratadine (CLARITIN) 10 MG tablet Take 10 mg  by mouth daily.    [provider]  meloxicam (MOBIC) 7.5 MG tablet Take 1 tablet (7.5 mg total) by mouth daily as needed. Avoid/Limit use if possible Patient not taking: Reported on 12/03/2014 08/28/14   Zannie Cove, MD  omeprazole (PRILOSEC) 20 MG capsule Take 20 mg by mouth daily.    [provider]  ondansetron (ZOFRAN ODT) 4 MG disintegrating tablet Take 1 tablet (4 mg total) by mouth every 6 (six) hours as needed for nausea or vomiting. 12/11/14    Elwin Mocha, MD  pantoprazole (PROTONIX) 40 MG tablet Take 1 tablet (40 mg total) by mouth daily. 08/15/16   Palumbo, April, MD  predniSONE (DELTASONE) 20 MG tablet 3 po once a day for 2 days, then 2 po once a day for 2 days, then 1 po once a day for 2 days 12/18/16   Cathren Laine, MD  sucralfate (CARAFATE) 1 GM/10ML suspension Take 10 mLs (1 g total) by mouth 4 (four) times daily -  with meals and at bedtime. 08/15/16   Palumbo, April, MD  traMADol (ULTRAM) 50 MG tablet Take 1 tablet (50 mg total) by mouth every 12 (twelve) hours as needed for severe pain. 05/05/17   Caccavale, Sophia, PA-C    Family History Family History  Problem Relation Age of Onset  . CAD Brother 70       CABG at age 69, and deceased  . CAD Sister 50       1 stent  . CAD Father 102       Deceased in 36s    Social History Social History   Tobacco Use  . Smoking status: Former Smoker    Years: 1.00    Types: Cigarettes    Last attempt to quit: 08/26/2013    Years since quitting: 3.6  . Smokeless tobacco: Never Used  Substance Use Topics  . Alcohol use: Yes    Comment: occasionally, glass of wine  . Drug use: No     Allergies   Iodine; Morphine and related; and Sulfa antibiotics   Review of Systems Review of Systems  Constitutional: Negative for chills and fever.  HENT: Negative for congestion.   Eyes: Negative for visual disturbance.  Respiratory: Negative for cough and shortness of breath.   Cardiovascular: Negative for chest pain.  Gastrointestinal: Positive for nausea. Negative for abdominal pain, diarrhea and vomiting.  Genitourinary: Positive for urgency. Negative for dysuria, flank pain, frequency, hematuria, vaginal bleeding and vaginal discharge.  Musculoskeletal: Positive for back pain. Negative for arthralgias, gait problem and myalgias.  Skin: Negative for rash.  Neurological: Negative for dizziness, syncope, weakness, light-headedness, numbness and headaches.  Psychiatric/Behavioral:  Negative for sleep disturbance. The patient is not nervous/anxious.      Physical Exam Updated Vital Signs BP 125/79 (BP Location: Right Arm)   Pulse 75   Temp 98.3 F (36.8 C) (Oral)   Resp 16   Ht 5\' 9"  (1.753 m)   Wt 72.6 kg (160 lb)   SpO2 98%   BMI 23.63 kg/m   Physical Exam  Constitutional: She is oriented to person, place, and time. She appears well-developed and well-nourished.  Non-toxic appearance. No distress.  HENT:  Head: Normocephalic and atraumatic.  Nose: Nose normal.  Mouth/Throat: Oropharynx is clear and moist.  Eyes: Conjunctivae are normal. Pupils are equal, round, and reactive to light. Right eye exhibits no discharge. Left eye exhibits no discharge. No scleral icterus.  Neck: Normal range of motion. Neck supple.  Cardiovascular: Normal  rate, regular rhythm, normal heart sounds and intact distal pulses. Exam reveals no gallop and no friction rub.  No murmur heard. Pulmonary/Chest: Effort normal and breath sounds normal. No stridor. No respiratory distress. She has no wheezes. She has no rales. She exhibits no tenderness.  Abdominal: Soft. Bowel sounds are normal. There is tenderness in the right lower quadrant and suprapubic area. There is CVA tenderness (right). There is no rigidity, no rebound, no guarding, no tenderness at McBurney's point and negative Murphy's sign.  Musculoskeletal: Normal range of motion. She exhibits no tenderness.  No midline T spine or L spine tenderness. No deformities or step offs noted. Full ROM. Pelvis is stable.  Patient does have some bilateral paraspinal tenderness.   Lymphadenopathy:    She has no cervical adenopathy.  Neurological: She is alert and oriented to person, place, and time.  5 out of 5 in lower extremities.  Patellar reflexes are normal.  Sensation intact.    Skin: Skin is warm and dry. Capillary refill takes less than 2 seconds. No pallor.  Psychiatric: Her behavior is normal. Judgment and thought content normal.   Nursing note and vitals reviewed.    ED Treatments / Results  Labs (all labs ordered are listed, but only abnormal results are displayed) Labs Reviewed  URINALYSIS, ROUTINE W REFLEX MICROSCOPIC - Abnormal; Notable for the following components:      Result Value   Specific Gravity, Urine >1.030 (*)    Hgb urine dipstick TRACE (*)    Bilirubin Urine SMALL (*)    All other components within normal limits  URINALYSIS, MICROSCOPIC (REFLEX) - Abnormal; Notable for the following components:   Bacteria, UA MANY (*)    Squamous Epithelial / LPF 0-5 (*)    All other components within normal limits  CBC WITH DIFFERENTIAL/PLATELET - Abnormal; Notable for the following components:   RBC 3.84 (*)    Hemoglobin 10.6 (*)    HCT 30.7 (*)    All other components within normal limits  COMPREHENSIVE METABOLIC PANEL    EKG  EKG Interpretation None       Radiology Ct Renal Stone Study  Result Date: 05/06/2017 CLINICAL DATA:  Right flank pain x3 days EXAM: CT ABDOMEN AND PELVIS WITHOUT CONTRAST TECHNIQUE: Multidetector CT imaging of the abdomen and pelvis was performed following the standard protocol without IV contrast. COMPARISON:  01/19/2016 FINDINGS: LOWER CHEST: Minimal atelectasis at the lung bases. Included heart size is normal. No pericardial effusion. HEPATOBILIARY: Cholecystectomy. No biliary dilatation. Unremarkable noncontrast appearance of the liver. PANCREAS: Normal. SPLEEN: Normal. ADRENALS/URINARY TRACT: Kidneys are orthotopic. Punctate calcifications in the left kidney may be renovascular in etiology report nonobstructing tiny calculi. No hydronephrosis nor hydroureter. The unopacified ureters are normal in course and caliber. Urinary bladder is partially distended and unremarkable. Normal adrenal glands. STOMACH/BOWEL: The stomach, small and large bowel are normal in course and caliber without inflammatory changes. Normal appendix. VASCULAR/LYMPHATIC: Aortoiliac vessels are normal  in course and caliber. No lymphadenopathy by CT size criteria. REPRODUCTIVE: Normal. OTHER: No intraperitoneal free fluid or free air. Stable pelvic calcifications are noted bilaterally consistent phleboliths. MUSCULOSKELETAL: Moderate disc space narrowing L4-5, stable in appearance. No acute osseous abnormality. IMPRESSION: 1. Punctate calcifications of the left kidney may be renovascular etiology versus tiny nonobstructing calculi. No obstructive uropathy is noted. 2. Status post cholecystectomy. 3. Moderate degenerative disc space narrowing L4-5, stable in appearance. No acute osseous abnormality. Electronically Signed   By: Tollie Ethavid  Kwon M.D.   On: 05/06/2017 20:42  Procedures Procedures (including critical care time)  Medications Ordered in ED Medications  cefTRIAXone (ROCEPHIN) 1 g in dextrose 5 % 50 mL IVPB (not administered)  oxyCODONE-acetaminophen (PERCOCET/ROXICET) 5-325 MG per tablet 1 tablet (not administered)  sodium chloride 0.9 % bolus 1,000 mL (1,000 mLs Intravenous New Bag/Given 05/06/17 2034)  morphine 4 MG/ML injection 4 mg (4 mg Intravenous Given 05/06/17 2034)  ondansetron (ZOFRAN) injection 4 mg (4 mg Intravenous Given 05/06/17 2034)  diphenhydrAMINE (BENADRYL) injection 12.5 mg (12.5 mg Intravenous Given 05/06/17 2034)  fluconazole (DIFLUCAN) tablet 150 mg (150 mg Oral Given 05/06/17 2120)  ketorolac (TORADOL) 30 MG/ML injection 15 mg (15 mg Intravenous Given 05/06/17 2121)     Initial Impression / Assessment and Plan / ED Course  I have reviewed the triage vital signs and the nursing notes.  Pertinent labs & imaging results that were available during my care of the patient were reviewed by me and considered in my medical decision making (see chart for details).     Patient presents to the ED with complaints of right flank pain with some mild urinary urgency.  Patient was seen yesterday for same and diagnosed with musculoskeletal pain.  Patient does have known  chronic back pain but states this felt different.  Does report some mild nausea but denies any emesis.  Denies any vaginal symptoms or change in bowel habits.  Exam patient is overall well-appearing and nontoxic.  Vital signs are reassuring.  Patient is afebrile, no tachycardia, no hypotension noted.  On exam patient does have some tenderness to palpation of the lumbar paraspinal musculature with some mild right-sided flank pain.  She does have some diffuse generalized abdominal pain with no focal tenderness.  No signs of peritonitis.  Patient has normal neurological exam in the lower extremities.  She is neurovascularly intact.  Lab work is been reassuring.  No leukocytosis.  Normal kidney function.  Electrolytes are at baseline and reassuring.  Patient does have a mild anemia that is baseline for patient.  UA with small amount of hemoglobin and many bacteria she also has yeast and mucus present.  Given the many bacteria in her urine with flank pain concern for possible pyelonephritis versus infected stone.  Will give patient IV antibiotics and obtain CT renal study to rule out any acute intra-abdominal abnormalities.  Did give patient Diflucan in the ED for yeast in her urine.  CT scan shows no acute abnormality.  Does note a renal stone in the left kidney.  Also notes degenerative disc changes at L4-L5.  Intra-abdominal organs are without any acute abnormalities.  Given her reassuring blood work and imaging felt that patient can be treated for pyelonephritis in the outpatient setting.  She is able to tolerate p.o. fluids or any difficulties.  Pain is been managed in the ED.  Encourage close follow-up within 2-3 days.  I have also given her follow-up to primary care for further eval of chronic back pain and symptoms not improving.  Patient has tramadol, Flexeril at home from ED visit yesterday.  Encouraged her to take this.  Pt is hemodynamically stable, in NAD, & able to ambulate in the ED.  Evaluation does not show pathology that would require ongoing emergent intervention or inpatient treatment. I explained the diagnosis to the patient. Pain has been managed & has no complaints prior to dc. Pt is comfortable with above plan and is stable for discharge at this time. All questions were answered prior to disposition. Strict return precautions for f/u  to the ED were discussed. Encouraged follow up with PCP.  Discussed with my attending who was agreeable with the above plan.   Final Clinical Impressions(s) / ED Diagnoses   Final diagnoses:  Right flank pain    ED Discharge Orders    None       Wallace Keller 05/06/17 2255    Abelino Derrick, MD 05/08/17 0003

## 2017-05-12 ENCOUNTER — Encounter (HOSPITAL_COMMUNITY): Payer: Self-pay | Admitting: Emergency Medicine

## 2017-05-12 ENCOUNTER — Emergency Department (HOSPITAL_COMMUNITY)
Admission: EM | Admit: 2017-05-12 | Discharge: 2017-05-12 | Disposition: A | Discharge status: Home / Self Care | Payer: BLUE CROSS/BLUE SHIELD | Attending: Emergency Medicine | Admitting: Emergency Medicine

## 2017-05-12 ENCOUNTER — Other Ambulatory Visit: Payer: Self-pay

## 2017-05-12 DIAGNOSIS — M545 Low back pain, unspecified: Secondary | ICD-10-CM

## 2017-05-12 DIAGNOSIS — Z79899 Other long term (current) drug therapy: Secondary | ICD-10-CM | POA: Diagnosis not present

## 2017-05-12 DIAGNOSIS — R109 Unspecified abdominal pain: Secondary | ICD-10-CM | POA: Diagnosis not present

## 2017-05-12 DIAGNOSIS — Z87891 Personal history of nicotine dependence: Secondary | ICD-10-CM | POA: Diagnosis not present

## 2017-05-12 LAB — URINALYSIS, ROUTINE W REFLEX MICROSCOPIC
BILIRUBIN URINE: NEGATIVE
Bacteria, UA: NONE SEEN
GLUCOSE, UA: NEGATIVE mg/dL
Hgb urine dipstick: NEGATIVE
KETONES UR: 20 mg/dL — AB
NITRITE: NEGATIVE
PH: 5 (ref 5.0–8.0)
Protein, ur: NEGATIVE mg/dL
Specific Gravity, Urine: 1.024 (ref 1.005–1.030)

## 2017-05-12 LAB — CBC
HEMATOCRIT: 29.8 % — AB (ref 36.0–46.0)
HEMOGLOBIN: 10.5 g/dL — AB (ref 12.0–15.0)
MCH: 27.9 pg (ref 26.0–34.0)
MCHC: 35.2 g/dL (ref 30.0–36.0)
MCV: 79 fL (ref 78.0–100.0)
Platelets: 275 10*3/uL (ref 150–400)
RBC: 3.77 MIL/uL — ABNORMAL LOW (ref 3.87–5.11)
RDW: 13.9 % (ref 11.5–15.5)
WBC: 5.4 10*3/uL (ref 4.0–10.5)

## 2017-05-12 LAB — BASIC METABOLIC PANEL
ANION GAP: 8 (ref 5–15)
BUN: 15 mg/dL (ref 6–20)
CALCIUM: 9.2 mg/dL (ref 8.9–10.3)
CO2: 25 mmol/L (ref 22–32)
Chloride: 107 mmol/L (ref 101–111)
Creatinine, Ser: 0.9 mg/dL (ref 0.44–1.00)
GFR calc Af Amer: >60 mL/min (ref >60)
GFR calc non Af Amer: >60 mL/min (ref >60)
GLUCOSE: 96 mg/dL (ref 65–99)
Potassium: 4.3 mmol/L (ref 3.5–5.1)
Sodium: 140 mmol/L (ref 135–145)

## 2017-05-12 MED ORDER — HYDROCODONE-ACETAMINOPHEN 5-325 MG PO TABS: 1.0000 | ORAL_TABLET | Freq: Four times a day (QID) | ORAL | 0 refills | Status: DC | PRN

## 2017-05-12 MED ORDER — NAPROXEN 500 MG PO TABS: 500.0000 mg | ORAL_TABLET | Freq: Two times a day (BID) | ORAL | 0 refills | Status: DC

## 2017-05-12 NOTE — ED Provider Notes (Signed)
[facility]-EMERGENCY DEPT Provider Note   CSN: [id number] Arrival date & time: 05/12/17  1440     History   Chief Complaint Chief Complaint  Patient presents with  . Flank Pain    HPI Barbara [name] is a 56 y.o. female.  Patient last seen on December 15 for bilateral flank pain.  CT scan at that time which was negative.  Patient did get treated for urinary tract infection.  Patient has a history of chronic back pain as well.  Patient still taking her antibiotics for the urinary tract infection.  Pain is in the right CVA area back area.  No fevers no nausea or vomiting.  No diarrhea.  No hematuria no dysuria.  Patient states that the right lower back pain radiates into the anterior part of her thigh.  She has no numbness or weakness in her foot.      Past Medical History:  Diagnosis Date  . GERD (gastroesophageal reflux disease)   . Low iron   . Sickle cell anemia [facility])     Patient Active Problem List   Diagnosis Date Noted  . GERD (gastroesophageal reflux disease) 08/27/2014  . Chest pain 08/26/2014    Past Surgical History:  Procedure Laterality Date  . ANTERIOR CRUCIATE LIGAMENT REPAIR    . CHOLECYSTECTOMY    . ENDOMETRIAL ABLATION    . LEFT HEART CATHETERIZATION WITH CORONARY ANGIOGRAM N/A 08/27/2014   Procedure: LEFT HEART CATHETERIZATION WITH CORONARY ANGIOGRAM;  Surgeon: Iran Ouch, MD;  Location: [facility] CATH LAB;  Service: Cardiovascular;  Laterality: N/A;    OB History    No data available       Home Medications    Prior to Admission medications   Medication Sig Start Date End Date Taking? Authorizing Provider  cephALEXin (KEFLEX) 500 MG capsule Take 1 capsule (500 mg total) by mouth 3 (three) times daily. 05/06/17  Yes Leaphart, Lynann Beaver, PA-C  cyclobenzaprine (FLEXERIL) 5 MG tablet Take 1 tablet (5 mg total) by mouth 2 (two) times daily as needed for muscle spasms. 05/05/17  Yes Caccavale, Sophia, PA-C  lidocaine (LIDODERM) 5  % Place 1 patch onto the skin daily. Remove & Discard patch within 12 hours or as directed by MD 05/05/17  Yes Caccavale, Sophia, PA-C  meloxicam (MOBIC) 7.5 MG tablet Take 1 tablet (7.5 mg total) by mouth daily as needed. Avoid/Limit use if possible Patient taking differently: Take 15 mg by mouth daily as needed for pain. Avoid/Limit use if possible 08/28/14  Yes Zannie Cove, MD  traMADol (ULTRAM) 50 MG tablet Take 1 tablet (50 mg total) by mouth every 12 (twelve) hours as needed for severe pain. 05/05/17  Yes Caccavale, Sophia, PA-C  calcium carbonate (TUMS - DOSED IN MG ELEMENTAL CALCIUM) 500 MG chewable tablet Chew 1 tablet by mouth daily as needed for indigestion or heartburn.    [provider]  dicyclomine (BENTYL) 20 MG tablet Take 1 tablet (20 mg total) by mouth 2 (two) times daily. Patient not taking: Reported on 05/12/2017 08/15/16   Nicanor Alcon, April, MD  HYDROcodone-acetaminophen (NORCO/VICODIN) 5-325 MG per tablet Take 1 tablet by mouth every 6 (six) hours as needed for moderate pain. Patient not taking: Reported on 05/12/2017 12/11/14   Elwin Mocha, MD  HYDROcodone-acetaminophen (NORCO/VICODIN) 5-325 MG tablet Take 1-2 tablets by mouth every 6 (six) hours as needed for moderate pain. 05/12/17   Vanetta Mulders, MD  Ibuprofen-Diphenhydramine HCl (ADVIL PM) 200-25 MG CAPS Take 1 capsule by mouth at  bedtime as needed and may repeat dose one time if needed (pain, sleep).    [provider]  loratadine (CLARITIN) 10 MG tablet Take 10 mg by mouth daily.    [provider]  naproxen (NAPROSYN) 500 MG tablet Take 1 tablet (500 mg total) by mouth 2 (two) times daily. 05/12/17   Vanetta MuldersZackowski, Brendan Gruwell, MD  omeprazole (PRILOSEC) 20 MG capsule Take 20 mg by mouth daily.    [provider]  ondansetron (ZOFRAN ODT) 4 MG disintegrating tablet Take 1 tablet (4 mg total) by mouth every 6 (six) hours as needed for nausea or vomiting. Patient not taking: Reported on  05/12/2017 12/11/14   Elwin MochaWalden, Blair, MD  pantoprazole (PROTONIX) 40 MG tablet Take 1 tablet (40 mg total) by mouth daily. Patient not taking: Reported on 05/12/2017 08/15/16   Palumbo, April, MD  predniSONE (DELTASONE) 20 MG tablet 3 po once a day for 2 days, then 2 po once a day for 2 days, then 1 po once a day for 2 days Patient not taking: Reported on 05/12/2017 12/18/16   Cathren LaineSteinl, Kevin, MD  sucralfate (CARAFATE) 1 GM/10ML suspension Take 10 mLs (1 g total) by mouth 4 (four) times daily -  with meals and at bedtime. Patient not taking: Reported on 05/12/2017 08/15/16   Cy BlamerPalumbo, April, MD    Family History Family History  Problem Relation Age of Onset  . CAD Brother 5145       CABG at age 55, and deceased  . CAD Sister 50       1 stent  . CAD Father 5550       Deceased in 3750s    Social History Social History   Tobacco Use  . Smoking status: Former Smoker    Years: 1.00    Types: Cigarettes    Last attempt to quit: 08/26/2013    Years since quitting: 3.7  . Smokeless tobacco: Never Used  Substance Use Topics  . Alcohol use: Yes    Comment: occasionally, glass of wine  . Drug use: No     Allergies   Iodine; Morphine and related; Other; and Sulfa antibiotics   Review of Systems Review of Systems  Constitutional: Negative for fever.  HENT: Negative for congestion.   Eyes: Negative for redness.  Respiratory: Negative for shortness of breath.   Cardiovascular: Negative for chest pain.  Gastrointestinal: Negative for abdominal pain, nausea and vomiting.  Genitourinary: Positive for flank pain. Negative for dysuria and hematuria.  Musculoskeletal: Positive for back pain.  Skin: Negative for rash.  Neurological: Negative for headaches.  Hematological: Does not bruise/bleed easily.  Psychiatric/Behavioral: Negative for confusion.     Physical Exam Updated Vital Signs BP 134/85 (BP Location: Right Arm)   Pulse 71   Temp 98.2 F (36.8 C) (Oral)   Resp 18   SpO2 100%    Physical Exam  Constitutional: She is oriented to person, place, and time. She appears well-developed and well-nourished. No distress.  HENT:  Head: Normocephalic.  Mouth/Throat: Oropharynx is clear and moist.  Eyes: Conjunctivae and EOM are normal. Pupils are equal, round, and reactive to light.  Neck: Neck supple.  Cardiovascular: Normal rate and regular rhythm.  Pulmonary/Chest: Effort normal and breath sounds normal.  Abdominal: Soft. Bowel sounds are normal. There is no tenderness.  Musculoskeletal: Normal range of motion. She exhibits tenderness.  Tenderness to palpation right lower lumbar area.  Neurological: She is alert and oriented to person, place, and time. No cranial nerve deficit.  She exhibits normal muscle tone. Coordination normal.  Skin: Skin is warm.  Nursing note and vitals reviewed.    ED Treatments / Results  Labs (all labs ordered are listed, but only abnormal results are displayed) Labs Reviewed  URINALYSIS, ROUTINE W REFLEX MICROSCOPIC - Abnormal; Notable for the following components:      Result Value   APPearance HAZY (*)    Ketones, ur 20 (*)    Leukocytes, UA SMALL (*)    Squamous Epithelial / LPF 6-30 (*)    All other components within normal limits  CBC - Abnormal; Notable for the following components:   RBC 3.77 (*)    Hemoglobin 10.5 (*)    HCT 29.8 (*)    All other components within normal limits  BASIC METABOLIC PANEL    EKG  EKG Interpretation None       Radiology No results found.  Procedures Procedures (including critical care time)  Medications Ordered in ED Medications - No data to display   Initial Impression / Assessment and Plan / ED Course  I have reviewed the triage vital signs and the nursing notes.  Pertinent labs & imaging results that were available during my care of the patient were reviewed by me and considered in my medical decision making (see chart for details).    Symptoms seem to be most consistent  with musculoskeletal back pain.  No component of sciatica today.  Urinalysis was negative.  Urinary tract infection has resolved.  Abdomen anteriorly is soft and nontender.  Symptoms made worse with movement.  Will treat as musculoskeletal with Naprosyn and a short course of pain medicine.  Patient already has Flexeril to take at home.   Final Clinical Impressions(s) / ED Diagnoses   Final diagnoses:  Acute right-sided low back pain without sciatica    ED Discharge Orders        Ordered    naproxen (NAPROSYN) 500 MG tablet  2 times daily     05/12/17 2112    HYDROcodone-acetaminophen (NORCO/VICODIN) 5-325 MG tablet  Every 6 hours PRN     05/12/17 2112       Vanetta MuldersZackowski, Kimbella Heisler, MD 05/12/17 2335

## 2017-05-12 NOTE — ED Triage Notes (Signed)
Pt verbalizes recently seen and diagnosed with UTI, kidney stone, and bulging disc. Still taking antibiotics for same. Here for worsening/no improvement in pain.

## 2017-05-12 NOTE — Discharge Instructions (Signed)
Continue to take the Flexeril.  Take the Naprosyn on a regular basis.  Supplement with the hydrocodone as needed for additional pain control.  Call wellness clinic to see if they can follow you up as an outpatient.  Return for any new or worse symptoms.

## 2017-05-29 DIAGNOSIS — S46919A Strain of unspecified muscle, fascia and tendon at shoulder and upper arm level, unspecified arm, initial encounter: Secondary | ICD-10-CM | POA: Insufficient documentation

## 2017-07-21 DIAGNOSIS — S46819A Strain of other muscles, fascia and tendons at shoulder and upper arm level, unspecified arm, initial encounter: Secondary | ICD-10-CM | POA: Insufficient documentation

## 2017-09-13 IMAGING — CR DG ABDOMEN ACUTE W/ 1V CHEST
3 series · 3 of 3 positions shown · non-contrast
Comparison: CT 01/17/2016

CLINICAL DATA: Mid upper epigastric abdominal pain.

EXAM:
DG ABDOMEN ACUTE W/ 1V CHEST

[w chest pa]
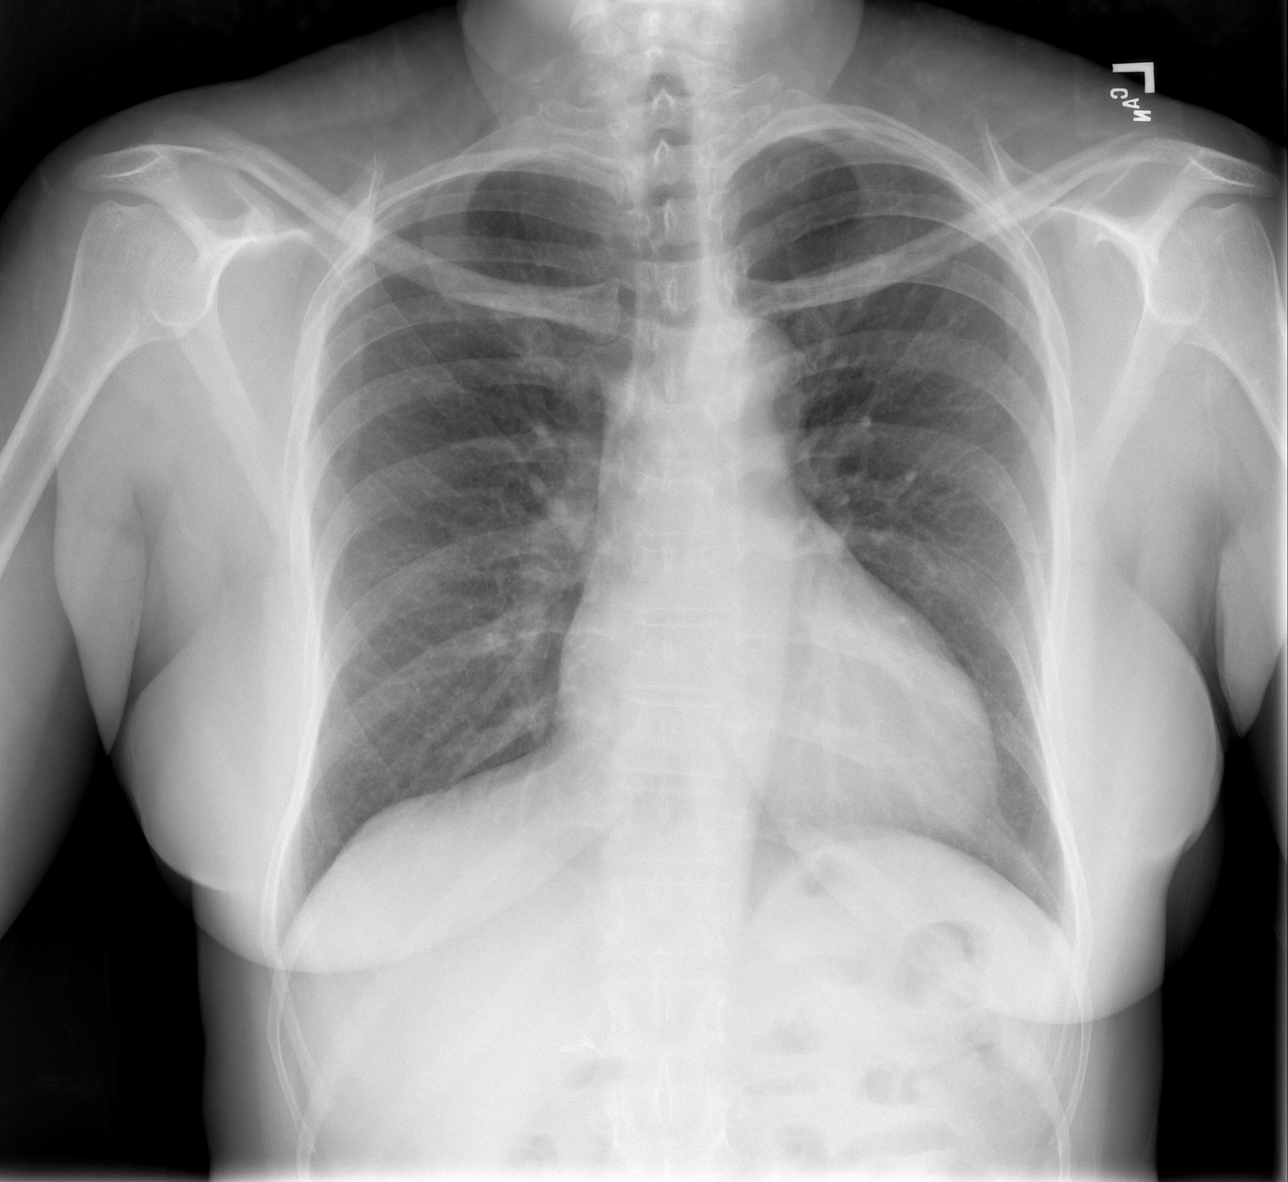

[w abdomen upright]
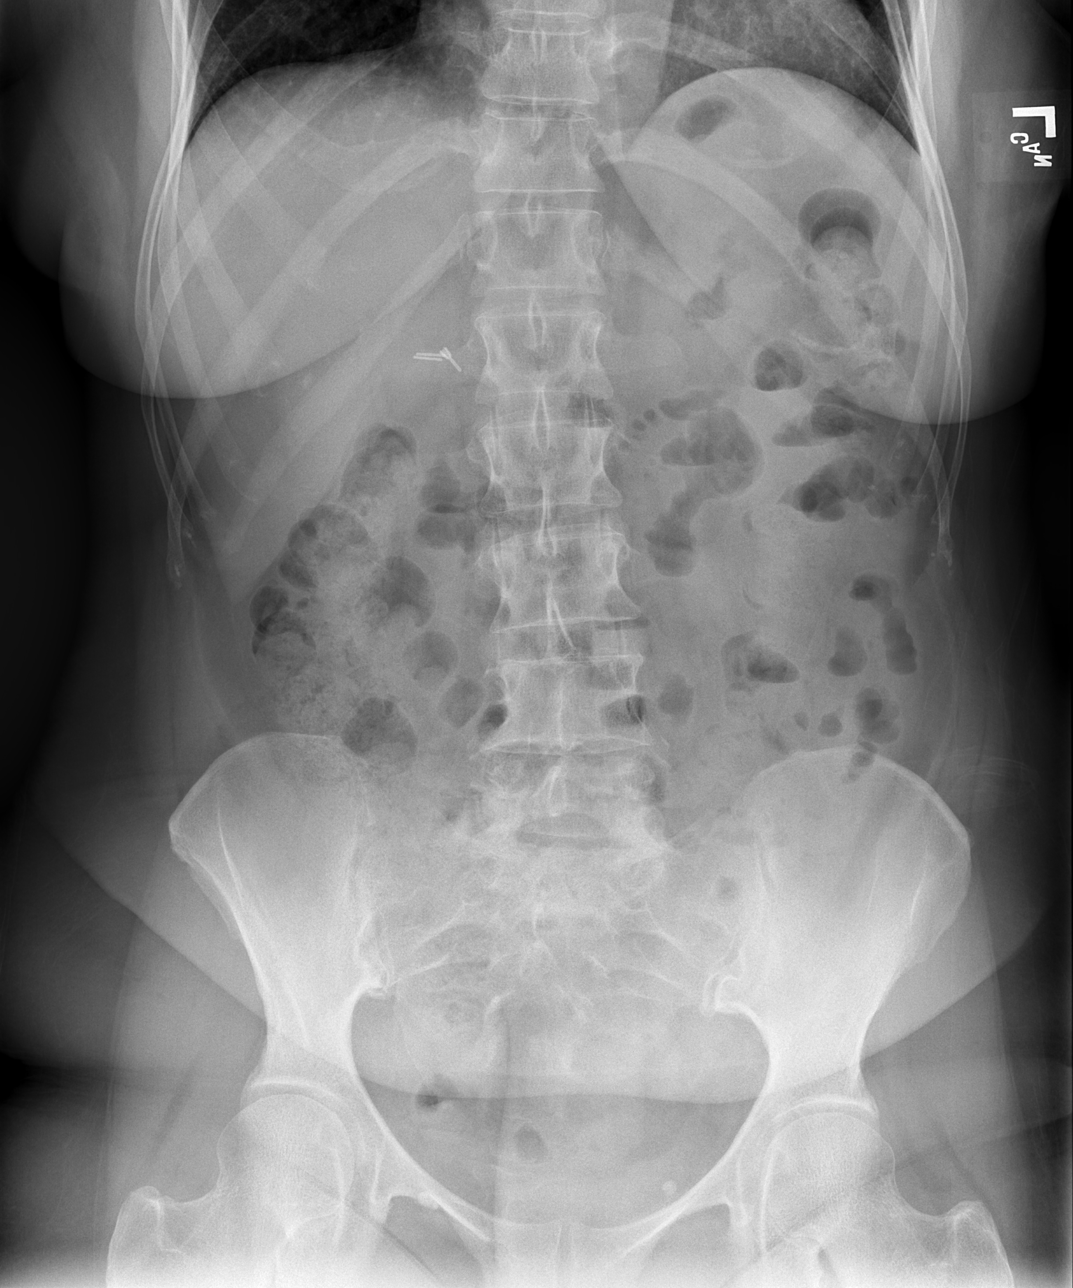

[t abdomen supine]
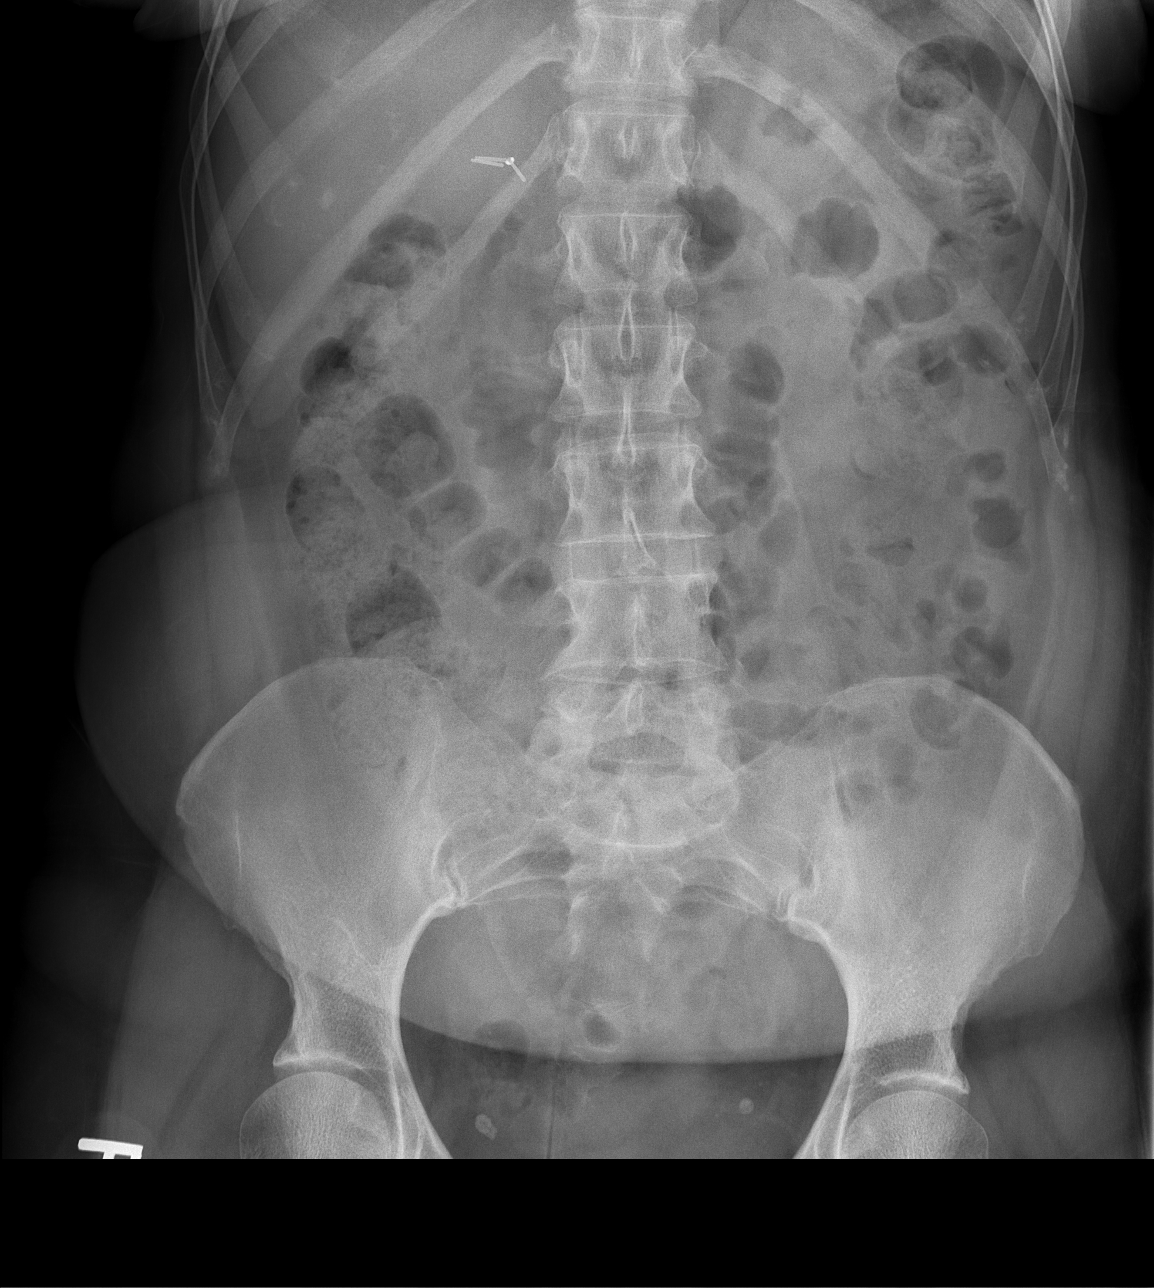

[3 of 3 positions shown; findings below may reference images not displayed]

FINDINGS: The cardiomediastinal contours are normal. The lungs are clear.
There is no free intra-abdominal air. No dilated bowel loops to
suggest obstruction. Small volume of stool throughout the colon.
Cholecystectomy clips in the right upper quadrant of the abdomen. No
radiopaque calculi. Pelvic phleboliths again seen. No acute osseous
abnormalities are seen.
IMPRESSION: Negative abdominal radiographs.  No acute cardiopulmonary disease.

## 2017-10-22 ENCOUNTER — Emergency Department (HOSPITAL_BASED_OUTPATIENT_CLINIC_OR_DEPARTMENT_OTHER)
Admission: EM | Admit: 2017-10-22 | Discharge: 2017-10-23 | Disposition: A | Discharge status: Home / Self Care | Payer: BLUE CROSS/BLUE SHIELD | Attending: Emergency Medicine | Admitting: Emergency Medicine

## 2017-10-22 ENCOUNTER — Encounter (HOSPITAL_BASED_OUTPATIENT_CLINIC_OR_DEPARTMENT_OTHER): Payer: Self-pay | Admitting: Emergency Medicine

## 2017-10-22 ENCOUNTER — Emergency Department (HOSPITAL_BASED_OUTPATIENT_CLINIC_OR_DEPARTMENT_OTHER): Payer: BLUE CROSS/BLUE SHIELD

## 2017-10-22 ENCOUNTER — Other Ambulatory Visit: Payer: Self-pay

## 2017-10-22 DIAGNOSIS — Z87891 Personal history of nicotine dependence: Secondary | ICD-10-CM | POA: Insufficient documentation

## 2017-10-22 DIAGNOSIS — Z79899 Other long term (current) drug therapy: Secondary | ICD-10-CM | POA: Diagnosis not present

## 2017-10-22 DIAGNOSIS — R079 Chest pain, unspecified: Secondary | ICD-10-CM | POA: Diagnosis not present

## 2017-10-22 DIAGNOSIS — G44209 Tension-type headache, unspecified, not intractable: Secondary | ICD-10-CM | POA: Insufficient documentation

## 2017-10-22 DIAGNOSIS — R9431 Abnormal electrocardiogram [ECG] [EKG]: Secondary | ICD-10-CM | POA: Diagnosis not present

## 2017-10-22 DIAGNOSIS — R0789 Other chest pain: Secondary | ICD-10-CM | POA: Insufficient documentation

## 2017-10-22 DIAGNOSIS — R51 Headache: Secondary | ICD-10-CM | POA: Diagnosis not present

## 2017-10-22 NOTE — ED Triage Notes (Signed)
Patient states that she has some chest pain starting about 2 hours ago. It is intermittent  - patient states that when she take a deep breath it hurts in her chest. The patient also reports that she has had a headache and "foggy" feeling for about an hour

## 2017-10-23 LAB — CBC WITH DIFFERENTIAL/PLATELET
BASOS ABS: 0 10*3/uL (ref 0.0–0.1)
BASOS PCT: 0 %
EOS PCT: 1 %
Eosinophils Absolute: 0.1 10*3/uL (ref 0.0–0.7)
HEMATOCRIT: 32.1 % — AB (ref 36.0–46.0)
Hemoglobin: 11.2 g/dL — ABNORMAL LOW (ref 12.0–15.0)
LYMPHS PCT: 47 %
Lymphs Abs: 3.1 10*3/uL (ref 0.7–4.0)
MCH: 27.3 pg (ref 26.0–34.0)
MCHC: 34.9 g/dL (ref 30.0–36.0)
MCV: 78.1 fL (ref 78.0–100.0)
MONO ABS: 0.6 10*3/uL (ref 0.1–1.0)
Monocytes Relative: 9 %
NEUTROS ABS: 2.8 10*3/uL (ref 1.7–7.7)
Neutrophils Relative %: 43 %
PLATELETS: 266 10*3/uL (ref 150–400)
RBC: 4.11 MIL/uL (ref 3.87–5.11)
RDW: 13.5 % (ref 11.5–15.5)
WBC: 6.6 10*3/uL (ref 4.0–10.5)

## 2017-10-23 LAB — BASIC METABOLIC PANEL
ANION GAP: 7 (ref 5–15)
BUN: 23 mg/dL — ABNORMAL HIGH (ref 6–20)
CALCIUM: 9.1 mg/dL (ref 8.9–10.3)
CO2: 27 mmol/L (ref 22–32)
Chloride: 107 mmol/L (ref 101–111)
Creatinine, Ser: 0.86 mg/dL (ref 0.44–1.00)
Glucose, Bld: 101 mg/dL — ABNORMAL HIGH (ref 65–99)
Potassium: 3.6 mmol/L (ref 3.5–5.1)
Sodium: 141 mmol/L (ref 135–145)

## 2017-10-23 LAB — TROPONIN I: Troponin I: <0.03 ng/mL (ref <0.03)

## 2017-10-23 MED ORDER — KETOROLAC TROMETHAMINE 30 MG/ML IJ SOLN
30.0000 mg | Freq: Once | INTRAMUSCULAR | Status: DC
  Filled 2017-10-23: qty 1

## 2017-10-23 MED ORDER — NAPROXEN 500 MG PO TABS: 500.0000 mg | ORAL_TABLET | Freq: Two times a day (BID) | ORAL | 0 refills | Status: DC

## 2017-10-23 MED ORDER — KETOROLAC TROMETHAMINE 30 MG/ML IJ SOLN
30.0000 mg | Freq: Once | INTRAMUSCULAR | Status: AC
Start: 2017-10-23 — End: 2017-10-23
  Administered 2017-10-23: 30 mg via INTRAVENOUS

## 2017-10-23 NOTE — Discharge Instructions (Signed)
He was seen today for chest pain and headache.  Your work-up is reassuring.  Take naproxen for pain.  Follow-up with cardiology if you have recurrent chest discomfort.

## 2017-10-23 NOTE — ED Notes (Addendum)
Alert, NAD, calm, interactive, resps e/u, speaking in clear complete sentences, no dyspnea noted, skin W&D, VSS, c/o CP, HA, nausea, admits to diarrhea, (denies: sob, dizziness, fever). States, "does not feel like Pierz crisis".

## 2017-10-23 NOTE — ED Provider Notes (Signed)
[facility] EMERGENCY DEPARTMENT Provider Note   CSN: [id number] Arrival date & time: 10/22/17  2035     History   Chief Complaint Chief Complaint  Patient presents with  . Headache    HPI Barbara [name] is a 57 y.o. female.  HPI  This is a 57 year old female with a history of sickle cell anemia who presents with chest pain.  Patient reports onset of sharp anterior chest pain that was nonradiating at 6 PM.  She states that it lasted for several hours.  Did not seem to be worsened with movement or exertion.  She denies any recent cough, fever, shortness of breath.  Denies any lower extremity swelling or history of blood clots.  She states now "I only feel pressure."  She rates her pain a 2 out of 10.  She reports full cardiac work-up including cardiac catheterization in 2016 when she had similar symptoms.  Patient reports associated headache.  Denies vision changes, weakness, numbness, tingling, focal deficits.  Chart review with normal cardiac catheterization in 2016.  Past Medical History:  Diagnosis Date  . GERD (gastroesophageal reflux disease)   . Low iron   . Sickle cell anemia [facility])     Patient Active Problem List   Diagnosis Date Noted  . GERD (gastroesophageal reflux disease) 08/27/2014  . Chest pain 08/26/2014    Past Surgical History:  Procedure Laterality Date  . ANTERIOR CRUCIATE LIGAMENT REPAIR    . CHOLECYSTECTOMY    . ENDOMETRIAL ABLATION    . LEFT HEART CATHETERIZATION WITH CORONARY ANGIOGRAM N/A 08/27/2014   Procedure: LEFT HEART CATHETERIZATION WITH CORONARY ANGIOGRAM;  Surgeon: Iran Ouch, MD;  Location: [facility] CATH LAB;  Service: Cardiovascular;  Laterality: N/A;     OB History   None      Home Medications    Prior to Admission medications   Medication Sig Start Date End Date Taking? Authorizing Provider  calcium carbonate (TUMS - DOSED IN MG ELEMENTAL CALCIUM) 500 MG chewable tablet Chew 1 tablet by mouth daily as needed for  indigestion or heartburn.    [provider]  cephALEXin (KEFLEX) 500 MG capsule Take 1 capsule (500 mg total) by mouth 3 (three) times daily. 05/06/17   Rise Mu, PA-C  cyclobenzaprine (FLEXERIL) 5 MG tablet Take 1 tablet (5 mg total) by mouth 2 (two) times daily as needed for muscle spasms. 05/05/17   Caccavale, Sophia, PA-C  dicyclomine (BENTYL) 20 MG tablet Take 1 tablet (20 mg total) by mouth 2 (two) times daily. Patient not taking: Reported on 05/12/2017 08/15/16   Nicanor Alcon, April, MD  HYDROcodone-acetaminophen (NORCO/VICODIN) 5-325 MG per tablet Take 1 tablet by mouth every 6 (six) hours as needed for moderate pain. Patient not taking: Reported on 05/12/2017 12/11/14   Elwin Mocha, MD  HYDROcodone-acetaminophen (NORCO/VICODIN) 5-325 MG tablet Take 1-2 tablets by mouth every 6 (six) hours as needed for moderate pain. 05/12/17   Vanetta Mulders, MD  Ibuprofen-Diphenhydramine HCl (ADVIL PM) 200-25 MG CAPS Take 1 capsule by mouth at bedtime as needed and may repeat dose one time if needed (pain, sleep).    [provider]  lidocaine (LIDODERM) 5 % Place 1 patch onto the skin daily. Remove & Discard patch within 12 hours or as directed by MD 05/05/17   Caccavale, Sophia, PA-C  loratadine (CLARITIN) 10 MG tablet Take 10 mg by mouth daily.    [provider]  meloxicam (MOBIC) 7.5 MG tablet Take 1 tablet (7.5 mg total) by mouth  daily as needed. Avoid/Limit use if possible Patient taking differently: Take 15 mg by mouth daily as needed for pain. Avoid/Limit use if possible 08/28/14   Zannie CoveJoseph, Preetha, MD  naproxen (NAPROSYN) 500 MG tablet Take 1 tablet (500 mg total) by mouth 2 (two) times daily. 05/12/17   Vanetta MuldersZackowski, Scott, MD  naproxen (NAPROSYN) 500 MG tablet Take 1 tablet (500 mg total) by mouth 2 (two) times daily. 10/23/17   Ane Conerly, Mayer Maskerourtney F, MD  omeprazole (PRILOSEC) 20 MG capsule Take 20 mg by mouth daily.    [provider]  ondansetron (ZOFRAN ODT)  4 MG disintegrating tablet Take 1 tablet (4 mg total) by mouth every 6 (six) hours as needed for nausea or vomiting. Patient not taking: Reported on 05/12/2017 12/11/14   Elwin MochaWalden, Blair, MD  pantoprazole (PROTONIX) 40 MG tablet Take 1 tablet (40 mg total) by mouth daily. Patient not taking: Reported on 05/12/2017 08/15/16   Palumbo, April, MD  predniSONE (DELTASONE) 20 MG tablet 3 po once a day for 2 days, then 2 po once a day for 2 days, then 1 po once a day for 2 days Patient not taking: Reported on 05/12/2017 12/18/16   Cathren LaineSteinl, Kevin, MD  sucralfate (CARAFATE) 1 GM/10ML suspension Take 10 mLs (1 g total) by mouth 4 (four) times daily -  with meals and at bedtime. Patient not taking: Reported on 05/12/2017 08/15/16   Palumbo, April, MD  traMADol (ULTRAM) 50 MG tablet Take 1 tablet (50 mg total) by mouth every 12 (twelve) hours as needed for severe pain. 05/05/17   Caccavale, Sophia, PA-C    Family History Family History  Problem Relation Age of Onset  . CAD Brother 2945       CABG at age 56, and deceased  . CAD Sister 50       1 stent  . CAD Father 5350       Deceased in 3250s    Social History Social History   Tobacco Use  . Smoking status: Former Smoker    Years: 1.00    Types: Cigarettes    Last attempt to quit: 08/26/2013    Years since quitting: 4.1  . Smokeless tobacco: Never Used  Substance Use Topics  . Alcohol use: Yes    Comment: occasionally, glass of wine  . Drug use: No     Allergies   Iodine; Morphine and related; Other; and Sulfa antibiotics   Review of Systems Review of Systems  Constitutional: Negative for fever.  Eyes: Negative for photophobia.  Respiratory: Negative for cough and shortness of breath.   Cardiovascular: Positive for chest pain. Negative for leg swelling.  Gastrointestinal: Negative for abdominal pain, diarrhea, nausea and vomiting.  Genitourinary: Negative for dysuria.  Neurological: Positive for headaches. Negative for speech difficulty.    All other systems reviewed and are negative.    Physical Exam Updated Vital Signs BP (!) 152/86   Pulse 65   Temp 98.1 F (36.7 C) (Oral)   Resp 16   Ht 5\' 9"  (1.753 m)   Wt 77.1 kg (170 lb)   SpO2 100%   BMI 25.10 kg/m   Physical Exam  Constitutional: She is oriented to person, place, and time. She appears well-developed and well-nourished. No distress.  HENT:  Head: Normocephalic and atraumatic.  Eyes: Pupils are equal, round, and reactive to light.  Neck: Normal range of motion. Neck supple.  Cardiovascular: Normal rate, regular rhythm and normal heart sounds.  Anterior tenderness palpation, no crepitus  Pulmonary/Chest: Effort normal. No respiratory distress. She has no wheezes.  Abdominal: Soft. Bowel sounds are normal.  Musculoskeletal: She exhibits no edema.  Neurological: She is alert and oriented to person, place, and time.  Skin: Skin is warm and dry.  Psychiatric: She has a normal mood and affect.  Nursing note and vitals reviewed.    ED Treatments / Results  Labs (all labs ordered are listed, but only abnormal results are displayed) Labs Reviewed  CBC WITH DIFFERENTIAL/PLATELET - Abnormal; Notable for the following components:      Result Value   Hemoglobin 11.2 (*)    HCT 32.1 (*)    All other components within normal limits  BASIC METABOLIC PANEL - Abnormal; Notable for the following components:   Glucose, Bld 101 (*)    BUN 23 (*)    All other components within normal limits  TROPONIN I    EKG EKG Interpretation  Date/Time:  "Sunday October 22 2017 20:45:10 EDT Ventricular Rate:  81 PR Interval:  154 QRS Duration: 78 QT Interval:  396 QTC Calculation: 460 R Axis:   35 Text Interpretation:  Normal sinus rhythm T wave abnormalities similar to prior Prolonged QT Abnormal ECG Confirmed by Alexios Keown ([id number]) on 10/23/2017 12:09:19 AM   Radiology Dg Chest 2 View  Result Date: 10/22/2017 CLINICAL DATA:  Sharp chest pain for several hours  EXAM: CHEST - 2 VIEW COMPARISON:  08/15/2016 FINDINGS: The heart size and mediastinal contours are within normal limits. Both lungs are clear. The visualized skeletal structures are unremarkable. IMPRESSION: No active cardiopulmonary disease. Electronically Signed   By: Mark  Lukens M.D.   On: 10/22/2017 21:31    Procedures Procedures (including critical care time)  Medications Ordered in ED Medications  ketorolac (TORADOL) 30 MG/ML injection 30 mg (30 mg Intravenous Given 10/23/17 0051)     Initial Impression / Assessment and Plan / ED Course  I have reviewed the triage vital signs and the nursing notes.  Pertinent labs & imaging results that were available during my care of the patient were reviewed by me and considered in my medical decision making (see chart for details).     She presents with chest pain and headache.  She is overall nontoxic-appearing on exam and vital signs notable for blood pressure of 152/86.  She is reproducible chest pain on exam.  Chest pain is very atypical.  Doubt ACS but she does have some risk factors.  EKG is nonischemic.  Chest x-ray showed no evidence of pneumothorax or pneumonia.  Basic lab work including troponin is reassuring.  Doubt PE as patient's vital signs with heart rate of 65 and SPO2 of 100%.  No other risk factors.  Regarding headache, patient much improved after Toradol.  Doubt acute emergent process.  Recommend cardiology follow-up.  After history, exam, and medical workup I feel the patient has been appropriately medically screened and is safe for discharge home. Pertinent diagnoses were discussed with the patient. Patient was given return precautions.   Final Clinical Impressions(s) / ED Diagnoses   Final diagnoses:  Atypical chest pain  Acute non intractable tension-type headache    ED Discharge Orders        Ordered    naproxen (NAPROSYN) 500 MG tablet  2 times daily     06" /03/19 0132       Alyene Predmore, Mayer Masker, MD 10/23/17  [phone]

## 2017-10-23 NOTE — ED Notes (Signed)
Pt discharged to home with family. NAD.  

## 2017-10-27 ENCOUNTER — Ambulatory Visit: Payer: BLUE CROSS/BLUE SHIELD | Admitting: Cardiology

## 2017-10-27 ENCOUNTER — Encounter: Payer: Self-pay | Admitting: Cardiology

## 2017-10-27 VITALS — BP 122/72 | HR 81 | Ht 69.0 in | Wt 174.0 lb

## 2017-10-27 DIAGNOSIS — D5700 Hb-SS disease with crisis, unspecified: Secondary | ICD-10-CM | POA: Insufficient documentation

## 2017-10-27 DIAGNOSIS — D571 Sickle-cell disease without crisis: Secondary | ICD-10-CM | POA: Insufficient documentation

## 2017-10-27 DIAGNOSIS — R079 Chest pain, unspecified: Secondary | ICD-10-CM

## 2017-10-27 DIAGNOSIS — D573 Sickle-cell trait: Secondary | ICD-10-CM | POA: Insufficient documentation

## 2017-10-27 NOTE — Progress Notes (Signed)
Cardiology Office Note:    Date:  10/27/2017   ID:  Barbara [name], DOB January 26, 1961, MRN [id number]  PCP:  Patient, No Pcp Per  Cardiologist:  Garwin Brothers, MD   Referring MD: No ref. provider found    ASSESSMENT:    1. Chest pain, unspecified type    PLAN:    In order of problems listed above:  1. I reviewed the findings on my exam with the patient today.  She is overall a healthy female.  She is a very active lady.  Her symptoms are not very concerning from an objective standpoint.  In view of this I have set her up for an exercise stress echo.  I will see her in follow-up appointment in 2 months or earlier if she has any concerns.  I told her to get established with a primary care physician.  She has had blood work done at her workplace and will get me a copy and I will review her blood work including lipids. 2. 2 months follow-up or earlier if she has any concerns   Medication Adjustments/Labs and Tests Ordered: Current medicines are reviewed at length with the patient today.  Concerns regarding medicines are outlined above.  No orders of the defined types were placed in this encounter.  No orders of the defined types were placed in this encounter.    History of Present Illness:    Barbara [name] is a 57 y.o. female who is being seen today for the evaluation of chest pain at the request of the emergency room.  Poorly patient is a pleasant 57 year old female.  She mentions to me that she has no known risk factors for coronary artery disease.  Several years ago she underwent coronary angiography and was told that it was fine.  She works at [employer] and mentions to me that she walks about 20-25,000 steps on a regular basis.  She says the other day she had chest pain like a stabbing-like sensatio in the chest with no radiation.  This scared her and she went to the emergency room.  There she was evaluated and sent home and subsequently has done fine.  She mentions to me that at that  time she had some chest pain when she was taking deep breaths.  Today I tried to make her reproduce this but with deep breath she had no problems.  Past Medical History:  Diagnosis Date  . GERD (gastroesophageal reflux disease)   . Low iron   . Sickle cell anemia (HCC)     Past Surgical History:  Procedure Laterality Date  . ANTERIOR CRUCIATE LIGAMENT REPAIR    . CHOLECYSTECTOMY    . ENDOMETRIAL ABLATION    . LEFT HEART CATHETERIZATION WITH CORONARY ANGIOGRAM N/A 08/27/2014   Procedure: LEFT HEART CATHETERIZATION WITH CORONARY ANGIOGRAM;  Surgeon: Iran Ouch, MD;  Location: [facility] CATH LAB;  Service: Cardiovascular;  Laterality: N/A;    Current Medications: Current Meds  Medication Sig  . calcium carbonate (TUMS - DOSED IN MG ELEMENTAL CALCIUM) 500 MG chewable tablet Chew 1 tablet by mouth daily as needed for indigestion or heartburn.  . lidocaine (LIDODERM) 5 % Place 1 patch onto the skin daily. Remove & Discard patch within 12 hours or as directed by MD (Patient taking differently: Place 1 patch onto the skin as needed. Remove & Discard patch within 12 hours or as directed by MD)  . loratadine (CLARITIN) 10 MG tablet Take 10 mg by mouth as needed.   Marland Kitchen  meloxicam (MOBIC) 15 MG tablet Take 1 tablet by mouth daily.  Marland Kitchen. omeprazole (PRILOSEC) 20 MG capsule Take 20 mg by mouth as needed.   . sucralfate (CARAFATE) 1 GM/10ML suspension Take 10 mLs (1 g total) by mouth 4 (four) times daily -  with meals and at bedtime.     Allergies:   Iodine; Morphine and related; Other; Sulfa antibiotics; and Sulfamethoxazole-trimethoprim   Social History   Socioeconomic History  . Marital status: Divorced    Spouse name: Not on file  . Number of children: Not on file  . Years of education: Not on file  . Highest education level: Not on file  Occupational History  . Not on file  Social Needs  . Financial resource strain: Not on file  . Food insecurity:    Worry: Not on file    Inability: Not on  file  . Transportation needs:    Medical: Not on file    Non-medical: Not on file  Tobacco Use  . Smoking status: Former Smoker    Years: 1.00    Types: Cigarettes    Last attempt to quit: 08/26/2013    Years since quitting: 4.1  . Smokeless tobacco: Never Used  Substance and Sexual Activity  . Alcohol use: Yes    Comment: occasionally, glass of wine  . Drug use: No  . Sexual activity: Not on file  Lifestyle  . Physical activity:    Days per week: Not on file    Minutes per session: Not on file  . Stress: Not on file  Relationships  . Social connections:    Talks on phone: Not on file    Gets together: Not on file    Attends religious service: Not on file    Active member of club or organization: Not on file    Attends meetings of clubs or organizations: Not on file    Relationship status: Not on file  Other Topics Concern  . Not on file  Social History Narrative  . Not on file     Family History: The patient's family history includes CAD (age of onset: 8145) in her brother; CAD (age of onset: 4950) in her father and sister.  ROS:   Please see the history of present illness.    All other systems reviewed and are negative.  EKGs/Labs/Other Studies Reviewed:    The following studies were reviewed today: EKG done today reveals sinus rhythm and nonspecific ST T changes   Recent Labs: 05/06/2017: ALT 16 10/23/2017: BUN 23; Creatinine, Ser 0.86; Hemoglobin 11.2; Platelets 266; Potassium 3.6; Sodium 141  Recent Lipid Panel No results found for: CHOL, TRIG, HDL, CHOLHDL, VLDL, LDLCALC, LDLDIRECT  Physical Exam:    VS:  BP 122/72 (BP Location: Left Arm, Patient Position: Sitting, Cuff Size: Normal)   Pulse 81   Ht 5\' 9"  (1.753 m)   Wt 174 lb (78.9 kg)   SpO2 98%   BMI 25.70 kg/m     Wt Readings from Last 3 Encounters:  10/27/17 174 lb (78.9 kg)  10/22/17 170 lb (77.1 kg)  05/06/17 160 lb (72.6 kg)     GEN: Patient is in no acute distress HEENT: Normal NECK: No  JVD; No carotid bruits LYMPHATICS: No lymphadenopathy CARDIAC: S1 S2 regular, 2/6 systolic murmur at the apex. RESPIRATORY:  Clear to auscultation without rales, wheezing or rhonchi  ABDOMEN: Soft, non-tender, non-distended MUSCULOSKELETAL:  No edema; No deformity  SKIN: Warm and dry NEUROLOGIC:  Alert and oriented  x 3 PSYCHIATRIC:  Normal affect    Signed, Garwin Brothers, MD  10/27/2017 4:30 PM    [facility]

## 2017-10-27 NOTE — Patient Instructions (Signed)
Medication Instructions:  Your physician recommends that you continue on your current medications as directed. Please refer to the Current Medication list given to you today.   Labwork: None  Testing/Procedures: You had an EKG today.   Your physician has requested that you have a stress echocardiogram. For further information please visit [email or web address]. Please follow instruction sheet as given.  Follow-Up: Your physician recommends that you schedule a follow-up appointment in: 2 months.   If you need a refill on your cardiac medications before your next appointment, please call your pharmacy.   Thank you for choosing [facility]! Mady Gemmaatherine Caid Radin, RN [phone]

## 2017-11-21 ENCOUNTER — Ambulatory Visit (HOSPITAL_BASED_OUTPATIENT_CLINIC_OR_DEPARTMENT_OTHER)
Admission: RE | Admit: 2017-11-21 | Discharge: 2017-11-21 | Disposition: A | Discharge status: Home / Self Care | Payer: BLUE CROSS/BLUE SHIELD | Source: Ambulatory Visit | Attending: Cardiology | Admitting: Cardiology

## 2017-11-21 DIAGNOSIS — R079 Chest pain, unspecified: Secondary | ICD-10-CM | POA: Insufficient documentation

## 2017-11-21 NOTE — Progress Notes (Addendum)
Echocardiogram Echocardiogram Stress Test with a limited exam has been performed.  Dorothey BasemanReel, Mycah Formica M 11/21/2017, 9:52 AM

## 2018-01-23 ENCOUNTER — Ambulatory Visit: Payer: BLUE CROSS/BLUE SHIELD | Admitting: Cardiology

## 2018-01-25 DIAGNOSIS — S339XXA Sprain of unspecified parts of lumbar spine and pelvis, initial encounter: Secondary | ICD-10-CM | POA: Diagnosis not present

## 2018-01-25 DIAGNOSIS — M5432 Sciatica, left side: Secondary | ICD-10-CM | POA: Diagnosis not present

## 2018-01-27 ENCOUNTER — Other Ambulatory Visit: Payer: Self-pay

## 2018-01-27 ENCOUNTER — Emergency Department (HOSPITAL_BASED_OUTPATIENT_CLINIC_OR_DEPARTMENT_OTHER): Payer: BLUE CROSS/BLUE SHIELD

## 2018-01-27 ENCOUNTER — Encounter (HOSPITAL_BASED_OUTPATIENT_CLINIC_OR_DEPARTMENT_OTHER): Payer: Self-pay | Admitting: *Deleted

## 2018-01-27 ENCOUNTER — Emergency Department (HOSPITAL_BASED_OUTPATIENT_CLINIC_OR_DEPARTMENT_OTHER)
Admission: EM | Admit: 2018-01-27 | Discharge: 2018-01-27 | Disposition: A | Discharge status: Home / Self Care | Payer: BLUE CROSS/BLUE SHIELD | Attending: Emergency Medicine | Admitting: Emergency Medicine

## 2018-01-27 DIAGNOSIS — R339 Retention of urine, unspecified: Secondary | ICD-10-CM | POA: Diagnosis not present

## 2018-01-27 DIAGNOSIS — M5416 Radiculopathy, lumbar region: Secondary | ICD-10-CM | POA: Diagnosis not present

## 2018-01-27 DIAGNOSIS — M549 Dorsalgia, unspecified: Secondary | ICD-10-CM | POA: Insufficient documentation

## 2018-01-27 DIAGNOSIS — E860 Dehydration: Secondary | ICD-10-CM | POA: Diagnosis not present

## 2018-01-27 LAB — CBC WITH DIFFERENTIAL/PLATELET
BASOS ABS: 0 10*3/uL (ref 0.0–0.1)
Basophils Relative: 0 %
Eosinophils Absolute: 0 10*3/uL (ref 0.0–0.7)
Eosinophils Relative: 0 %
HCT: 29.5 % — ABNORMAL LOW (ref 36.0–46.0)
Hemoglobin: 10.9 g/dL — ABNORMAL LOW (ref 12.0–15.0)
LYMPHS ABS: 4.3 10*3/uL — AB (ref 0.7–4.0)
Lymphocytes Relative: 42 %
MCH: 31.3 pg (ref 26.0–34.0)
MCHC: 36.9 g/dL — ABNORMAL HIGH (ref 30.0–36.0)
MCV: 84.8 fL (ref 78.0–100.0)
MONO ABS: 0.7 10*3/uL (ref 0.1–1.0)
MONOS PCT: 7 %
Neutro Abs: 5.3 10*3/uL (ref 1.7–7.7)
Neutrophils Relative %: 51 %
Platelets: 253 10*3/uL (ref 150–400)
RBC: 3.48 MIL/uL — AB (ref 3.87–5.11)
RDW: 16 % — AB (ref 11.5–15.5)
WBC: 10.3 10*3/uL (ref 4.0–10.5)

## 2018-01-27 LAB — COMPREHENSIVE METABOLIC PANEL
ALBUMIN: 4.2 g/dL (ref 3.5–5.0)
ALT: 22 U/L (ref 0–44)
AST: 21 U/L (ref 15–41)
Alkaline Phosphatase: 79 U/L (ref 38–126)
Anion gap: 12 (ref 5–15)
BUN: 33 mg/dL — AB (ref 6–20)
CO2: 26 mmol/L (ref 22–32)
Calcium: 8.8 mg/dL — ABNORMAL LOW (ref 8.9–10.3)
Chloride: 101 mmol/L (ref 98–111)
Creatinine, Ser: 1.2 mg/dL — ABNORMAL HIGH (ref 0.44–1.00)
GFR calc Af Amer: 57 mL/min — ABNORMAL LOW (ref >60)
GFR calc non Af Amer: 50 mL/min — ABNORMAL LOW (ref >60)
GLUCOSE: 97 mg/dL (ref 70–99)
POTASSIUM: 3.4 mmol/L — AB (ref 3.5–5.1)
Sodium: 139 mmol/L (ref 135–145)
Total Bilirubin: 0.5 mg/dL (ref 0.3–1.2)
Total Protein: 7.6 g/dL (ref 6.5–8.1)

## 2018-01-27 LAB — URINALYSIS, ROUTINE W REFLEX MICROSCOPIC
BILIRUBIN URINE: NEGATIVE
GLUCOSE, UA: NEGATIVE mg/dL
Hgb urine dipstick: NEGATIVE
KETONES UR: 15 mg/dL — AB
Leukocytes, UA: NEGATIVE
Nitrite: NEGATIVE
Protein, ur: NEGATIVE mg/dL
SPECIFIC GRAVITY, URINE: 1.025 (ref 1.005–1.030)
pH: 6 (ref 5.0–8.0)

## 2018-01-27 MED ORDER — FENTANYL CITRATE (PF) 100 MCG/2ML IJ SOLN
50.0000 ug | Freq: Once | INTRAMUSCULAR | Status: AC
  Administered 2018-01-27: 50 ug via INTRAVENOUS
  Filled 2018-01-27: qty 2

## 2018-01-27 MED ORDER — HYDROCODONE-ACETAMINOPHEN 5-325 MG PO TABS: 1.0000 | ORAL_TABLET | Freq: Four times a day (QID) | ORAL | 0 refills | Status: DC | PRN

## 2018-01-27 MED ORDER — SODIUM CHLORIDE 0.9 % IV BOLUS
1000.0000 mL | Freq: Once | INTRAVENOUS | Status: AC
  Administered 2018-01-27: 1000 mL via INTRAVENOUS

## 2018-01-27 NOTE — Discharge Instructions (Signed)
You have mild dehydration.   Stay hydrated.   Continue your steroids, muscle relaxants.   Take vicodin for severe pain.   You have some pinched nerves in your back causing your symptoms. You need to call neurosurgeon in 2 days for appointment   Return to ER if you have worse back pain, trouble urinating, trouble walking, weakness, numbness

## 2018-01-27 NOTE — ED Provider Notes (Signed)
[facility] EMERGENCY DEPARTMENT Provider Note   CSN: [id number] Arrival date & time: 01/27/18  1524     History   Chief Complaint Chief Complaint  Patient presents with  . Back Pain    HPI Barbara [name] is a 57 y.o. female history of sickle cell anemia, reflux here presenting with back pain, trouble urinating.  Patient states that she has a known bulging disc that was diagnosed several months ago on the CT scan.  She states that she had worsening back pain for the last 4 days.  She went to urgent care 2 days ago and was given prednisone and Flexeril and meloxicam.  She states that since yesterday she has a feeling that she was unable to fully empty her bladder.  She denies any fecal incontinence.  She states that the pain does radiate down both legs but denies any numbness of her legs.  Denies any weakness or trouble walking.  She has not seen a spine doctor yet.  The history is provided by the patient.    Past Medical History:  Diagnosis Date  . GERD (gastroesophageal reflux disease)   . Low iron   . Sickle cell anemia [facility])     Patient Active Problem List   Diagnosis Date Noted  . Sickle cell disease (HCC) 10/27/2017  . Strain of trapezius muscle 07/21/2017  . Shoulder strain 05/29/2017  . GERD (gastroesophageal reflux disease) 08/27/2014  . Chest pain 08/26/2014    Past Surgical History:  Procedure Laterality Date  . ANTERIOR CRUCIATE LIGAMENT REPAIR    . CHOLECYSTECTOMY    . ENDOMETRIAL ABLATION    . LEFT HEART CATHETERIZATION WITH CORONARY ANGIOGRAM N/A 08/27/2014   Procedure: LEFT HEART CATHETERIZATION WITH CORONARY ANGIOGRAM;  Surgeon: Iran Ouch, MD;  Location: [facility] CATH LAB;  Service: Cardiovascular;  Laterality: N/A;     OB History   None      Home Medications    Prior to Admission medications   Medication Sig Start Date End Date Taking? Authorizing Provider  calcium carbonate (TUMS - DOSED IN MG ELEMENTAL CALCIUM) 500 MG chewable tablet  Chew 1 tablet by mouth daily as needed for indigestion or heartburn.    [provider]  lidocaine (LIDODERM) 5 % Place 1 patch onto the skin daily. Remove & Discard patch within 12 hours or as directed by MD Patient taking differently: Place 1 patch onto the skin as needed. Remove & Discard patch within 12 hours or as directed by MD 05/05/17   Caccavale, Sophia, PA-C  loratadine (CLARITIN) 10 MG tablet Take 10 mg by mouth as needed.     [provider]  meloxicam (MOBIC) 15 MG tablet Take 1 tablet by mouth daily. 09/19/17   [provider]  omeprazole (PRILOSEC) 20 MG capsule Take 20 mg by mouth as needed.     [provider]  sucralfate (CARAFATE) 1 GM/10ML suspension Take 10 mLs (1 g total) by mouth 4 (four) times daily -  with meals and at bedtime. 08/15/16   Palumbo, April, MD    Family History Family History  Problem Relation Age of Onset  . CAD Brother 61       CABG at age 103, and deceased  . CAD Sister 50       1 stent  . CAD Father 36       Deceased in 80s    Social History Social History   Tobacco Use  . Smoking status: Former Smoker  Years: 1.00    Types: Cigarettes    Last attempt to quit: 08/26/2013    Years since quitting: 4.4  . Smokeless tobacco: Never Used  Substance Use Topics  . Alcohol use: Yes    Comment: occasionally, glass of wine  . Drug use: No     Allergies   Iodine; Morphine and related; Other; Sulfa antibiotics; and Sulfamethoxazole-trimethoprim   Review of Systems Review of Systems  Musculoskeletal: Positive for back pain.  All other systems reviewed and are negative.    Physical Exam Updated Vital Signs BP 127/81 (BP Location: Left Arm)   Pulse 90   Temp 98.3 F (36.8 C) (Oral)   Resp 20   Ht 5\' 9"  (1.753 m)   Wt 76.2 kg   SpO2 100%   BMI 24.81 kg/m   Physical Exam  Constitutional: She is oriented to person, place, and time. She appears well-developed and well-nourished.  HENT:  Head:  Normocephalic.  Mouth/Throat: Oropharynx is clear and moist.  Eyes: Pupils are equal, round, and reactive to light. Conjunctivae and EOM are normal.  Neck: Normal range of motion. Neck supple.  Cardiovascular: Normal rate, regular rhythm and normal heart sounds.  Pulmonary/Chest: Effort normal and breath sounds normal. No stridor. No respiratory distress. She has no wheezes.  Abdominal: Soft. Bowel sounds are normal. She exhibits no distension. There is no tenderness. There is no guarding.  Musculoskeletal:  Mild L paralumbar tenderness, no CVAT. + straight leg raise L leg. No saddle anesthesia   Neurological: She is alert and oriented to person, place, and time.  No saddle anesthesia. Nl hip flexion and extension and knee flexion and extension. Nl reflexes bilateral knees.   Skin: Skin is warm.  Psychiatric: She has a normal mood and affect.  Nursing note and vitals reviewed.    ED Treatments / Results  Labs (all labs ordered are listed, but only abnormal results are displayed) Labs Reviewed  URINALYSIS, ROUTINE W REFLEX MICROSCOPIC - Abnormal; Notable for the following components:      Result Value   APPearance CLOUDY (*)    Ketones, ur 15 (*)    All other components within normal limits  CBC WITH DIFFERENTIAL/PLATELET - Abnormal; Notable for the following components:   RBC 3.48 (*)    Hemoglobin 10.9 (*)    HCT 29.5 (*)    MCHC 36.9 (*)    RDW 16.0 (*)    All other components within normal limits  COMPREHENSIVE METABOLIC PANEL    EKG None  Radiology No results found.  Procedures Procedures (including critical care time)  Medications Ordered in ED Medications  fentaNYL (SUBLIMAZE) injection 50 mcg (50 mcg Intravenous Given 01/27/18 1605)     Initial Impression / Assessment and Plan / ED Course  I have reviewed the triage vital signs and the nursing notes.  Pertinent labs & imaging results that were available during my care of the patient were reviewed by me and  considered in my medical decision making (see chart for details).     Barbara [name] is a 57 y.o. female here with back pain. Likely radiculopathy. She does have subjective feeling of inability to empty her bladder. However, her post void residual is around 200 cc so she is not in retention. She has no saddle anesthesia and ambulated well in the ED and has normal reflexes. Will get CT lumbar spine, UA. She is already on steroids and muscle relaxants. May need spine surgeon referral.   5:10 PM Cr slightly  elevated at 1.2, BUN up to 33. UA showed some ketones. CT showed impingement at L4-5 and L5-S1. Will dc home with pain meds, spine follow up.   Final Clinical Impressions(s) / ED Diagnoses   Final diagnoses:  None    ED Discharge Orders    None       Charlynne Pander, MD 01/27/18 1746

## 2018-01-27 NOTE — ED Triage Notes (Signed)
Pt reports low back pain since Tuesday. States pain is radiating down left leg and she has hx of bulging disc. States she feels like she's not fully emptying her bladder. States he was seen at Urgent Care on Thursday and was started on prednisone and flexeril. She is also taking meloxicam. Ambulatory to triage with slow steady gait.

## 2018-02-08 DIAGNOSIS — M5416 Radiculopathy, lumbar region: Secondary | ICD-10-CM | POA: Diagnosis not present

## 2018-02-08 DIAGNOSIS — Z6825 Body mass index (BMI) 25.0-25.9, adult: Secondary | ICD-10-CM | POA: Diagnosis not present

## 2018-02-08 DIAGNOSIS — R030 Elevated blood-pressure reading, without diagnosis of hypertension: Secondary | ICD-10-CM | POA: Diagnosis not present

## 2018-02-22 DIAGNOSIS — M5416 Radiculopathy, lumbar region: Secondary | ICD-10-CM | POA: Diagnosis not present

## 2018-02-22 DIAGNOSIS — M545 Low back pain: Secondary | ICD-10-CM | POA: Diagnosis not present

## 2018-03-07 DIAGNOSIS — M5416 Radiculopathy, lumbar region: Secondary | ICD-10-CM | POA: Diagnosis not present

## 2018-03-09 ENCOUNTER — Other Ambulatory Visit: Payer: Self-pay | Admitting: Neurosurgery

## 2018-03-09 DIAGNOSIS — M5416 Radiculopathy, lumbar region: Secondary | ICD-10-CM

## 2018-03-12 ENCOUNTER — Telehealth: Payer: Self-pay | Admitting: Nurse Practitioner

## 2018-03-12 NOTE — Telephone Encounter (Signed)
Pt reports she has tolerated contrast studies with premedication. Called in 13 hr prep for patient's ESI on 10/31 at 1330. Pt verbalized understanding of instructions.  0030- 50mg  Prednisone 0630- 50mg  Prednisone 1230- 50mg  Prednisone and 50mg  Benadryl

## 2018-03-22 ENCOUNTER — Ambulatory Visit
Admission: RE | Admit: 2018-03-22 | Discharge: 2018-03-22 | Disposition: A | Discharge status: Home / Self Care | Payer: BLUE CROSS/BLUE SHIELD | Source: Ambulatory Visit | Attending: Neurosurgery | Admitting: Neurosurgery

## 2018-03-22 DIAGNOSIS — M5126 Other intervertebral disc displacement, lumbar region: Secondary | ICD-10-CM | POA: Diagnosis not present

## 2018-03-22 DIAGNOSIS — M5416 Radiculopathy, lumbar region: Secondary | ICD-10-CM

## 2018-03-22 MED ORDER — IOPAMIDOL (ISOVUE-M 200) INJECTION 41%
1.0000 mL | Freq: Once | INTRAMUSCULAR | Status: AC
  Administered 2018-03-22: 1 mL via EPIDURAL

## 2018-03-22 MED ORDER — METHYLPREDNISOLONE ACETATE 40 MG/ML INJ SUSP (RADIOLOG
120.0000 mg | Freq: Once | INTRAMUSCULAR | Status: AC
  Administered 2018-03-22: 120 mg via EPIDURAL

## 2018-03-22 NOTE — Discharge Instructions (Signed)

## 2018-07-06 DIAGNOSIS — Z713 Dietary counseling and surveillance: Secondary | ICD-10-CM | POA: Diagnosis not present

## 2018-07-06 DIAGNOSIS — Z136 Encounter for screening for cardiovascular disorders: Secondary | ICD-10-CM | POA: Diagnosis not present

## 2018-07-06 DIAGNOSIS — Z131 Encounter for screening for diabetes mellitus: Secondary | ICD-10-CM | POA: Diagnosis not present

## 2018-07-06 DIAGNOSIS — Z13220 Encounter for screening for lipoid disorders: Secondary | ICD-10-CM | POA: Diagnosis not present

## 2018-07-17 ENCOUNTER — Other Ambulatory Visit: Payer: Self-pay

## 2018-07-17 ENCOUNTER — Emergency Department (HOSPITAL_BASED_OUTPATIENT_CLINIC_OR_DEPARTMENT_OTHER)
Admission: EM | Admit: 2018-07-17 | Discharge: 2018-07-18 | Disposition: A | Discharge status: Home / Self Care | Payer: BLUE CROSS/BLUE SHIELD | Attending: Emergency Medicine | Admitting: Emergency Medicine

## 2018-07-17 ENCOUNTER — Encounter (HOSPITAL_BASED_OUTPATIENT_CLINIC_OR_DEPARTMENT_OTHER): Payer: Self-pay | Admitting: *Deleted

## 2018-07-17 ENCOUNTER — Emergency Department (HOSPITAL_BASED_OUTPATIENT_CLINIC_OR_DEPARTMENT_OTHER): Payer: BLUE CROSS/BLUE SHIELD

## 2018-07-17 DIAGNOSIS — Z79899 Other long term (current) drug therapy: Secondary | ICD-10-CM | POA: Insufficient documentation

## 2018-07-17 DIAGNOSIS — Z87891 Personal history of nicotine dependence: Secondary | ICD-10-CM | POA: Insufficient documentation

## 2018-07-17 DIAGNOSIS — K5900 Constipation, unspecified: Secondary | ICD-10-CM | POA: Diagnosis not present

## 2018-07-17 DIAGNOSIS — Z9049 Acquired absence of other specified parts of digestive tract: Secondary | ICD-10-CM | POA: Diagnosis not present

## 2018-07-17 DIAGNOSIS — R109 Unspecified abdominal pain: Secondary | ICD-10-CM | POA: Diagnosis not present

## 2018-07-17 DIAGNOSIS — R140 Abdominal distension (gaseous): Secondary | ICD-10-CM | POA: Diagnosis not present

## 2018-07-17 LAB — COMPREHENSIVE METABOLIC PANEL
ALK PHOS: 85 U/L (ref 38–126)
ALT: 21 U/L (ref 0–44)
ANION GAP: 9 (ref 5–15)
AST: 28 U/L (ref 15–41)
Albumin: 4.4 g/dL (ref 3.5–5.0)
BILIRUBIN TOTAL: 0.7 mg/dL (ref 0.3–1.2)
BUN: 15 mg/dL (ref 6–20)
CALCIUM: 8.8 mg/dL — AB (ref 8.9–10.3)
CO2: 23 mmol/L (ref 22–32)
Chloride: 105 mmol/L (ref 98–111)
Creatinine, Ser: 0.84 mg/dL (ref 0.44–1.00)
Glucose, Bld: 107 mg/dL — ABNORMAL HIGH (ref 70–99)
Potassium: 3.9 mmol/L (ref 3.5–5.1)
SODIUM: 137 mmol/L (ref 135–145)
Total Protein: 8 g/dL (ref 6.5–8.1)

## 2018-07-17 LAB — URINALYSIS, ROUTINE W REFLEX MICROSCOPIC
Bilirubin Urine: NEGATIVE
GLUCOSE, UA: NEGATIVE mg/dL
Hgb urine dipstick: NEGATIVE
KETONES UR: 15 mg/dL — AB
NITRITE: NEGATIVE
PROTEIN: NEGATIVE mg/dL
Specific Gravity, Urine: 1.015 (ref 1.005–1.030)
pH: 6.5 (ref 5.0–8.0)

## 2018-07-17 LAB — CBC WITH DIFFERENTIAL/PLATELET
Abs Immature Granulocytes: 0.02 10*3/uL (ref 0.00–0.07)
BASOS ABS: 0 10*3/uL (ref 0.0–0.1)
Basophils Relative: 0 %
EOS PCT: 1 %
Eosinophils Absolute: 0.1 10*3/uL (ref 0.0–0.5)
HEMATOCRIT: 35.7 % — AB (ref 36.0–46.0)
Hemoglobin: 11.9 g/dL — ABNORMAL LOW (ref 12.0–15.0)
Immature Granulocytes: 0 %
Lymphocytes Relative: 26 %
Lymphs Abs: 2.5 10*3/uL (ref 0.7–4.0)
MCH: 27.6 pg (ref 26.0–34.0)
MCHC: 33.3 g/dL (ref 30.0–36.0)
MCV: 82.8 fL (ref 80.0–100.0)
Monocytes Absolute: 0.6 10*3/uL (ref 0.1–1.0)
Monocytes Relative: 7 %
NRBC: 0 % (ref 0.0–0.2)
Neutro Abs: 6.3 10*3/uL (ref 1.7–7.7)
Neutrophils Relative %: 66 %
PLATELETS: 250 10*3/uL (ref 150–400)
RBC: 4.31 MIL/uL (ref 3.87–5.11)
RDW: 13.2 % (ref 11.5–15.5)
WBC: 9.6 10*3/uL (ref 4.0–10.5)

## 2018-07-17 LAB — URINALYSIS, MICROSCOPIC (REFLEX): RBC / HPF: NONE SEEN RBC/hpf (ref 0–5)

## 2018-07-17 LAB — LIPASE, BLOOD: LIPASE: 38 U/L (ref 11–51)

## 2018-07-17 MED ORDER — SODIUM CHLORIDE 0.9 % IV BOLUS: 500.0000 mL | Freq: Once | INTRAVENOUS | Status: DC

## 2018-07-17 MED ORDER — ONDANSETRON HCL 4 MG/2ML IJ SOLN
4.0000 mg | Freq: Once | INTRAMUSCULAR | Status: AC
  Administered 2018-07-17: 4 mg via INTRAVENOUS
  Filled 2018-07-17: qty 2

## 2018-07-17 MED ORDER — SODIUM CHLORIDE 0.9 % IV BOLUS
1000.0000 mL | Freq: Once | INTRAVENOUS | Status: AC
  Administered 2018-07-17: 1000 mL via INTRAVENOUS

## 2018-07-17 NOTE — ED Triage Notes (Signed)
Constipation for almost 2 weeks.

## 2018-07-17 NOTE — ED Provider Notes (Signed)
[facility] EMERGENCY DEPARTMENT Provider Note   CSN: [id number] Arrival date & time: 07/17/18  1751    History   Chief Complaint Chief Complaint  Patient presents with  . Abdominal Pain  . Constipation    HPI Barbara [name] is a 58 y.o. female with a past medical history of sickle cell anemia, GERD, who presents today for evaluation of abdominal pain and distention.  She reports that for the past 14 days she has felt constipated.  Initially she was able to drink black coffee which provided a bowel movement of a few small pellets, however for the past 10 days she has not had any bowel movements.  She does report that she has had a few times when liquid stool was able to come out however she has not had any significant bowel movements.  She has not tried any laxatives or treatments at home other than black coffee.  She does report that she attempted to manually disimpact herself however did not get large amount of stool out.  She says that her abdomen has been getting more and more distended and generally painful.  Her last colonoscopy was within the past 10 years and was reportedly normal.  She reports that she feels like she is unable to fully empty her bladder due to her pelvic pressure which she attributes to her constipation.       HPI  Past Medical History:  Diagnosis Date  . GERD (gastroesophageal reflux disease)   . Low iron   . Sickle cell anemia [facility](HCC)     Patient Active Problem List   Diagnosis Date Noted  . Sickle cell disease (HCC) 10/27/2017  . Strain of trapezius muscle 07/21/2017  . Shoulder strain 05/29/2017  . GERD (gastroesophageal reflux disease) 08/27/2014  . Chest pain 08/26/2014    Past Surgical History:  Procedure Laterality Date  . ANTERIOR CRUCIATE LIGAMENT REPAIR    . CHOLECYSTECTOMY    . ENDOMETRIAL ABLATION    . LEFT HEART CATHETERIZATION WITH CORONARY ANGIOGRAM N/A 08/27/2014   Procedure: LEFT HEART CATHETERIZATION WITH CORONARY  ANGIOGRAM;  Surgeon: Iran OuchMuhammad A Arida, MD;  Location: [facility] CATH LAB;  Service: Cardiovascular;  Laterality: N/A;     OB History   No obstetric history on file.      Home Medications    Prior to Admission medications   Medication Sig Start Date End Date Taking? Authorizing Provider  meloxicam (MOBIC) 15 MG tablet Take 1 tablet by mouth daily. 09/19/17  Yes [provider]  calcium carbonate (TUMS - DOSED IN MG ELEMENTAL CALCIUM) 500 MG chewable tablet Chew 1 tablet by mouth daily as needed for indigestion or heartburn.    [provider]  HYDROcodone-acetaminophen (NORCO/VICODIN) 5-325 MG tablet Take 1 tablet by mouth every 6 (six) hours as needed. 01/27/18   Charlynne PanderYao, David Hsienta, MD  lidocaine (LIDODERM) 5 % Place 1 patch onto the skin daily. Remove & Discard patch within 12 hours or as directed by MD Patient taking differently: Place 1 patch onto the skin as needed. Remove & Discard patch within 12 hours or as directed by MD 05/05/17   Caccavale, Sophia, PA-C  loratadine (CLARITIN) 10 MG tablet Take 10 mg by mouth as needed.     [provider]  omeprazole (PRILOSEC) 20 MG capsule Take 20 mg by mouth as needed.     [provider]  ondansetron (ZOFRAN ODT) 4 MG disintegrating tablet Take 1 tablet (4 mg total) by mouth every 8 (eight)  hours as needed for nausea or vomiting. 07/18/18   Cristina Gong, PA-C  sucralfate (CARAFATE) 1 GM/10ML suspension Take 10 mLs (1 g total) by mouth 4 (four) times daily -  with meals and at bedtime. 08/15/16   Palumbo, April, MD    Family History Family History  Problem Relation Age of Onset  . CAD Brother 50       CABG at age 14, and deceased  . CAD Sister 50       1 stent  . CAD Father 36       Deceased in 40s    Social History Social History   Tobacco Use  . Smoking status: Former Smoker    Years: 1.00    Types: Cigarettes    Last attempt to quit: 08/26/2013    Years since quitting: 4.8  . Smokeless tobacco:  Never Used  Substance Use Topics  . Alcohol use: Yes    Comment: occasionally, glass of wine  . Drug use: No     Allergies   Iodine; Morphine and related; Other; Sulfa antibiotics; and Sulfamethoxazole-trimethoprim   Review of Systems Review of Systems  Constitutional: Negative for chills and fever.  HENT: Negative for congestion.   Respiratory: Negative for chest tightness and shortness of breath.   Cardiovascular: Negative for chest pain.  Gastrointestinal: Positive for abdominal distention, abdominal pain, constipation, diarrhea, nausea and vomiting. Negative for blood in stool.  Genitourinary: Positive for difficulty urinating. Negative for dysuria, flank pain, frequency and urgency.  Musculoskeletal: Negative for back pain.  Neurological: Negative for weakness.     Physical Exam Updated Vital Signs BP (!) 145/82 (BP Location: Right Arm)   Pulse 78   Temp 98.9 F (37.2 C) (Oral)   Resp 18   Ht  (1.753 m)   Wt 73.5 kg   SpO2 100%   BMI 23.92 kg/m   Physical Exam Vitals signs and nursing note reviewed.  Constitutional:      General: She is not in acute distress.    Appearance: She is well-developed.  HENT:     Head: Normocephalic and atraumatic.     Mouth/Throat:     Mouth: Mucous membranes are moist.  Eyes:     Conjunctiva/sclera: Conjunctivae normal.  Neck:     Musculoskeletal: Neck supple.  Cardiovascular:     Rate and Rhythm: Normal rate and regular rhythm.     Heart sounds: No murmur.  Pulmonary:     Effort: Pulmonary effort is normal. No respiratory distress.     Breath sounds: Normal breath sounds.  Abdominal:     General: Bowel sounds are decreased. There is distension.     Palpations: Abdomen is soft.     Tenderness: There is generalized abdominal tenderness. There is no guarding or rebound.     Hernia: No hernia is present.  Skin:    General: Skin is warm and dry.  Neurological:     General: No focal deficit present.     Mental  Status: She is alert.     Cranial Nerves: No cranial nerve deficit.     Motor: No weakness.  Psychiatric:        Mood and Affect: Mood normal.        Behavior: Behavior normal.      ED Treatments / Results  Labs (all labs ordered are listed, but only abnormal results are displayed) Labs Reviewed  COMPREHENSIVE METABOLIC PANEL - Abnormal; Notable for the following components:  Result Value   Glucose, Bld 107 (*)    Calcium 8.8 (*)    All other components within normal limits  CBC WITH DIFFERENTIAL/PLATELET - Abnormal; Notable for the following components:   Hemoglobin 11.9 (*)    HCT 35.7 (*)    All other components within normal limits  URINALYSIS, ROUTINE W REFLEX MICROSCOPIC - Abnormal; Notable for the following components:   APPearance HAZY (*)    Ketones, ur 15 (*)    Leukocytes,Ua TRACE (*)    All other components within normal limits  URINALYSIS, MICROSCOPIC (REFLEX) - Abnormal; Notable for the following components:   Bacteria, UA FEW (*)    All other components within normal limits  LIPASE, BLOOD    EKG None  Radiology Ct Abdomen Pelvis Wo Contrast  Result Date: 07/17/2018 CLINICAL DATA:  Abdominal pain and distention. Unable to pass stool for 10 days. Nausea and vomiting. EXAM: CT ABDOMEN AND PELVIS WITHOUT CONTRAST TECHNIQUE: Multidetector CT imaging of the abdomen and pelvis was performed following the standard protocol without IV contrast. COMPARISON:  05/06/2017 FINDINGS: Lower chest: Lung bases are clear. Hepatobiliary: No focal liver abnormality is seen. Status post cholecystectomy. No biliary dilatation. Pancreas: Unremarkable. No pancreatic ductal dilatation or surrounding inflammatory changes. Spleen: Normal in size without focal abnormality. Adrenals/Urinary Tract: Adrenal glands are unremarkable. Kidneys are normal, without renal calculi, focal lesion, or hydronephrosis. Bladder is unremarkable. Stomach/Bowel: Stool fills the colon. No small or large  bowel distention. No wall thickening or inflammatory changes. Appendix is normal. Stomach is unremarkable. Vascular/Lymphatic: No significant vascular findings are present. No enlarged abdominal or pelvic lymph nodes. Reproductive: Uterus and bilateral adnexa are unremarkable. Other: No abdominal wall hernia or abnormality. No abdominopelvic ascites. Musculoskeletal: No acute or significant osseous findings. IMPRESSION: No acute process demonstrated in the abdomen or pelvis. Diffusely stool-filled colon. No evidence of bowel obstruction or inflammation. Appendix is normal. Electronically Signed   By: Burman Nieves M.D.   On: 07/17/2018 21:11    Procedures Procedures (including critical care time)  Medications Ordered in ED Medications  ondansetron [facility]) injection 4 mg (4 mg Intravenous Given 07/17/18 2007)  sodium chloride 0.9 % bolus 1,000 mL ( Intravenous Stopped 07/17/18 2329)     Initial Impression / Assessment and Plan / ED Course  I have reviewed the triage vital signs and the nursing notes.  Pertinent labs & imaging results that were available during my care of the patient were reviewed by me and considered in my medical decision making (see chart for details).  Clinical Course as of Jul 18 17  Tue Jul 17, 2018  2215 Patient reevaluated, we discussed her results.  She is unable to provide a urine sample.  She wishes for her urine to be checked.  She does feel like she is dehydrated and is requesting IV fluids.     [EH]    Clinical Course User Index [EH] Cristina Gong, PA-C      Patient presents today for evaluation of abdominal pain and distention with no bowel movement for approximately 10 days.  On exam her abdomen was generally tender to palpation and distended.  Discussed with patient conservative care including MiraLAX and stool softeners versus additional evaluation and she elected for additional evaluation.  Labs were obtained and reviewed, CBC shows mild anemia  with a hemoglobin of 11.9, however she is without other significant electrolyte or hematologic derangements.  Lipase is not elevated.  Urine was obtained, microscopy showing 6-10 squamous epithelial cells with  few bacteria and no RBC, given that there appeared to be more squamous epithelial cells than bacteria cells I suspect that this represents contamination.  CT scan abdomen pelvis was obtained showing constipation without other acute abnormalities.  Recommended MiraLAX, and Fleet enemas as needed.  Patient is given a prescription for Zofran to help with nausea.  I recommended that she take MiraLAX daily after this acute episode resolves.  She is aware that it may take 1 to 3 days for MiraLAX to cause her to have a bowel movement.  Return precautions were discussed with patient who states their understanding.  At the time of discharge patient denied any unaddressed complaints or concerns.  Patient is agreeable for discharge home.    Final Clinical Impressions(s) / ED Diagnoses   Final diagnoses:  Constipation, unspecified constipation type    ED Discharge Orders         Ordered    ondansetron (ZOFRAN ODT) 4 MG disintegrating tablet  Every 8 hours PRN     07/18/18 0012           Cristina Gong, PA-C 07/18/18 0022    Little, Ambrose Finland, MD 07/20/18 [phone]

## 2018-07-17 NOTE — ED Notes (Signed)
Pt unable to void at this time. Bladder scan performed

## 2018-07-18 MED ORDER — ONDANSETRON 4 MG PO TBDP: 4.0000 mg | ORAL_TABLET | Freq: Three times a day (TID) | ORAL | 0 refills | Status: DC | PRN

## 2018-07-18 NOTE — Discharge Instructions (Addendum)
To treat your constipation I recommend using MiraLAX and Fleet enemas as needed.  For MiraLAX recommend starting with 2-3 doses twice a day.  If this does not provide adequate relief you can increase to 3-4 doses twice a day.  You can also use Fleet enemas to help with your rectal pressure.  I would recommend that you take 1 dose of MiraLAX daily for the foreseeable future.  Please make sure you are eating plenty of high-fiber foods.  Your blood pressure was slightly elevated while you are in the emergency room.  This can be due to being in the emergency room, and pain, and worrying.  I would recommend that you get this rechecked in the next week as if it remains high you may need medications.

## 2019-07-25 DIAGNOSIS — D539 Nutritional anemia, unspecified: Secondary | ICD-10-CM | POA: Diagnosis not present

## 2019-07-25 DIAGNOSIS — Z23 Encounter for immunization: Secondary | ICD-10-CM | POA: Diagnosis not present

## 2019-07-25 DIAGNOSIS — Z20822 Contact with and (suspected) exposure to covid-19: Secondary | ICD-10-CM | POA: Diagnosis not present

## 2019-07-25 DIAGNOSIS — R0602 Shortness of breath: Secondary | ICD-10-CM | POA: Diagnosis not present

## 2019-07-25 DIAGNOSIS — E559 Vitamin D deficiency, unspecified: Secondary | ICD-10-CM | POA: Diagnosis not present

## 2019-07-25 DIAGNOSIS — Z131 Encounter for screening for diabetes mellitus: Secondary | ICD-10-CM | POA: Diagnosis not present

## 2019-07-25 DIAGNOSIS — R5383 Other fatigue: Secondary | ICD-10-CM | POA: Diagnosis not present

## 2019-07-25 DIAGNOSIS — Z0000 Encounter for general adult medical examination without abnormal findings: Secondary | ICD-10-CM | POA: Diagnosis not present

## 2019-07-25 DIAGNOSIS — Z13220 Encounter for screening for lipoid disorders: Secondary | ICD-10-CM | POA: Diagnosis not present

## 2019-08-14 DIAGNOSIS — D539 Nutritional anemia, unspecified: Secondary | ICD-10-CM | POA: Diagnosis not present

## 2019-08-14 DIAGNOSIS — E559 Vitamin D deficiency, unspecified: Secondary | ICD-10-CM | POA: Diagnosis not present

## 2019-08-14 DIAGNOSIS — Z780 Asymptomatic menopausal state: Secondary | ICD-10-CM | POA: Diagnosis not present

## 2019-08-14 DIAGNOSIS — F17210 Nicotine dependence, cigarettes, uncomplicated: Secondary | ICD-10-CM | POA: Diagnosis not present

## 2019-08-14 DIAGNOSIS — E785 Hyperlipidemia, unspecified: Secondary | ICD-10-CM | POA: Diagnosis not present

## 2019-08-15 ENCOUNTER — Ambulatory Visit: Payer: BC Managed Care – PPO | Attending: Internal Medicine

## 2019-08-15 DIAGNOSIS — Z01818 Encounter for other preprocedural examination: Secondary | ICD-10-CM | POA: Diagnosis not present

## 2019-08-15 DIAGNOSIS — K5909 Other constipation: Secondary | ICD-10-CM | POA: Diagnosis not present

## 2019-08-15 DIAGNOSIS — Z86010 Personal history of colonic polyps: Secondary | ICD-10-CM | POA: Diagnosis not present

## 2019-08-15 DIAGNOSIS — R1013 Epigastric pain: Secondary | ICD-10-CM | POA: Diagnosis not present

## 2019-08-15 DIAGNOSIS — Z23 Encounter for immunization: Secondary | ICD-10-CM

## 2019-08-15 NOTE — Progress Notes (Signed)
   Covid-19 Vaccination Clinic  Name:  Barbara [name]    MRN: [id number] DOB: 01/14/1961  08/15/2019  [name] was observed post Covid-19 immunization for 30 minutes based on pre-vaccination screening without incident. She was provided with Vaccine Information Sheet and instruction to access the V-Safe system.   [name] was instructed to call 911 with any severe reactions post vaccine: Marland Kitchen Difficulty breathing  . Swelling of face and throat  . A fast heartbeat  . A bad rash all over body  . Dizziness and weakness   Immunizations Administered    Name Date Dose VIS Date Route   Pfizer COVID-19 Vaccine 08/15/2019 12:12 PM 0.3 mL 05/03/2019 Intramuscular   Manufacturer: [employer], [employer]   Lot: [id number]   NDC: [address]-5

## 2019-08-19 DIAGNOSIS — Z86010 Personal history of colonic polyps: Secondary | ICD-10-CM | POA: Diagnosis not present

## 2019-08-19 DIAGNOSIS — Z1211 Encounter for screening for malignant neoplasm of colon: Secondary | ICD-10-CM | POA: Diagnosis not present

## 2019-08-22 DIAGNOSIS — R1084 Generalized abdominal pain: Secondary | ICD-10-CM | POA: Diagnosis not present

## 2019-08-29 DIAGNOSIS — R1084 Generalized abdominal pain: Secondary | ICD-10-CM | POA: Diagnosis not present

## 2019-09-09 ENCOUNTER — Ambulatory Visit: Payer: BC Managed Care – PPO | Attending: Internal Medicine

## 2019-09-09 DIAGNOSIS — Z23 Encounter for immunization: Secondary | ICD-10-CM

## 2019-09-09 NOTE — Progress Notes (Signed)
   Covid-19 Vaccination Clinic  Name:  Trinika Cortese    MRN: [id number] DOB: 1960/11/04  09/09/2019  Ms. Sadlon was observed post Covid-19 immunization for 30 minutes based on pre-vaccination screening without incident. She was provided with Vaccine Information Sheet and instruction to access the V-Safe system.   Ms. Faciane was instructed to call 911 with any severe reactions post vaccine: Marland Kitchen Difficulty breathing  . Swelling of face and throat  . A fast heartbeat  . A bad rash all over body  . Dizziness and weakness   Immunizations Administered    Name Date Dose VIS Date Route   Pfizer COVID-19 Vaccine 09/09/2019 11:22 AM 0.3 mL 07/17/2018 Intramuscular   Manufacturer: [employer], [employer]   Lot: [id number]   NDC: [address]-3

## 2019-09-13 DIAGNOSIS — Z1211 Encounter for screening for malignant neoplasm of colon: Secondary | ICD-10-CM | POA: Diagnosis not present

## 2019-09-13 DIAGNOSIS — R1310 Dysphagia, unspecified: Secondary | ICD-10-CM | POA: Diagnosis not present

## 2019-09-13 DIAGNOSIS — R1013 Epigastric pain: Secondary | ICD-10-CM | POA: Diagnosis not present

## 2019-09-20 DIAGNOSIS — K2970 Gastritis, unspecified, without bleeding: Secondary | ICD-10-CM | POA: Diagnosis not present

## 2019-09-20 DIAGNOSIS — K900 Celiac disease: Secondary | ICD-10-CM | POA: Diagnosis not present

## 2019-09-20 DIAGNOSIS — A048 Other specified bacterial intestinal infections: Secondary | ICD-10-CM | POA: Diagnosis not present

## 2019-09-20 DIAGNOSIS — K2270 Barrett's esophagus without dysplasia: Secondary | ICD-10-CM | POA: Diagnosis not present

## 2019-09-23 DIAGNOSIS — K200 Eosinophilic esophagitis: Secondary | ICD-10-CM | POA: Diagnosis not present

## 2019-09-24 DIAGNOSIS — J309 Allergic rhinitis, unspecified: Secondary | ICD-10-CM | POA: Diagnosis not present

## 2019-10-10 DIAGNOSIS — R1310 Dysphagia, unspecified: Secondary | ICD-10-CM | POA: Diagnosis not present

## 2019-10-10 DIAGNOSIS — K2960 Other gastritis without bleeding: Secondary | ICD-10-CM | POA: Diagnosis not present

## 2019-10-10 DIAGNOSIS — K5909 Other constipation: Secondary | ICD-10-CM | POA: Diagnosis not present

## 2019-10-10 DIAGNOSIS — R1013 Epigastric pain: Secondary | ICD-10-CM | POA: Diagnosis not present

## 2020-05-05 DIAGNOSIS — N938 Other specified abnormal uterine and vaginal bleeding: Secondary | ICD-10-CM | POA: Diagnosis not present

## 2021-10-18 ENCOUNTER — Emergency Department (HOSPITAL_COMMUNITY): Payer: 59

## 2021-10-18 ENCOUNTER — Emergency Department (HOSPITAL_COMMUNITY)
Admission: EM | Admit: 2021-10-18 | Discharge: 2021-10-18 | Disposition: A | Discharge status: Home / Self Care | Payer: 59 | Attending: Emergency Medicine | Admitting: Emergency Medicine

## 2021-10-18 ENCOUNTER — Encounter (HOSPITAL_COMMUNITY): Payer: Self-pay

## 2021-10-18 DIAGNOSIS — K625 Hemorrhage of anus and rectum: Secondary | ICD-10-CM | POA: Diagnosis not present

## 2021-10-18 DIAGNOSIS — R1013 Epigastric pain: Secondary | ICD-10-CM | POA: Insufficient documentation

## 2021-10-18 DIAGNOSIS — M25512 Pain in left shoulder: Secondary | ICD-10-CM | POA: Insufficient documentation

## 2021-10-18 DIAGNOSIS — R110 Nausea: Secondary | ICD-10-CM | POA: Diagnosis not present

## 2021-10-18 LAB — CBC
HCT: 33.7 % — ABNORMAL LOW (ref 36.0–46.0)
Hemoglobin: 12 g/dL (ref 12.0–15.0)
MCH: 28.8 pg (ref 26.0–34.0)
MCHC: 35.6 g/dL (ref 30.0–36.0)
MCV: 81 fL (ref 80.0–100.0)
Platelets: 272 10*3/uL (ref 150–400)
RBC: 4.16 MIL/uL (ref 3.87–5.11)
RDW: 14.1 % (ref 11.5–15.5)
WBC: 7.2 10*3/uL (ref 4.0–10.5)
nRBC: 0 % (ref 0.0–0.2)

## 2021-10-18 LAB — COMPREHENSIVE METABOLIC PANEL
ALT: 22 U/L (ref 0–44)
AST: 20 U/L (ref 15–41)
Albumin: 4.3 g/dL (ref 3.5–5.0)
Alkaline Phosphatase: 101 U/L (ref 38–126)
Anion gap: 7 (ref 5–15)
BUN: 15 mg/dL (ref 6–20)
CO2: 24 mmol/L (ref 22–32)
Calcium: 9.1 mg/dL (ref 8.9–10.3)
Chloride: 108 mmol/L (ref 98–111)
Creatinine, Ser: 0.92 mg/dL (ref 0.44–1.00)
GFR, Estimated: >60 mL/min (ref >60)
Glucose, Bld: 95 mg/dL (ref 70–99)
Potassium: 4.2 mmol/L (ref 3.5–5.1)
Sodium: 139 mmol/L (ref 135–145)
Total Bilirubin: 0.5 mg/dL (ref 0.3–1.2)
Total Protein: 7.9 g/dL (ref 6.5–8.1)

## 2021-10-18 LAB — URINALYSIS, ROUTINE W REFLEX MICROSCOPIC
Bilirubin Urine: NEGATIVE
Glucose, UA: NEGATIVE mg/dL
Hgb urine dipstick: NEGATIVE
Ketones, ur: 5 mg/dL — AB
Leukocytes,Ua: NEGATIVE
Nitrite: NEGATIVE
Protein, ur: NEGATIVE mg/dL
Specific Gravity, Urine: 1.025 (ref 1.005–1.030)
pH: 5 (ref 5.0–8.0)

## 2021-10-18 LAB — I-STAT BETA HCG BLOOD, ED (MC, WL, AP ONLY): I-stat hCG, quantitative: <5 m[IU]/mL (ref <5)

## 2021-10-18 LAB — LIPASE, BLOOD: Lipase: 33 U/L (ref 11–51)

## 2021-10-18 MED ORDER — SODIUM CHLORIDE 0.9 % IV BOLUS
1000.0000 mL | Freq: Once | INTRAVENOUS | Status: AC
Start: 2021-10-18 — End: 2021-10-18
  Administered 2021-10-18: 1000 mL via INTRAVENOUS

## 2021-10-18 MED ORDER — FENTANYL CITRATE PF 50 MCG/ML IJ SOSY
50.0000 ug | PREFILLED_SYRINGE | Freq: Once | INTRAMUSCULAR | Status: AC
  Administered 2021-10-18: 50 ug via INTRAVENOUS
  Filled 2021-10-18: qty 1

## 2021-10-18 MED ORDER — PANTOPRAZOLE SODIUM 40 MG IV SOLR
40.0000 mg | Freq: Once | INTRAVENOUS | Status: AC
  Administered 2021-10-18: 40 mg via INTRAVENOUS
  Filled 2021-10-18: qty 10

## 2021-10-18 MED ORDER — ONDANSETRON HCL 4 MG/2ML IJ SOLN
4.0000 mg | Freq: Once | INTRAMUSCULAR | Status: AC
Start: 2021-10-18 — End: 2021-10-18
  Administered 2021-10-18: 4 mg via INTRAVENOUS
  Filled 2021-10-18: qty 2

## 2021-10-18 NOTE — ED Triage Notes (Signed)
C/o large amounts of blood in stool described at clots that started yesterday. Denies hemorrhoids.  C/o nausea   Pt reports generalized abdominal pain that radiates to her back that started yesterday. Reports abdomen is cramping and diarrhea.

## 2021-10-18 NOTE — Discharge Instructions (Signed)
You were seen in the emergency department for upper abdominal pain and rectal bleeding.  Your blood counts were stable and your CAT scan did not show any obvious abnormalities.  Dr. Hinda Lenis GI recommended you call the office tomorrow for close follow-up.  Please continue your acid medication and avoid taking any anti-inflammatories such as Aleve or Advil.  Return to the emergency department if any worsening symptoms.

## 2021-10-18 NOTE — ED Provider Notes (Signed)
[facility]-EMERGENCY DEPT Provider Note   CSN: [id number] Arrival date & time: 10/18/21  1435     History {Add pertinent medical, surgical, social history, OB history to HPI:1} Chief Complaint  Patient presents with   Abdominal Pain    Barbara [name] is a 61 y.o. female.  She is here with a complaint of 2 episodes of bright red blood per rectum starting yesterday.  Associated with crampy upper and right-sided abdominal pain and nausea.  She said it started acutely yesterday and she had explosive red blood per rectum.  Today had another loose bowel movement with a little bit of stool and darker red with clots.  She has never had lower GI bleeding before but does have a history of peptic ulcers and has thrown up blood in the past.  Last time was about 2 years ago and she followed with endoscopy and colonoscopy at [facility].  No chest pain or shortness of breath.  She does have some left shoulder discomfort.  She has tried nothing for her symptoms.  She does take an Aleve every evening for arthritis pain, infrequent alcohol, no tobacco.  The history is provided by the patient.  Abdominal Pain Pain location:  Epigastric and RUQ Pain quality: cramping   Pain radiates to:  Does not radiate Pain severity:  Moderate Onset quality:  Sudden Duration:  2 days Timing:  Intermittent Progression:  Unchanged Chronicity:  New Context: not trauma   Relieved by:  None tried Worsened by:  Nothing Ineffective treatments:  None tried Associated symptoms: hematochezia and nausea   Associated symptoms: no chest pain, no cough, no dysuria, no fever, no hematemesis, no shortness of breath, no sore throat and no vomiting   Risk factors: NSAID use       Home Medications Prior to Admission medications   Medication Sig Start Date End Date Taking? Authorizing Provider  calcium carbonate (TUMS - DOSED IN MG ELEMENTAL CALCIUM) 500 MG chewable tablet Chew 1 tablet by mouth daily as needed  for indigestion or heartburn.    [provider]  HYDROcodone-acetaminophen (NORCO/VICODIN) 5-325 MG tablet Take 1 tablet by mouth every 6 (six) hours as needed. 01/27/18   Charlynne Pander, MD  lidocaine (LIDODERM) 5 % Place 1 patch onto the skin daily. Remove & Discard patch within 12 hours or as directed by MD Patient taking differently: Place 1 patch onto the skin as needed. Remove & Discard patch within 12 hours or as directed by MD 05/05/17   Caccavale, Sophia, PA-C  loratadine (CLARITIN) 10 MG tablet Take 10 mg by mouth as needed.     [provider]  meloxicam (MOBIC) 15 MG tablet Take 1 tablet by mouth daily. 09/19/17   [provider]  omeprazole (PRILOSEC) 20 MG capsule Take 20 mg by mouth as needed.     [provider]  ondansetron (ZOFRAN ODT) 4 MG disintegrating tablet Take 1 tablet (4 mg total) by mouth every 8 (eight) hours as needed for nausea or vomiting. 07/18/18   Cristina Gong, PA-C  sucralfate (CARAFATE) 1 GM/10ML suspension Take 10 mLs (1 g total) by mouth 4 (four) times daily -  with meals and at bedtime. 08/15/16   Palumbo, April, MD      Allergies    Iodine, Morphine and related, Other, Sulfa antibiotics, and Sulfamethoxazole-trimethoprim    Review of Systems   Review of Systems  Constitutional:  Negative for fever.  HENT:  Negative for sore throat.  Eyes:  Negative for visual disturbance.  Respiratory:  Negative for cough and shortness of breath.   Cardiovascular:  Negative for chest pain.  Gastrointestinal:  Positive for abdominal pain, blood in stool, hematochezia and nausea. Negative for hematemesis, rectal pain and vomiting.  Genitourinary:  Negative for dysuria.  Musculoskeletal:  Negative for back pain.  Skin:  Negative for rash.  Neurological:  Positive for headaches.   Physical Exam Updated Vital Signs BP (!) 166/96   Pulse 95   Temp 98.7 F (37.1 C) (Oral)   Resp 18   Ht 5\' 9"  (1.753 m)   Wt 78.2 kg   LMP  09/29/2021 (Exact Date)   SpO2 96%   BMI 25.44 kg/m  Physical Exam Vitals and nursing note reviewed.  Constitutional:      General: She is not in acute distress.    Appearance: Normal appearance. She is well-developed.  HENT:     Head: Normocephalic and atraumatic.  Eyes:     Conjunctiva/sclera: Conjunctivae normal.  Cardiovascular:     Rate and Rhythm: Normal rate and regular rhythm.     Heart sounds: No murmur heard. Pulmonary:     Effort: Pulmonary effort is normal. No respiratory distress.     Breath sounds: Normal breath sounds.  Abdominal:     Palpations: Abdomen is soft.     Tenderness: There is abdominal tenderness in the right upper quadrant and epigastric area. There is no guarding or rebound.  Musculoskeletal:        General: No swelling.     Cervical back: Neck supple.  Skin:    General: Skin is warm and dry.     Capillary Refill: Capillary refill takes less than 2 seconds.  Neurological:     General: No focal deficit present.     Mental Status: She is alert.    ED Results / Procedures / Treatments   Labs (all labs ordered are listed, but only abnormal results are displayed) Labs Reviewed  CBC - Abnormal; Notable for the following components:      Result Value   HCT 33.7 (*)    All other components within normal limits  URINALYSIS, ROUTINE W REFLEX MICROSCOPIC - Abnormal; Notable for the following components:   Ketones, ur 5 (*)    All other components within normal limits  LIPASE, BLOOD  COMPREHENSIVE METABOLIC PANEL  I-STAT BETA HCG BLOOD, ED ([facility], [facility], [facility] ONLY)    EKG None  Radiology No results found.  Procedures Procedures  {Document cardiac monitor, telemetry assessment procedure when appropriate:1}  Medications Ordered in ED Medications  sodium chloride 0.9 % bolus 1,000 mL (has no administration in time range)  fentaNYL (SUBLIMAZE) injection 50 mcg (has no administration in time range)  ondansetron (ZOFRAN) injection 4 mg (has no  administration in time range)  pantoprazole (PROTONIX) injection 40 mg (has no administration in time range)    ED Course/ Medical Decision Making/ A&P                           Medical Decision Making Amount and/or Complexity of Data Reviewed Labs: ordered.  Risk Prescription drug management.  This patient complains of ***; this involves an extensive number of treatment Options and is a complaint that carries with it a high risk of complications and morbidity. The differential includes ***  I ordered, reviewed and interpreted labs, which included *** I ordered medication *** and reviewed PMP when indicated. I ordered imaging  studies which included *** and I independently    visualized and interpreted imaging which showed *** Additional history obtained from *** Previous records obtained and reviewed *** I consulted *** and discussed lab and imaging findings and discussed disposition.  Cardiac monitoring reviewed, *** Social determinants considered, *** Critical Interventions: ***  After the interventions stated above, I reevaluated the patient and found *** Admission and further testing considered, ***    {Document critical care time when appropriate:1} {Document review of labs and clinical decision tools ie heart score, Chads2Vasc2 etc:1}  {Document your independent review of radiology images, and any outside records:1} {Document your discussion with family members, caretakers, and with consultants:1} {Document social determinants of health affecting pt's care:1} {Document your decision making why or why not admission, treatments were needed:1} Final Clinical Impression(s) / ED Diagnoses Final diagnoses:  None    Rx / DC Orders ED Discharge Orders     None

## 2021-10-19 LAB — POC OCCULT BLOOD, ED: Fecal Occult Bld: POSITIVE — AB

## 2021-11-25 ENCOUNTER — Ambulatory Visit (INDEPENDENT_AMBULATORY_CARE_PROVIDER_SITE_OTHER): Payer: 59 | Admitting: Family Medicine

## 2021-11-25 ENCOUNTER — Encounter: Payer: Self-pay | Admitting: Family Medicine

## 2021-11-25 VITALS — BP 138/84 | HR 78 | Temp 98.5°F | Ht 66.5 in | Wt 169.6 lb

## 2021-11-25 DIAGNOSIS — K921 Melena: Secondary | ICD-10-CM | POA: Diagnosis not present

## 2021-11-25 DIAGNOSIS — K219 Gastro-esophageal reflux disease without esophagitis: Secondary | ICD-10-CM | POA: Diagnosis not present

## 2021-11-25 DIAGNOSIS — D5700 Hb-SS disease with crisis, unspecified: Secondary | ICD-10-CM

## 2021-11-25 DIAGNOSIS — N939 Abnormal uterine and vaginal bleeding, unspecified: Secondary | ICD-10-CM | POA: Diagnosis not present

## 2021-11-25 DIAGNOSIS — D649 Anemia, unspecified: Secondary | ICD-10-CM

## 2021-11-25 NOTE — Progress Notes (Signed)
Barbara [name] is a 61 y.o. female who presents today for an office visit.  Assessment/Plan:   Spent 49 minutes reviewing patient's chart and discussing ongoing care management. At today's visit, we discussed treatment options, associated risk and benefits, and engage in counseling as needed.  Additionally the following were reviewed: Past medical records, past medical and surgical history, family and social background, as well as relevant laboratory results, imaging findings, and specialty notes, where applicable.  This message was generated using dictation software, and as a result, it may contain unintentional typos or errors.  Nevertheless, extensive effort was made to accurately convey at the pertinent aspects of the patient visit.    There may have been are other unrelated non-urgent complaints, but due to the busy schedule and the amount of time already spent with her, time does not permit to address these issues at today's visit. Another appointment may have or has been requested to review these additional issues.  Problem List Items Addressed This Visit       Digestive   GERD (gastroesophageal reflux disease)    With chronic abdominal pain And GI bleeding Takes Protonix 40 mg Encourage patient follow-up with GI Patient follow-up after that as needed      Relevant Medications   pantoprazole (PROTONIX) 40 MG tablet     Genitourinary   Abnormal uterine bleeding (AUB)    Follow-up with OB/GYN Symptoms resolved for now.        Other   Sickle cell disease (HCC)    Currently stable, no signs of crisis Patient follow-up after seeing GI, If anemia persist, can consider referral to hematology      Other Visit Diagnoses     Melena    -  Primary   Hematochezia       Anemia, unspecified type              Subjective:  HPI:  Barbara [name] is a 61 y.o. female who has GERD (gastroesophageal reflux disease); Sickle cell disease (HCC); and Abnormal uterine bleeding  (AUB) on their problem list..   She  has a past medical history of GERD (gastroesophageal reflux disease), Low iron, and Sickle cell anemia (HCC)..   She presents with chief complaint of Establish Care (Referral to GI for endoscopy. Patient states that she has been experiencing blood in stool, heart burn , gas and stomach cramping. Currently has upper abdominal pain that radiates to her back. Patient would like referral for mammogram. ) .   Patiently recently evaluated in the Emergency Department 10/2021 for GI bleeding hemoglobin was normal at 12, but stool was guaiac positive.  CMP normal creatinine.  Patient with history of chronic abdominal pain and GERD.  She is followed with GI in the past, she did have an endoscopy scheduled this past week, however it was postponed due to patient having tachycardia at home.  This has resolved.  Patient is no longer having blood in her stool.  She has no abdominal pain at this moment.  She does not have any chest pain or shortness of breath.  Patient has GERD.  Symptoms are improved with Protonix 40 mg daily.  Patient also with history of abnormal uterine bleeding.  She was evaluated by OB/GYN in 06/2021.  Status post D&C and hysteroscopy.  Pathology showed endometriosis, without signs of malignancy.  Her abnormal uterine bleeding has resolved.  Patient with history of sickle cell disease.  Last crisis was 7 months ago77.  Has not followed with hematology  in several years.  Manages most pain at home with hydration and Tylenol, she has stopped taking ibuprofen due to her GI bleeding.  She has gotten iron transfusions in the past because she did not tolerate oral iron.       Objective:  Physical Exam: BP 138/84 (BP Location: Left Arm, Patient Position: Sitting, Cuff Size: Large)   Pulse 78   Temp 98.5 F (36.9 C) (Temporal)   Ht 5' 6.5" (1.689 m)   Wt 169 lb 9.6 oz (76.9 kg)   LMP 09/29/2021   SpO2 98%   BMI 26.96 kg/m    General: No acute distress.  Awake and conversant.  Eyes: Normal conjunctiva, anicteric. Round symmetric pupils.  ENT: Hearing grossly intact. No nasal discharge.  Neck: Neck is supple. No masses or thyromegaly.  Respiratory: Respirations are non-labored. No auditory wheezing.  Skin: Warm. No rashes or ulcers.  Psych: Alert and oriented. Cooperative, Appropriate mood and affect, Normal judgment.  CV: No cyanosis or JVD, normal S1-S2, no MMM MSK: Normal ambulation. No clubbing  ABD: Nontender, nondistended Neuro: Sensation and CN II-XII grossly normal.        Garner Nash, MD, MS

## 2021-11-25 NOTE — Assessment & Plan Note (Signed)
Follow-up with OB/GYN Symptoms resolved for now.

## 2021-11-25 NOTE — Assessment & Plan Note (Signed)
With chronic abdominal pain And GI bleeding Takes Protonix 40 mg Encourage patient follow-up with GI Patient follow-up after that as needed

## 2021-11-25 NOTE — Assessment & Plan Note (Signed)
Currently stable, no signs of crisis Patient follow-up after seeing GI, If anemia persist, can consider referral to hematology

## 2021-12-13 ENCOUNTER — Telehealth: Payer: Self-pay | Admitting: Family Medicine

## 2021-12-13 ENCOUNTER — Emergency Department (HOSPITAL_COMMUNITY): Payer: 59

## 2021-12-13 ENCOUNTER — Encounter (HOSPITAL_COMMUNITY): Payer: Self-pay | Admitting: Emergency Medicine

## 2021-12-13 ENCOUNTER — Emergency Department (HOSPITAL_COMMUNITY)
Admission: EM | Admit: 2021-12-13 | Discharge: 2021-12-13 | Disposition: A | Discharge status: Home / Self Care | Payer: 59 | Attending: Emergency Medicine | Admitting: Emergency Medicine

## 2021-12-13 DIAGNOSIS — I10 Essential (primary) hypertension: Secondary | ICD-10-CM | POA: Diagnosis not present

## 2021-12-13 DIAGNOSIS — R200 Anesthesia of skin: Secondary | ICD-10-CM | POA: Insufficient documentation

## 2021-12-13 DIAGNOSIS — Z79899 Other long term (current) drug therapy: Secondary | ICD-10-CM | POA: Insufficient documentation

## 2021-12-13 DIAGNOSIS — D649 Anemia, unspecified: Secondary | ICD-10-CM | POA: Insufficient documentation

## 2021-12-13 DIAGNOSIS — R0789 Other chest pain: Secondary | ICD-10-CM | POA: Insufficient documentation

## 2021-12-13 DIAGNOSIS — R2990 Unspecified symptoms and signs involving the nervous system: Secondary | ICD-10-CM | POA: Diagnosis not present

## 2021-12-13 DIAGNOSIS — Z20822 Contact with and (suspected) exposure to covid-19: Secondary | ICD-10-CM | POA: Diagnosis not present

## 2021-12-13 DIAGNOSIS — R079 Chest pain, unspecified: Secondary | ICD-10-CM | POA: Diagnosis present

## 2021-12-13 DIAGNOSIS — I160 Hypertensive urgency: Secondary | ICD-10-CM

## 2021-12-13 LAB — DIFFERENTIAL
Abs Immature Granulocytes: 0.01 10*3/uL (ref 0.00–0.07)
Basophils Absolute: 0 10*3/uL (ref 0.0–0.1)
Basophils Relative: 0 %
Eosinophils Absolute: 0.1 10*3/uL (ref 0.0–0.5)
Eosinophils Relative: 1 %
Immature Granulocytes: 0 %
Lymphocytes Relative: 40 %
Lymphs Abs: 2.8 10*3/uL (ref 0.7–4.0)
Monocytes Absolute: 0.5 10*3/uL (ref 0.1–1.0)
Monocytes Relative: 8 %
Neutro Abs: 3.6 10*3/uL (ref 1.7–7.7)
Neutrophils Relative %: 51 %

## 2021-12-13 LAB — APTT: aPTT: 28 seconds (ref 24–36)

## 2021-12-13 LAB — CBC
HCT: 32.4 % — ABNORMAL LOW (ref 36.0–46.0)
Hemoglobin: 11.5 g/dL — ABNORMAL LOW (ref 12.0–15.0)
MCH: 29 pg (ref 26.0–34.0)
MCHC: 35.5 g/dL (ref 30.0–36.0)
MCV: 81.6 fL (ref 80.0–100.0)
Platelets: 271 10*3/uL (ref 150–400)
RBC: 3.97 MIL/uL (ref 3.87–5.11)
RDW: 14.9 % (ref 11.5–15.5)
WBC: 6.8 10*3/uL (ref 4.0–10.5)
nRBC: 0 % (ref 0.0–0.2)

## 2021-12-13 LAB — BASIC METABOLIC PANEL
Anion gap: 7 (ref 5–15)
BUN: 23 mg/dL — ABNORMAL HIGH (ref 6–20)
CO2: 24 mmol/L (ref 22–32)
Calcium: 9.5 mg/dL (ref 8.9–10.3)
Chloride: 108 mmol/L (ref 98–111)
Creatinine, Ser: 0.96 mg/dL (ref 0.44–1.00)
GFR, Estimated: >60 mL/min (ref >60)
Glucose, Bld: 109 mg/dL — ABNORMAL HIGH (ref 70–99)
Potassium: 3.8 mmol/L (ref 3.5–5.1)
Sodium: 139 mmol/L (ref 135–145)

## 2021-12-13 LAB — I-STAT CHEM 8, ED
BUN: 21 mg/dL — ABNORMAL HIGH (ref 6–20)
Calcium, Ion: 1.2 mmol/L (ref 1.15–1.40)
Chloride: 106 mmol/L (ref 98–111)
Creatinine, Ser: 1.1 mg/dL — ABNORMAL HIGH (ref 0.44–1.00)
Glucose, Bld: 103 mg/dL — ABNORMAL HIGH (ref 70–99)
HCT: 33 % — ABNORMAL LOW (ref 36.0–46.0)
Hemoglobin: 11.2 g/dL — ABNORMAL LOW (ref 12.0–15.0)
Potassium: 3.8 mmol/L (ref 3.5–5.1)
Sodium: 140 mmol/L (ref 135–145)
TCO2: 25 mmol/L (ref 22–32)

## 2021-12-13 LAB — COMPREHENSIVE METABOLIC PANEL
ALT: 22 U/L (ref 0–44)
AST: 21 U/L (ref 15–41)
Albumin: 4.5 g/dL (ref 3.5–5.0)
Alkaline Phosphatase: 92 U/L (ref 38–126)
Anion gap: 7 (ref 5–15)
BUN: 24 mg/dL — ABNORMAL HIGH (ref 6–20)
CO2: 24 mmol/L (ref 22–32)
Calcium: 9.4 mg/dL (ref 8.9–10.3)
Chloride: 109 mmol/L (ref 98–111)
Creatinine, Ser: 0.98 mg/dL (ref 0.44–1.00)
GFR, Estimated: >60 mL/min (ref >60)
Glucose, Bld: 107 mg/dL — ABNORMAL HIGH (ref 70–99)
Potassium: 4.3 mmol/L (ref 3.5–5.1)
Sodium: 140 mmol/L (ref 135–145)
Total Bilirubin: 0.3 mg/dL (ref 0.3–1.2)
Total Protein: 8 g/dL (ref 6.5–8.1)

## 2021-12-13 LAB — URINALYSIS, ROUTINE W REFLEX MICROSCOPIC
Bilirubin Urine: NEGATIVE
Glucose, UA: NEGATIVE mg/dL
Hgb urine dipstick: NEGATIVE
Ketones, ur: NEGATIVE mg/dL
Leukocytes,Ua: NEGATIVE
Nitrite: NEGATIVE
Protein, ur: NEGATIVE mg/dL
Specific Gravity, Urine: 1.013 (ref 1.005–1.030)
pH: 5 (ref 5.0–8.0)

## 2021-12-13 LAB — RESP PANEL BY RT-PCR (FLU A&B, COVID) ARPGX2
Influenza A by PCR: NEGATIVE
Influenza B by PCR: NEGATIVE
SARS Coronavirus 2 by RT PCR: NEGATIVE

## 2021-12-13 LAB — RAPID URINE DRUG SCREEN, HOSP PERFORMED
Amphetamines: NOT DETECTED
Barbiturates: NOT DETECTED
Benzodiazepines: NOT DETECTED
Cocaine: NOT DETECTED
Opiates: NOT DETECTED
Tetrahydrocannabinol: NOT DETECTED

## 2021-12-13 LAB — PROTIME-INR
INR: 1 (ref 0.8–1.2)
Prothrombin Time: 13.5 seconds (ref 11.4–15.2)

## 2021-12-13 LAB — TROPONIN I (HIGH SENSITIVITY)
Troponin I (High Sensitivity): <2 ng/L (ref <18)
Troponin I (High Sensitivity): 3 ng/L (ref <18)

## 2021-12-13 LAB — I-STAT BETA HCG BLOOD, ED (MC, WL, AP ONLY): I-stat hCG, quantitative: <5 m[IU]/mL (ref <5)

## 2021-12-13 LAB — CBG MONITORING, ED: Glucose-Capillary: 101 mg/dL — ABNORMAL HIGH (ref 70–99)

## 2021-12-13 LAB — ETHANOL: Alcohol, Ethyl (B): <10 mg/dL (ref <10)

## 2021-12-13 MED ORDER — OXYCODONE-ACETAMINOPHEN 5-325 MG PO TABS
1.0000 | ORAL_TABLET | Freq: Once | ORAL | Status: AC
  Administered 2021-12-13: 1 via ORAL
  Filled 2021-12-13: qty 1

## 2021-12-13 MED ORDER — LISINOPRIL 10 MG PO TABS: 10.0000 mg | ORAL_TABLET | Freq: Every day | ORAL | 0 refills | Status: DC

## 2021-12-13 MED ORDER — LORAZEPAM 2 MG/ML IJ SOLN
1.0000 mg | Freq: Once | INTRAMUSCULAR | Status: AC
  Administered 2021-12-13: 1 mg via INTRAVENOUS
  Filled 2021-12-13: qty 1

## 2021-12-13 MED ORDER — GADOBUTROL 1 MMOL/ML IV SOLN
7.5000 mL | Freq: Once | INTRAVENOUS | Status: AC | PRN
  Administered 2021-12-13: 7.5 mL via INTRAVENOUS

## 2021-12-13 MED ORDER — LISINOPRIL 10 MG PO TABS: 20.0000 mg | ORAL_TABLET | Freq: Every day | ORAL | 0 refills | Status: DC

## 2021-12-13 NOTE — ED Provider Notes (Signed)
[facility]-EMERGENCY DEPT Provider Note   CSN: [id number] Arrival date & time: 12/13/21  1240     History {Add pertinent medical, surgical, social history, OB history to HPI:1} Chief Complaint  Patient presents with   Chest Pain    Barbara [name] is a 61 y.o. female.  Patient has a history of GERD.  She states around noon today she started having numbness in her left arm and left foot.  Also patient has had some difficulty speaking and also complains of chest pain.  The history is provided by the patient and medical records. No language interpreter was used.  Chest Pain Pain location:  L chest Pain quality: aching   Pain radiates to:  Does not radiate Pain severity:  Moderate Onset quality:  Sudden Timing:  Intermittent Progression:  Waxing and waning Chronicity:  New Context: not breathing   Relieved by:  Nothing Worsened by:  Nothing Associated symptoms: no abdominal pain, no back pain, no cough, no fatigue and no headache        Home Medications Prior to Admission medications   Medication Sig Start Date End Date Taking? Authorizing Provider  lisinopril (ZESTRIL) 10 MG tablet Take 1 tablet (10 mg total) by mouth daily. 12/13/21   Bethann Berkshire, MD  loratadine (CLARITIN) 10 MG tablet Take 10 mg by mouth as needed.  Patient not taking: Reported on 11/25/2021    [provider]  meloxicam (MOBIC) 15 MG tablet Take 1 tablet by mouth daily. Patient not taking: Reported on 11/25/2021 09/19/17   [provider]  omeprazole (PRILOSEC) 20 MG capsule Take 20 mg by mouth as needed.  Patient not taking: Reported on 11/25/2021    [provider]  pantoprazole (PROTONIX) 40 MG tablet 1 tablet 12/26/14   [provider]  sucralfate (CARAFATE) 1 GM/10ML suspension Take 10 mLs (1 g total) by mouth 4 (four) times daily -  with meals and at bedtime. 08/15/16   Palumbo, April, MD      Allergies    Iodinated contrast media, Iodine,  Morphine and related, Other, Sulfa antibiotics, and Sulfamethoxazole-trimethoprim    Review of Systems   Review of Systems  Constitutional:  Negative for appetite change and fatigue.  HENT:  Negative for congestion, ear discharge and sinus pressure.   Eyes:  Negative for discharge.  Respiratory:  Negative for cough.   Cardiovascular:  Positive for chest pain.  Gastrointestinal:  Negative for abdominal pain and diarrhea.  Genitourinary:  Negative for frequency and hematuria.  Musculoskeletal:  Negative for back pain.  Skin:  Negative for rash.  Neurological:  Negative for seizures and headaches.       Numbness to left hand and left foot  Psychiatric/Behavioral:  Negative for hallucinations.     Physical Exam Updated Vital Signs BP (!) 155/99   Pulse 80   Temp 98.2 F (36.8 C) (Oral)   Resp 16   Ht 5' 8.5" (1.74 m)   Wt 75.8 kg   LMP 09/29/2021   SpO2 97%   BMI 25.02 kg/m  Physical Exam Vitals and nursing note reviewed.  Constitutional:      Appearance: She is well-developed.  HENT:     Head: Normocephalic.     Nose: Nose normal.  Eyes:     General: No scleral icterus.    Conjunctiva/sclera: Conjunctivae normal.  Neck:     Thyroid: No thyromegaly.  Cardiovascular:     Rate and Rhythm: Normal rate and regular rhythm.  Heart sounds: No murmur heard.    No friction rub. No gallop.  Pulmonary:     Breath sounds: No stridor. No wheezing or rales.  Chest:     Chest wall: No tenderness.  Abdominal:     General: There is no distension.     Tenderness: There is no abdominal tenderness. There is no rebound.  Musculoskeletal:        General: Normal range of motion.     Cervical back: Neck supple.  Lymphadenopathy:     Cervical: No cervical adenopathy.  Skin:    Findings: No erythema or rash.  Neurological:     Mental Status: She is alert and oriented to person, place, and time.     Motor: No abnormal muscle tone.     Coordination: Coordination normal.      Comments: Patient with minimal numbness to left hand and left foot with some slurred speech  Psychiatric:        Behavior: Behavior normal.     ED Results / Procedures / Treatments   Labs (all labs ordered are listed, but only abnormal results are displayed) Labs Reviewed  BASIC METABOLIC PANEL - Abnormal; Notable for the following components:      Result Value   Glucose, Bld 109 (*)    BUN 23 (*)    All other components within normal limits  CBC - Abnormal; Notable for the following components:   Hemoglobin 11.5 (*)    HCT 32.4 (*)    All other components within normal limits  COMPREHENSIVE METABOLIC PANEL - Abnormal; Notable for the following components:   Glucose, Bld 107 (*)    BUN 24 (*)    All other components within normal limits  I-STAT CHEM 8, ED - Abnormal; Notable for the following components:   BUN 21 (*)    Creatinine, Ser 1.10 (*)    Glucose, Bld 103 (*)    Hemoglobin 11.2 (*)    HCT 33.0 (*)    All other components within normal limits  RESP PANEL BY RT-PCR (FLU A&B, COVID) ARPGX2  ETHANOL  PROTIME-INR  APTT  DIFFERENTIAL  RAPID URINE DRUG SCREEN, HOSP PERFORMED  URINALYSIS, ROUTINE W REFLEX MICROSCOPIC  I-STAT BETA HCG BLOOD, ED ([facility], [facility], [facility] ONLY)  TROPONIN I (HIGH SENSITIVITY)  TROPONIN I (HIGH SENSITIVITY)    EKG EKG Interpretation  Date/Time:  Monday December 13 2021 13:21:23 EDT Ventricular Rate:  88 PR Interval:  141 QRS Duration: 84 QT Interval:  378 QTC Calculation: 458 R Axis:   57 Text Interpretation: Sinus rhythm Borderline repolarization abnormality Confirmed by Bethann Berkshire [phone]) on 12/13/2021 4:47:31 PM  Radiology MR ANGIO CHEST W WO CONTRAST  Result Date: 12/13/2021 CLINICAL DATA:  Thoracic aortic disease, preoperative planning EXAM: MRA CHEST WITH OR WITHOUT CONTRAST TECHNIQUE: Angiographic images of the chest were obtained using MRA technique without and with intravenous contrast. CONTRAST:  7.5 mL GADAVIST GADOBUTROL 1 MMOL/ML IV  SOLN COMPARISON:  CT scan of the chest 12/03/2014 FINDINGS: VASCULAR Aorta: Moderate motion related artifact. Correlation is made with prior CTA of the chest 12/03/2014. Variant arch anatomy. The right brachiocephalic and left common carotid artery share a common origin. The left vertebral artery arises directly from the aorta. The aorta itself is normal in caliber. No evidence of dissection or aneurysm. Heart: The heart is normal in size.  No pericardial effusion. Pulmonary Arteries:  Normal caliber main pulmonary artery. Other: None. NON-VASCULAR Tiny T2 hyperintense, T1 isointense nonenhancing cystic lesion in the  right hemi liver consistent with a benign cyst. No focal signal abnormality or abnormal enhancement within the visualized lungs, mediastinum, upper abdomen or musculoskeletal structures. IMPRESSION: 1. Right brachiocephalic and left common carotid arteries share a common origin. 2. The left vertebral artery arises directly from the aorta. 3. Somewhat limited evaluation due to motion related artifact. No evidence of significant stenosis, occlusion or other acute arterial abnormality. 4. No evidence of aortic aneurysm or dissection. 5. No acute cardiopulmonary process. Electronically Signed   By: Malachy Moan M.D.   On: 12/13/2021 16:13   MR ANGIO NECK WO CONTRAST  Result Date: 12/13/2021 CLINICAL DATA:  Neuro deficit, acute, stroke suspected EXAM: MRA NECK WITHOUT CONTRAST TECHNIQUE: Angiographic images of the neck were acquired using MRA technique without intravenous contrast. Carotid stenosis measurements (when applicable) are obtained utilizing NASCET criteria, using the distal internal carotid diameter as the denominator. COMPARISON:  None Available. FINDINGS: Aortic arch: Great vessel origins are patent. Right carotid system: Motion limited evaluation without visible hemodynamically significant stenosis. Loss of flow related signal at the skull base is favored to be artifactual given vessel  look patent on recent MRA head and tortuosity of the vessels region. Left carotid system: Motion limited without visible hemodynamically significant stenosis. Vertebral arteries: Right dominant. Motion limited evaluation, particularly of the origins. No visible hemodynamically significant stenosis. In plane flow limits assessment of the V3 vertebral arteries. IMPRESSION: No visible hemodynamically significant stenosis in the neck. Electronically Signed   By: Feliberto Harts M.D.   On: 12/13/2021 16:06   MR ANGIO HEAD WO CONTRAST  Result Date: 12/13/2021 CLINICAL DATA:  Neuro deficit, acute, stroke suspected EXAM: MRA HEAD WITHOUT CONTRAST TECHNIQUE: Angiographic images of the Circle of Willis were acquired using MRA technique without intravenous contrast. COMPARISON:  None Available. FINDINGS: Mildly motion limited study. Anterior circulation: Bilateral intracranial ICAs, MCAs, and ACAs are patent without proximal hemodynamically significant stenosis. Posterior circulation: Right dominant intradural vertebral artery. Bilateral intradural vertebral arteries, basilar artery, and bilateral posterior cerebral arteries are patent without proximal hemodynamically significant stenosis. IMPRESSION: No emergent large vessel occlusion or proximal hemodynamically significant stenosis. Electronically Signed   By: Feliberto Harts M.D.   On: 12/13/2021 15:31   MR BRAIN WO CONTRAST  Result Date: 12/13/2021 CLINICAL DATA:  Provided history: Neuro deficit, acute, stroke suspected. EXAM: MRI HEAD WITHOUT CONTRAST TECHNIQUE: Multiplanar, multiecho pulse sequences of the brain and surrounding structures were obtained without intravenous contrast. COMPARISON:  Head CT 12/13/2021. Brain MRI 12/03/2014. FINDINGS: Brain: No age advanced or lobar predominant parenchymal atrophy. Overall mild multifocal T2 FLAIR hyperintense signal abnormality within the cerebral white matter, nonspecific but most often secondary to chronic small  vessel ischemia. There is no acute infarct. No evidence of an intracranial mass. No chronic intracranial blood products. No extra-axial fluid collection. No midline shift. Vascular: Maintained flow voids within the proximal large arterial vessels. Skull and upper cervical spine: No focal suspicious marrow lesion. Sinuses/Orbits: No mass or acute finding within the imaged orbits. No significant paranasal sinus disease. IMPRESSION: No evidence of acute intracranial abnormality. Overall mild multifocal T2 FLAIR hyperintense signal abnormality within the cerebral white matter, nonspecific but most often secondary to chronic small vessel ischemia. These signal changes are new from the prior MRI of 12/03/2014. Electronically Signed   By: Jackey Loge D.O.   On: 12/13/2021 15:17   CT HEAD CODE STROKE WO CONTRAST  Result Date: 12/13/2021 CLINICAL DATA:  Code stroke. Central chest pain with tingling in fingers and toes. EXAM:  CT HEAD WITHOUT CONTRAST TECHNIQUE: Contiguous axial images were obtained from the base of the skull through the vertex without intravenous contrast. RADIATION DOSE REDUCTION: This exam was performed according to the departmental dose-optimization program which includes automated exposure control, adjustment of the mA and/or kV according to patient size and/or use of iterative reconstruction technique. COMPARISON:  MR head 12/03/2014 FINDINGS: Brain: There is no acute intracranial hemorrhage, extra-axial fluid collection, or acute infarct. Parenchymal volume is normal. The ventricles are normal in size. Gray-white differentiation is preserved. There is no mass lesion.  There is no mass effect or midline shift. Vascular: No hyperdense vessel or unexpected calcification. Skull: Normal. Negative for fracture or focal lesion. Sinuses/Orbits: The imaged paranasal sinuses are clear. The imaged globes and orbits are unremarkable. Other: None. ASPECTS [facility]([place] Stroke Program Early CT Score) - Ganglionic  level infarction (caudate, lentiform nuclei, internal capsule, insula, M1-M3 cortex): 7 - Supraganglionic infarction (M4-M6 cortex): 3 Total score (0-10 with 10 being normal): 10 IMPRESSION: 1. No acute intracranial pathology. 2. ASPECTS is 10 These results were called by telephone at the time of interpretation on 12/13/2021 at 1:51 pm to provider Dr Estell HarpinZammit, who verbally acknowledged these results. Electronically Signed   By: Lesia HausenPeter  Noone M.D.   On: 12/13/2021 13:53   DG Chest 2 View  Result Date: 12/13/2021 CLINICAL DATA:  A 61 year old female presents for evaluation of chest pain. EXAM: CHEST - 2 VIEW COMPARISON:  October 22, 2017. FINDINGS: Trachea midline. Cardiomediastinal contours and hilar structures are normal. Lungs are clear. No pneumothorax. No pleural effusion. On limited assessment there is no acute skeletal process. IMPRESSION: No acute cardiopulmonary disease. Electronically Signed   By: Donzetta KohutGeoffrey  Wile M.D.   On: 12/13/2021 13:18    Procedures Procedures  {Document cardiac monitor, telemetry assessment procedure when appropriate:1}  Medications Ordered in ED Medications  LORazepam (ATIVAN) injection 1 mg (1 mg Intravenous Given 12/13/21 1431)  gadobutrol (GADAVIST) 1 MMOL/ML injection 7.5 mL (7.5 mLs Intravenous Contrast Given 12/13/21 1540)  oxyCODONE-acetaminophen (PERCOCET/ROXICET) 5-325 MG per tablet 1 tablet (1 tablet Oral Given 12/13/21 1621)    ED Course/ Medical Decision Making/ A&P  CRITICAL CARE Performed by: Bethann BerkshireJoseph Laurel Harnden Total critical care time: *** minutes Critical care time was exclusive of separately billable procedures and treating other patients. Critical care was necessary to treat or prevent imminent or life-threatening deterioration. Critical care was time spent personally by me on the following activities: development of treatment plan with patient and/or surrogate as well as nursing, discussions with consultants, evaluation of patient's response to treatment,  examination of patient, obtaining history from patient or surrogate, ordering and performing treatments and interventions, ordering and review of laboratory studies, ordering and review of radiographic studies, pulse oximetry and re-evaluation of patient's condition. Patient presented with numbness to the left arm and left foot.  And she also complains of some chest discomfort.  Code stroke was called and Dr. Wilford CornerArora saw the patient and after the CT head was negative he wanted to get an MRI of her head stat before he made the determination to start thrombolytics.  Patient also had the chest discomfort which brought in the possibility of thoracic dissection.  Patient has an allergy to contrast dye so she ended up getting a MRI of her head and her chest.  All the studies were negative.  Suspect symptoms stress and anxiety.  Patient also had hypertension that is untreated.  Medical Decision Making Amount and/or Complexity of Data Reviewed Labs: ordered. Radiology: ordered.  Risk Prescription drug management.   Patient with numbness to left arm and left foot along with chest pain and normal studies.  Stroke and thoracic dissection has been ruled out.  Patient has pending second troponin.  She will be given some lisinopril at discharge  {Document critical care time when appropriate:1} {Document review of labs and clinical decision tools ie heart score, Chads2Vasc2 etc:1}  {Document your independent review of radiology images, and any outside records:1} {Document your discussion with family members, caretakers, and with consultants:1} {Document social determinants of health affecting pt's care:1} {Document your decision making why or why not admission, treatments were needed:1} Final Clinical Impression(s) / ED Diagnoses Final diagnoses:  Atypical chest pain  Primary hypertension  Stroke-like symptoms    Rx / DC Orders ED Discharge Orders          Ordered    lisinopril  (ZESTRIL) 10 MG tablet  Daily,   Status:  Discontinued        12/13/21 1648    lisinopril (ZESTRIL) 10 MG tablet  Daily        12/13/21 1649

## 2021-12-13 NOTE — Telephone Encounter (Signed)
Pt called and stated she was having chest pain and she think's it is heart burn. I called triage nurse lanette and transferrte pt to the nurse

## 2021-12-13 NOTE — ED Provider Triage Note (Signed)
Emergency Medicine Provider Triage Evaluation Note  Barbara [name] , a 61 y.o. female  was evaluated in triage.  Pt complains of dizziness, left hand and left foot numbness, chest pain, shortness of breath since noon.  Patient states that symptom onset was insidious and has gradually worsened since onset.  She denies history of vertigo or episodes of dizziness like this in the past.  She secondarily endorses finding increased difficulty finding words as well as some increased blurred vision.  She denies abnormality of gait, weakness, increased pain, slurred speech.  Review of Systems  Positive:  Negative:   Physical Exam  BP (!) 172/105   Pulse 96   Temp 98.2 F (36.8 C) (Oral)   Resp 18   Ht 5' 8.5" (1.74 m)   Wt 75.8 kg   LMP 09/29/2021   SpO2 100%   BMI 25.02 kg/m  Gen:   Awake, no distress   Resp:  Normal effort  MSK:   Moves extremities without difficulty  Other:  PERRLA bilaterally.  EOMs full and intact bilaterally.  Cranial nerves III through XII grossly intact.  Muscle strength equal for upper and lower extremities.  Patient complains of numbness in left hand and left foot.  Medical Decision Making  Medically screening exam initiated at 1:34 PM.  Appropriate orders placed.  Tasheema Perrone was informed that the remainder of the evaluation will be completed by another provider, this initial triage assessment does not replace that evaluation, and the importance of remaining in the ED until their evaluation is complete.     Peter Garter, [place] 12/13/21 1335

## 2021-12-13 NOTE — Progress Notes (Signed)
MR brain completed and reviewed-no evidence of acute intracranial abnormality. No need for IV thrombolysis. Chest discomfort as well as left-sided paresthesias in the setting of extremely elevated blood pressures likely represent hypertensive urgency/emergency. I would recommend treating it as such. Medical management of hypertensive urgency/emergency per primary team. No further inpatient neurological recommendations at this time. Plan discussed with Dr. Estell Harpin.  -- Milon Dikes, MD Neurologist [facility] Pager: [phone]    Additional 10 minutes in reviewing imaging as well as communicating with the ED provider

## 2021-12-13 NOTE — ED Triage Notes (Signed)
Patient c/o central chest pain with tingling to fingers and toes x1 hour. Denies HTN history.

## 2021-12-13 NOTE — Consult Note (Addendum)
[facility] Telemedicine Consult   Requesting Provider: Dr. Estell HarpinZammit Consult Participants: Dr. Marthe PatchA. Arlicia Paquette, Telespecialist RN-Anisa   bedside RN-currently Location of the provider[facility]  This consult was provided via telemedicine with 2-way video and audio communication. The patient/family was informed that care would be provided in this way and agreed to receive care in this manner.    Chief Complaint: Chest pain, left-sided tingling and numbness  HPI: Barbara [name] is a 61 y.o. female past medical history of sickle cell anemia, gastroesophageal reflux disease, no blood thinners, presented to the emergency room for evaluation of chest pain and tingling on the left side. She reports last known well at 12 PM. Reports that she was walking outside and had a sudden onset of chest discomfort in the middle of her chest followed by tingling in her fingers and toes which has since gotten worse. She was at the GI doctor's office and her blood pressure was higher than usual.  She usually runs low with systolic in the 90s but it was much higher today.  In the emergency room, systolic blood pressures range from 180s to 200s. A twelve-lead EKG was done and was not suggestive of a STEMI. Code stroke was activated due to focal symptoms of sudden onset. Denies any prior history of strokes or heart attacks. Last sickle cell crisis was 12 years ago. Reports symptoms being worse in chest pain being more in the center and back of the chest.  Past Medical History:  Diagnosis Date   GERD (gastroesophageal reflux disease)    Low iron    Sickle cell anemia (HCC)      Current Facility-Administered Medications:    LORazepam (ATIVAN) injection 1 mg, 1 mg, Intravenous, Once, Bethann BerkshireZammit, Joseph, MD  Current Outpatient Medications:    loratadine (CLARITIN) 10 MG tablet, Take 10 mg by mouth as needed.  (Patient not taking: Reported on 11/25/2021), Disp: ,  Rfl:    meloxicam (MOBIC) 15 MG tablet, Take 1 tablet by mouth daily. (Patient not taking: Reported on 11/25/2021), Disp: , Rfl: 1   omeprazole (PRILOSEC) 20 MG capsule, Take 20 mg by mouth as needed.  (Patient not taking: Reported on 11/25/2021), Disp: , Rfl:    pantoprazole (PROTONIX) 40 MG tablet, 1 tablet, Disp: , Rfl:    sucralfate (CARAFATE) 1 GM/10ML suspension, Take 10 mLs (1 g total) by mouth 4 (four) times daily -  with meals and at bedtime., Disp: 420 mL, Rfl: 0    LKW: Noon tpa given?: No, mild symptoms, concern for aortic dissection IR Thrombectomy? No, exam not consistent with LVO Modified Rankin Scale: 0-Completely asymptomatic and back to baseline post- stroke Time of teleneurologist evaluation: 1357 Page received 1356  Exam: Vitals:   12/13/21 1246 12/13/21 1353  BP: (!) 172/105 (!) 188/109  Pulse: 96 88  Resp: 18 14  Temp: 98.2 F (36.8 C)   SpO2: 100% 100%    General: Awake alert in no acute distress Neurologic exam Awake alert oriented x3, no dysarthria, no aphasia, no cranial nerve deficits, no motor deficits.  No sensory deficits on exam although she says that she subjectively just feels that there is pins and needle sensation in the left arm and leg.  No extinction.  No dysmetria.   NIHSS-1 for sensory  Imaging Reviewed:-No acute findings on CT head-aspects 10.  No bleed.  Labs reviewed in epic and pertinent values follow: CBC    Component Value Date/Time  WBC 6.8 12/13/2021 1311   RBC 3.97 12/13/2021 1311   HGB 11.2 (L) 12/13/2021 1344   HCT 33.0 (L) 12/13/2021 1344   PLT 271 12/13/2021 1311   MCV 81.6 12/13/2021 1311   MCH 29.0 12/13/2021 1311   MCHC 35.5 12/13/2021 1311   RDW 14.9 12/13/2021 1311   LYMPHSABS 2.8 12/13/2021 1334   MONOABS 0.5 12/13/2021 1334   EOSABS 0.1 12/13/2021 1334   BASOSABS 0.0 12/13/2021 1334   CMP     Component Value Date/Time   NA 140 12/13/2021 1344   K 3.8 12/13/2021 1344   CL 106 12/13/2021 1344   CO2 24  12/13/2021 1311   GLUCOSE 103 (H) 12/13/2021 1344   BUN 21 (H) 12/13/2021 1344   CREATININE 1.10 (H) 12/13/2021 1344   CALCIUM 9.5 12/13/2021 1311   PROT 7.9 10/18/2021 1703   ALBUMIN 4.3 10/18/2021 1703   AST 20 10/18/2021 1703   ALT 22 10/18/2021 1703   ALKPHOS 101 10/18/2021 1703   BILITOT 0.5 10/18/2021 1703   GFRNONAA >60 12/13/2021 1311   GFRAA >60 07/17/2018 2006     Assessment:  61 year old with a history of sickle cell anemia presenting for evaluation of sudden onset of chest pressure tightness and pain along with left arm and leg tingling.  Her symptoms of gotten worse over the past hour or so. She denies any headaches, visual symptoms.  On examination she does not have any speech, cranial nerve, motor or coordination deficits.  She also does not have any sensory deficits when checked at bedside but reports a subjective sensation of tingling that she feels for which she has an NIH stroke scale of 1. Given the starting of the symptoms with chest pressure and discomfort in the middle of the chest and back, it is prudent to rule out aortic dissection as a cause. Stat CTA head and neck was ordered along with CTA of the chest but she has anaphylaxis to iodinated contrast. We will order stat MRI of the brain, MRA head and neck and an MRI modality for dissection.  Recommendations:  Exam not consistent with LVO hence not a candidate for EVT. Symptoms are mild hence not a candidate for tPA.  Also concern for dissection-unless it is ruled out, tPA not prudent. Stat MRI of the brain, MRA head without contrast and MRA neck without contrast along with an MRI modality for ruling out aortic dissection. Neurology will follow  Discussed with EDP Dr. Estell Harpin resting on hospital  This patient is receiving care for possible acute neurological changes. There was 32 minutes of care by this provider at the time of service, including time for direct evaluation via telemedicine, review of medical  records, imaging studies and discussion of findings with providers, the patient and/or family.  -- Milon Dikes, MD [facility] Pager: [phone] If 7pm to 7am, please call on call as listed on AMION.   CRITICAL CARE ATTESTATION Performed by: Milon Dikes, MD Total critical care time: 32 minutes Critical care time was exclusive of separately billable procedures and treating other patients and/or supervising APPs/Residents/Students Critical care was necessary to treat or prevent imminent or life-threatening deterioration due to evaluation for strokelike symptoms This patient is critically ill and at significant risk for neurological worsening and/or death and care requires constant monitoring. Critical care was time spent personally by me on the following activities: development of treatment plan with patient and/or surrogate as well as nursing, discussions with consultants, evaluation of patient's response to treatment, examination of  patient, obtaining history from patient or surrogate, ordering and performing treatments and interventions, ordering and review of laboratory studies, ordering and review of radiographic studies, pulse oximetry, re-evaluation of patient's condition, participation in multidisciplinary rounds and medical decision making of high complexity in the care of this patient.

## 2021-12-13 NOTE — Consult Note (Signed)
Code stroke activated at 1351.  Dr Wilford Corner on camera for neuro exam at 1358. Decision for no TNK at 1405.

## 2021-12-13 NOTE — Discharge Instructions (Addendum)
Your second heart number returned and was normal and we do not see signs of heart attack.  Please follow-up with your family doctor within a week for recheck and begin the blood pressure medications as prescribed.

## 2021-12-14 NOTE — Telephone Encounter (Signed)
error 

## 2021-12-23 ENCOUNTER — Ambulatory Visit (INDEPENDENT_AMBULATORY_CARE_PROVIDER_SITE_OTHER): Payer: 59 | Admitting: Family Medicine

## 2021-12-23 ENCOUNTER — Encounter: Payer: Self-pay | Admitting: Family Medicine

## 2021-12-23 VITALS — BP 136/86 | HR 74 | Temp 97.5°F | Wt 169.4 lb

## 2021-12-23 DIAGNOSIS — I10 Essential (primary) hypertension: Secondary | ICD-10-CM | POA: Diagnosis not present

## 2021-12-23 DIAGNOSIS — D5700 Hb-SS disease with crisis, unspecified: Secondary | ICD-10-CM | POA: Diagnosis not present

## 2021-12-23 DIAGNOSIS — N6489 Other specified disorders of breast: Secondary | ICD-10-CM

## 2021-12-23 DIAGNOSIS — Z1231 Encounter for screening mammogram for malignant neoplasm of breast: Secondary | ICD-10-CM

## 2021-12-23 NOTE — Assessment & Plan Note (Signed)
Patient with recent episode of chest pain and left-sided numbness, ED work-up negative for stroke and acute MI Reports improvement Has scheduled follow-up cardiology, however given history of sickle cell, presumed that this was crisis Discussed ongoing treatment management, recommend patient follow-up with cardiology to rule out any type of ischemic or other cardio pathic issues, if this is negative can refer to hematology to evaluate sickle cell further

## 2021-12-23 NOTE — Assessment & Plan Note (Signed)
Improved Started on lisinopril 10 mg in ED, tolerating well, recent BMP with normal creatinine Follow-up 3 months recheck Continue to monitor

## 2021-12-23 NOTE — Progress Notes (Signed)
Assessment/Plan:   Problem List Items Addressed This Visit       Cardiovascular and Mediastinum   Primary hypertension    Improved Started on lisinopril 10 mg in ED, tolerating well, recent BMP with normal creatinine Follow-up 3 months recheck Continue to monitor        Other   Sickle cell crisis [facility])    Patient with recent episode of chest pain and left-sided numbness, ED work-up negative for stroke and acute MI Reports improvement Has scheduled follow-up cardiology, however given history of sickle cell, presumed that this was crisis Discussed ongoing treatment management, recommend patient follow-up with cardiology to rule out any type of ischemic or other cardio pathic issues, if this is negative can refer to hematology to evaluate sickle cell further      Other Visit Diagnoses     Screening mammogram for breast cancer    -  Primary   Relevant Orders   MM DIGITAL SCREENING BILATERAL          Subjective:  HPI:  Barbara [name] is a 61 y.o. female who has GERD (gastroesophageal reflux disease); Sickle cell crisis (HCC); Abnormal uterine bleeding (AUB); and Primary hypertension on their problem list..   She  has a past medical history of GERD (gastroesophageal reflux disease), Low iron, and Sickle cell anemia (HCC)..   She presents with chief complaint of Follow-up (4 week follow up. Referral mammogram due) .  Patient following up from recent ED visit.  Patient had chest pain and left numbness in arm and hand.  Does report the chest pain is better, reports some mild numbness in hand, but overall improved.  Had significant work-up in the ED including CT MRI of the head to rule out stroke, EKG without ST elevation, negative troponins, CBC with mild anemia but at baseline for patient with chronic peptic ulcer disease and sickle cell anemia.  Patient does have a scheduled follow-up with cardiology in a week.  Patient also here to follow-up on blood pressure, normotensive  today, stable on lisinopril 10 mg daily, no chest pain, shortness of breath, lower extremity swelling, headache, blurry vision  Patient also seen by GI, had EGD that showed multiple ulcers, report in hand, to be scanned in chart patient is on Protonix 40 mg twice daily and Carafate. Past Surgical History:  Procedure Laterality Date   ANTERIOR CRUCIATE LIGAMENT REPAIR     CHOLECYSTECTOMY     ENDOMETRIAL ABLATION     LEFT HEART CATHETERIZATION WITH CORONARY ANGIOGRAM N/A 08/27/2014   Procedure: LEFT HEART CATHETERIZATION WITH CORONARY ANGIOGRAM;  Surgeon: Iran Ouch, MD;  Location: [facility] CATH LAB;  Service: Cardiovascular;  Laterality: N/A;    Outpatient Medications Prior to Visit  Medication Sig Dispense Refill   lisinopril (ZESTRIL) 10 MG tablet Take 1 tablet (10 mg total) by mouth daily. 30 tablet 0   montelukast (SINGULAIR) 10 MG tablet Take by mouth.     pantoprazole (PROTONIX) 40 MG tablet 1 tablet     sucralfate (CARAFATE) 1 GM/10ML suspension Take 10 mLs (1 g total) by mouth 4 (four) times daily -  with meals and at bedtime. 420 mL 0   loratadine (CLARITIN) 10 MG tablet Take 10 mg by mouth as needed.  (Patient not taking: Reported on 11/25/2021)     meloxicam (MOBIC) 15 MG tablet Take 1 tablet by mouth daily. (Patient not taking: Reported on 11/25/2021)  1   omeprazole (PRILOSEC) 20 MG capsule Take 20 mg by mouth as needed.  (  Patient not taking: Reported on 12/23/2021)     No facility-administered medications prior to visit.    Family History  Problem Relation Age of Onset   CAD Brother 14       CABG at age 51, and deceased   CAD Sister 81       1 stent   CAD Father 65       Deceased in 84s    Social History   Socioeconomic History   Marital status: Divorced    Spouse name: Not on file   Number of children: 2   Years of education: Not on file   Highest education level: Not on file  Occupational History   Not on file  Tobacco Use   Smoking status: Former    Years: 1.00     Types: Cigarettes    Quit date: 08/26/2013    Years since quitting: 8.3   Smokeless tobacco: Never  Vaping Use   Vaping Use: Never used  Substance and Sexual Activity   Alcohol use: Yes    Comment: occasionally, glass of wine   Drug use: No   Sexual activity: Not on file  Other Topics Concern   Not on file  Social History Narrative   Not on file   Social Determinants of Health   Financial Resource Strain: Not on file  Food Insecurity: Not on file  Transportation Needs: Not on file  Physical Activity: Not on file  Stress: Not on file  Social Connections: Not on file  Intimate Partner Violence: Not on file                                                                                                 Objective:  Physical Exam: BP 136/86 (BP Location: Left Arm, Patient Position: Sitting, Cuff Size: Large)   Pulse 74   Temp (!) 97.5 F (36.4 C) (Temporal)   Wt 169 lb 6.4 oz (76.8 kg)   LMP 09/29/2021   SpO2 98%   BMI 25.38 kg/m    General: No acute distress. Awake and conversant.  Eyes: Normal conjunctiva, anicteric. Round symmetric pupils.  PERRLA, EOMI ENT: Hearing grossly intact. No nasal discharge.  Neck: Neck is supple. No masses or thyromegaly.  Respiratory: Respirations are non-labored. No auditory wheezing.  CTA B Skin: Warm. No rashes or ulcers.  Psych: Alert and oriented. Cooperative, Appropriate mood and affect, Normal judgment.  CV: No cyanosis or JVD normal S1-2, RRR MSK: Normal ambulation. No clubbing, 5 5 strength in upper extremities bilaterally, normal grip strength and sensation in upper extremities Neuro: Sensation and CN II-XII intact       Garner Nash, MD, MS

## 2022-01-03 ENCOUNTER — Encounter: Payer: Self-pay | Admitting: Internal Medicine

## 2022-01-03 ENCOUNTER — Ambulatory Visit (INDEPENDENT_AMBULATORY_CARE_PROVIDER_SITE_OTHER): Payer: 59 | Admitting: Internal Medicine

## 2022-01-03 VITALS — BP 132/80 | HR 71 | Ht 68.5 in | Wt 174.6 lb

## 2022-01-03 DIAGNOSIS — R079 Chest pain, unspecified: Secondary | ICD-10-CM | POA: Diagnosis not present

## 2022-01-03 NOTE — Patient Instructions (Signed)

## 2022-01-03 NOTE — Progress Notes (Signed)
Cardiology Office Note:    Date:  01/03/2022   ID:  Barbara [name], DOB 1960/06/11, MRN [id number]  PCP:  Garnette Gunner, MD   [facility] Providers Cardiologist:  None     Referring MD: Garnette Gunner, MD   No chief complaint on file. Chest pain  History of Present Illness:    Barbara [name] is a 60 y.o. female with a hx of GERD, sickle cell anemia, referral for CP  She was at work. Her L hand went numb and her L foot was tingling. She felt out of it. Brain MRI was negative. She noted difficulty breathing. Her chest was tender to the touch. It was central chest pain. She feels better  She said 6-7 years ago she had sharp CP. She had LHC (don't see in the system) done by Dr. Nicolasa Ducking Dr. Allyson Sabal prior for substernal CP, it was normal.  She had stress echo in 2019 was normal.  Family Hx: Father 62 y/o when deceased, MI, Brother -CABG, passed during the surgical period  Social Hx: Former smoker, stopped 10 years ago  Blood pressure good control 132/80 mmhg.  Past Medical History:  Diagnosis Date   GERD (gastroesophageal reflux disease)    Low iron    Sickle cell anemia (HCC)     Past Surgical History:  Procedure Laterality Date   ANTERIOR CRUCIATE LIGAMENT REPAIR     CHOLECYSTECTOMY     ENDOMETRIAL ABLATION     LEFT HEART CATHETERIZATION WITH CORONARY ANGIOGRAM N/A 08/27/2014   Procedure: LEFT HEART CATHETERIZATION WITH CORONARY ANGIOGRAM;  Surgeon: Iran Ouch, MD;  Location: [facility] CATH [facility];  Service: Cardiovascular;  Laterality: N/A;    Current Medications: No outpatient medications have been marked as taking for the 01/03/22 encounter (Appointment) with Maisie Fus, MD.     Allergies:   Iodinated contrast media, Iodine, Morphine and related, Other, Sulfa antibiotics, and Sulfamethoxazole-trimethoprim   Social History   Socioeconomic History   Marital status: Divorced    Spouse name: Not on file   Number of children: 2   Years of education: Not  on file   Highest education level: Not on file  Occupational History   Not on file  Tobacco Use   Smoking status: Former    Years: 1.00    Types: Cigarettes    Quit date: 08/26/2013    Years since quitting: 8.3   Smokeless tobacco: Never  Vaping Use   Vaping Use: Never used  Substance and Sexual Activity   Alcohol use: Yes    Comment: occasionally, glass of wine   Drug use: No   Sexual activity: Not on file  Other Topics Concern   Not on file  Social History Narrative   Not on file   Social Determinants of Health   Financial Resource Strain: Not on file  Food Insecurity: Not on file  Transportation Needs: Not on file  Physical Activity: Not on file  Stress: Not on file  Social Connections: Not on file     Family History: The patient's family history includes CAD (age of onset: 30) in her brother; CAD (age of onset: 15) in her father and sister.  ROS:   Please see the history of present illness.     All other systems reviewed and are negative.  EKGs/Labs/Other Studies Reviewed:    The following studies were reviewed today:   EKG:  EKG is  ordered today.  The ekg ordered today demonstrates NSR  Recent Labs:  12/13/2021: ALT 22; BUN 21; Creatinine, Ser 1.10; Hemoglobin 11.2; Platelets 271; Potassium 3.8; Sodium 140  Recent Lipid Panel No results found for: "CHOL", "TRIG", "HDL", "CHOLHDL", "VLDL", "LDLCALC", "LDLDIRECT"   Risk Assessment/Calculations:           Physical Exam:    VS:   Vitals:   01/03/22 0850  BP: 132/80  Pulse: 71  SpO2: 99%     Wt Readings from Last 3 Encounters:  12/23/21 169 lb 6.4 oz (76.8 kg)  12/13/21 167 lb (75.8 kg)  11/25/21 169 lb 9.6 oz (76.9 kg)     GEN:  Well nourished, well developed in no acute distress HEENT: Normal NECK: No JVD; No carotid bruits LYMPHATICS: No lymphadenopathy CARDIAC: RRR, no murmurs, rubs, gallops RESPIRATORY:  Clear to auscultation without rales, wheezing or rhonchi  ABDOMEN: Soft,  non-tender, non-distended MUSCULOSKELETAL:  No edema; No deformity  SKIN: Warm and dry NEUROLOGIC:  Alert and oriented x 3 PSYCHIATRIC:  Normal affect   ASSESSMENT:   Non cardiac CP: c/f MSK with reproducibility. May be stress related. She's had prior normal LHC and stress echo. No further cardiac w/u recommended at this time. PLAN:    In order of problems listed above:  No further cardiac w/u          Medication Adjustments/Labs and Tests Ordered: Current medicines are reviewed at length with the patient today.  Concerns regarding medicines are outlined above.  No orders of the defined types were placed in this encounter.  No orders of the defined types were placed in this encounter.   There are no Patient Instructions on file for this visit.   Signed, Maisie Fus, MD  01/03/2022 7:53 AM    [facility]

## 2022-01-11 ENCOUNTER — Ambulatory Visit
Admission: RE | Admit: 2022-01-11 | Discharge: 2022-01-11 | Disposition: A | Discharge status: Home / Self Care | Payer: 59 | Source: Ambulatory Visit | Attending: Family Medicine | Admitting: Family Medicine

## 2022-01-11 DIAGNOSIS — Z1231 Encounter for screening mammogram for malignant neoplasm of breast: Secondary | ICD-10-CM

## 2022-01-12 NOTE — Addendum Note (Signed)
Addended by: Garnette Gunner on: 01/12/2022 04:45 PM   Modules accepted: Orders

## 2022-01-13 ENCOUNTER — Other Ambulatory Visit: Payer: Self-pay | Admitting: Family Medicine

## 2022-01-13 DIAGNOSIS — R928 Other abnormal and inconclusive findings on diagnostic imaging of breast: Secondary | ICD-10-CM

## 2022-01-21 ENCOUNTER — Ambulatory Visit
Admission: RE | Admit: 2022-01-21 | Discharge: 2022-01-21 | Disposition: A | Discharge status: Home / Self Care | Payer: 59 | Source: Ambulatory Visit | Attending: Family Medicine | Admitting: Family Medicine

## 2022-01-21 DIAGNOSIS — R928 Other abnormal and inconclusive findings on diagnostic imaging of breast: Secondary | ICD-10-CM

## 2022-03-14 ENCOUNTER — Telehealth: Payer: Self-pay | Admitting: Family Medicine

## 2022-03-14 MED ORDER — LISINOPRIL 10 MG PO TABS: 10.0000 mg | ORAL_TABLET | Freq: Every day | ORAL | 0 refills | Status: DC

## 2022-03-14 NOTE — Telephone Encounter (Signed)
Patient is aware and verbalized understanding.

## 2022-03-14 NOTE — Telephone Encounter (Signed)
Chart supports rx. Last OV: 12/23/2021 Next OV: 03/28/2022

## 2022-03-14 NOTE — Telephone Encounter (Signed)
Caller Name: Tyeisha Call back phone #: [phone]   MEDICATION(S):  lisinopril (ZESTRIL) 10 MG tablet  Days of Med Remaining: out of meds - last dose taken on Saturday 03/12/2022  Has the patient contacted their pharmacy (YES/NO)? Yes - last filled by hospitalist  Preferred Pharmacy:  [id number] - [place], [place] [phone]

## 2022-03-28 ENCOUNTER — Encounter: Payer: Self-pay | Admitting: Family Medicine

## 2022-03-28 ENCOUNTER — Ambulatory Visit (INDEPENDENT_AMBULATORY_CARE_PROVIDER_SITE_OTHER): Payer: 59 | Admitting: Family Medicine

## 2022-03-28 ENCOUNTER — Ambulatory Visit (INDEPENDENT_AMBULATORY_CARE_PROVIDER_SITE_OTHER): Payer: 59

## 2022-03-28 ENCOUNTER — Other Ambulatory Visit: Payer: Self-pay | Admitting: Family Medicine

## 2022-03-28 VITALS — BP 152/88 | HR 80 | Temp 97.2°F | Wt 162.8 lb

## 2022-03-28 DIAGNOSIS — M25562 Pain in left knee: Secondary | ICD-10-CM

## 2022-03-28 DIAGNOSIS — M545 Low back pain, unspecified: Secondary | ICD-10-CM | POA: Insufficient documentation

## 2022-03-28 DIAGNOSIS — G8929 Other chronic pain: Secondary | ICD-10-CM

## 2022-03-28 DIAGNOSIS — F4321 Adjustment disorder with depressed mood: Secondary | ICD-10-CM

## 2022-03-28 DIAGNOSIS — I10 Essential (primary) hypertension: Secondary | ICD-10-CM

## 2022-03-28 DIAGNOSIS — B351 Tinea unguium: Secondary | ICD-10-CM

## 2022-03-28 MED ORDER — DICLOFENAC SODIUM 1 % EX GEL: 4.0000 g | Freq: Four times a day (QID) | CUTANEOUS | 3 refills | Status: DC | PRN

## 2022-03-28 MED ORDER — LISINOPRIL 10 MG PO TABS
10.0000 mg | ORAL_TABLET | Freq: Two times a day (BID) | ORAL | 0 refills | Status: DC
Start: 2022-03-28 — End: 2022-04-21

## 2022-03-28 MED ORDER — CICLOPIROX 8 % EX KIT: 1.0000 | PACK | Freq: Every day | CUTANEOUS | 1 refills | Status: AC

## 2022-03-28 NOTE — Assessment & Plan Note (Signed)
Worsening, likely in the setting of grief We will increase lisinopril to 10 mg twice daily, per patient preference Follow-up in 1 month

## 2022-03-28 NOTE — Progress Notes (Unsigned)
Initial visit for left knew pain. Hx of left knee surgery. Not a recent injury. Pt states pain is around the entire knee cap

## 2022-03-28 NOTE — Patient Instructions (Signed)
For blood pressure, increase lisinopril as discussed.  For knee pain, we are getting xray. You may also try voltaren gel.  For nail fungus, try topical antifungal prescribed.

## 2022-03-28 NOTE — Assessment & Plan Note (Signed)
With onychogryphosis Trial of topical antifungal If no improvement, can consider referral to podiatry

## 2022-03-28 NOTE — Progress Notes (Signed)
Assessment/Plan:   Problem List Items Addressed This Visit       Cardiovascular and Mediastinum   Primary hypertension - Primary    Worsening, likely in the setting of grief We will increase lisinopril to 10 mg twice daily, per patient preference Follow-up in 1 month       Relevant Medications   lisinopril (ZESTRIL) 10 MG tablet     Musculoskeletal and Integument   Onychomycosis    With onychogryphosis Trial of topical antifungal If no improvement, can consider referral to podiatry      Relevant Medications   Ciclopirox 8 % KIT     Other   Chronic pain of left knee    Associated with remote injury Recommend against oral NSAIDs given worsening hypertension Continue Tylenol, add Voltaren  Get x-ray, If no improvement can consider physical therapy versus referral to orthopedics      Relevant Medications   diclofenac Sodium (VOLTAREN) 1 % GEL   Other Relevant Orders   DG Knee Complete 4 Views Left   Other Visit Diagnoses     Grief              Subjective:  HPI:  Barbara [name] is a 61 y.o. female who has GERD (gastroesophageal reflux disease); Sickle cell crisis ([place]); Abnormal uterine bleeding (AUB); Primary hypertension; Chronic pain of left knee; and Onychomycosis on their problem list..   She  has a past medical history of GERD (gastroesophageal reflux disease), Low iron, and Sickle cell anemia ([place])..   She presents with chief complaint of Follow-up (B/p follow up) .   Hypertension, established problem,  BP Readings from Last 3 Encounters:  03/28/22 (!) 152/88  01/03/22 132/80  12/23/21 136/86   Home BP monitoring: 180s over 100s Current Medications: Lisinopril 10 mg, compliant without side effects. Interim History: Reports worsening since recent the loss of her brother.  Reports compliance on lisinopril.  ROS: Denies any chest pain, shortness of breath, dyspnea on exertion, leg edema.   Knee Pain: Patient presents chronic pain involving the   left knee. Onset of the symptoms was several years ago. Inciting event: this is a longstanding problem which has been getting worse. Current symptoms include pain located lower knee . Pain is aggravated by going up and down stairs.  Patient has had prior knee problems with meniscus injury 30+ years ago. Evaluation to date: none. Treatment to date: ice and OTC analgesics which are somewhat effective. Knee hurts to go up and down stairs  Onychomycosis: Patient complains of abnormal appearing left toenails and difficulty wearing shoes due to foot pain.  Symptoms have been ongoing for about a few months, and include increasing thickness, darkening color, unsightliness, pain. Previous treatment has included none, Known liver disease? no.  Grief.  Occurred in setting of recent loss of her brother.  She is planning to get counseling associated with work.  Denies SI or HI      03/28/2022    4:08 PM  Depression screen PHQ 2/9  Decreased Interest 3  Down, Depressed, Hopeless 3  PHQ - 2 Score 6  Altered sleeping 3  Tired, decreased energy 3  Change in appetite 2  Feeling bad or failure about yourself  1  Trouble concentrating 1  Moving slowly or fidgety/restless 2  Suicidal thoughts 0  PHQ-9 Score 18  Difficult doing work/chores Somewhat difficult       03/28/2022    4:09 PM  GAD 7 : Generalized Anxiety Score  Nervous, Anxious,  on Edge 1  Control/stop worrying 2  Worry too much - different things 2  Trouble relaxing 2  Restless 1  Easily annoyed or irritable 3  Afraid - awful might happen 3  Total GAD 7 Score 14  Anxiety Difficulty Very difficult    ROS: No SI or HI.     Past Surgical History:  Procedure Laterality Date   ANTERIOR CRUCIATE LIGAMENT REPAIR     CHOLECYSTECTOMY     ENDOMETRIAL ABLATION     LEFT HEART CATHETERIZATION WITH CORONARY ANGIOGRAM N/A 08/27/2014   Procedure: LEFT HEART CATHETERIZATION WITH CORONARY ANGIOGRAM;  Surgeon: Wellington Hampshire, MD;  Location: [facility]  CATH LAB;  Service: Cardiovascular;  Laterality: N/A;    Outpatient Medications Prior to Visit  Medication Sig Dispense Refill   montelukast (SINGULAIR) 10 MG tablet Take by mouth.     pantoprazole (PROTONIX) 40 MG tablet 1 tablet     sucralfate (CARAFATE) 1 GM/10ML suspension Take 10 mLs (1 g total) by mouth 4 (four) times daily -  with meals and at bedtime. (Patient taking differently: Take 1 g by mouth 4 (four) times daily -  with meals and at bedtime. Patient reports PRN) 420 mL 0   lisinopril (ZESTRIL) 10 MG tablet Take 1 tablet (10 mg total) by mouth daily. 30 tablet 0   No facility-administered medications prior to visit.    Family History  Problem Relation Age of Onset   CAD Brother 3       CABG at age 36, and deceased   CAD Sister 20       1 stent   CAD Father 85       Deceased in 31s    Social History   Socioeconomic History   Marital status: Divorced    Spouse name: Not on file   Number of children: 2   Years of education: Not on file   Highest education level: Not on file  Occupational History   Not on file  Tobacco Use   Smoking status: Former    Years: 1.00    Types: Cigarettes    Quit date: 08/26/2013    Years since quitting: 8.5   Smokeless tobacco: Never  Vaping Use   Vaping Use: Never used  Substance and Sexual Activity   Alcohol use: Yes    Comment: occasionally, glass of wine   Drug use: No   Sexual activity: Not on file  Other Topics Concern   Not on file  Social History Narrative   Not on file   Social Determinants of Health   Financial Resource Strain: Not on file  Food Insecurity: Not on file  Transportation Needs: Not on file  Physical Activity: Not on file  Stress: Not on file  Social Connections: Not on file  Intimate Partner Violence: Not on file                                                                                                 Objective:  Physical Exam: BP (!) 152/88 (BP Location: Left Arm, Patient Position:  Sitting, Cuff Size: Large)  Pulse 80   Temp (!) 97.2 F (36.2 C) (Temporal)   Wt 162 lb 12.8 oz (73.8 kg)   SpO2 99%   BMI 24.39 kg/m    General: No acute distress. Awake and conversant.  Eyes: Normal conjunctiva, anicteric. Round symmetric pupils.  ENT: Hearing grossly intact. No nasal discharge.  Neck: Neck is supple. No masses or thyromegaly.  Respiratory: Respirations are non-labored. No auditory wheezing.  Skin: Yellowing and disfiguring of first and second nail of left foot Psych: Alert and oriented. Cooperative, Appropriate mood and affect, Normal judgment.  CV: No cyanosis or JVD MSK: Normal ambulation. No clubbing.  Left knee with normal range of motion, nontender to palpation, able to bear weight, no swelling or effusion, stable joint without laxity,  Neuro: Sensation and CN II-XII grossly normal.        Alesia Banda, MD, MS

## 2022-03-28 NOTE — Assessment & Plan Note (Signed)
Associated with remote injury Recommend against oral NSAIDs given worsening hypertension Continue Tylenol, add Voltaren  Get x-ray, If no improvement can consider physical therapy versus referral to orthopedics

## 2022-03-29 ENCOUNTER — Other Ambulatory Visit (HOSPITAL_COMMUNITY): Payer: Self-pay

## 2022-03-29 ENCOUNTER — Telehealth: Payer: Self-pay

## 2022-03-29 NOTE — Telephone Encounter (Signed)
Patient Advocate Encounter   Received notification from [employer] that prior authorization is required for Diclofenac Sodium 1% gel. PA submitted and APPROVED on 03/29/2022.  Key [id number] Effective: 02/27/2022 - 03/29/2023  MAX QTY SUPPLY OF 500 PER 021 DAY PERIOD MAXIMUM DAY SUPPLY OF 030 ALLOWED

## 2022-03-29 NOTE — Telephone Encounter (Signed)
Patient was advised that diclofenac sodium gel was denied by insurance , but OTC Voltaren can be used as well per Dr. Grandville Silos.

## 2022-04-21 ENCOUNTER — Other Ambulatory Visit: Payer: Self-pay | Admitting: Family Medicine

## 2022-04-21 DIAGNOSIS — I10 Essential (primary) hypertension: Secondary | ICD-10-CM

## 2022-04-28 ENCOUNTER — Telehealth (INDEPENDENT_AMBULATORY_CARE_PROVIDER_SITE_OTHER): Payer: 59 | Admitting: Family Medicine

## 2022-04-28 VITALS — Wt 158.0 lb

## 2022-04-28 DIAGNOSIS — I10 Essential (primary) hypertension: Secondary | ICD-10-CM | POA: Diagnosis not present

## 2022-04-28 DIAGNOSIS — K219 Gastro-esophageal reflux disease without esophagitis: Secondary | ICD-10-CM

## 2022-04-28 MED ORDER — FAMOTIDINE 40 MG PO TABS: 40.0000 mg | ORAL_TABLET | Freq: Every day | ORAL | 0 refills | Status: DC

## 2022-04-28 MED ORDER — HYDROCHLOROTHIAZIDE 25 MG PO TABS: 25.0000 mg | ORAL_TABLET | Freq: Every day | ORAL | 3 refills | Status: DC

## 2022-04-28 NOTE — Patient Instructions (Signed)
Start hydrochlorothiazide with lisinopril  Start Pepcid at night for heart burn

## 2022-04-28 NOTE — Progress Notes (Signed)
Assessment/Plan:   Problem List Items Addressed This Visit       Cardiovascular and Mediastinum   Primary hypertension - Primary   Relevant Medications   famotidine (PEPCID) 40 MG tablet   hydrochlorothiazide (HYDRODIURIL) 25 MG tablet   Other Relevant Orders   TSH   Lipid panel   Hemoglobin A1c   Microalbumin / creatinine urine ratio   Urinalysis, Routine w reflex microscopic   CBC with Differential/Platelet   Comprehensive metabolic panel     Digestive   GERD (gastroesophageal reflux disease)   Relevant Medications   famotidine (PEPCID) 40 MG tablet       Subjective:  HPI:  Marijke Guadiana is a 61 y.o. female who has GERD (gastroesophageal reflux disease); Sickle cell crisis ([place]); Abnormal uterine bleeding (AUB); Primary hypertension; Chronic pain of left knee; and Onychomycosis on their problem list..   She  has a past medical history of GERD (gastroesophageal reflux disease), Low iron, and Sickle cell anemia ([place])..   She presents with chief complaint of Follow-up (B/p has been running 159/96;162/94; Achy Pain in chest x 2 weeks) .    GERD, Follow up:  The patient was last seen for GERD several years ago. Changes made since that visit include on Protonix.  She reports good compliance with treatment. She is not having side effects.).  She IS experiencing midespigastric pain. She is NOT experiencing abdominal bloating, nausea, or need to clear throat frequently  -----------------------------------------------------------------------------------------  Hypertension, established problem,  BP Readings from Last 3 Encounters:  03/28/22 (!) 152/88  01/03/22 132/80  12/23/21 136/86     Current Medications: Lisinopril, compliant without side effects. Interim History: None  ROS: Denies shortness of breath, dyspnea on exertion, leg edema.       Past Surgical History:  Procedure Laterality Date   ANTERIOR CRUCIATE LIGAMENT REPAIR     CHOLECYSTECTOMY      ENDOMETRIAL ABLATION     LEFT HEART CATHETERIZATION WITH CORONARY ANGIOGRAM N/A 08/27/2014   Procedure: LEFT HEART CATHETERIZATION WITH CORONARY ANGIOGRAM;  Surgeon: Wellington Hampshire, MD;  Location: [facility] CATH LAB;  Service: Cardiovascular;  Laterality: N/A;    Outpatient Medications Prior to Visit  Medication Sig Dispense Refill   Ciclopirox 8 % KIT Apply 1 Application topically daily. 1 mL 1   diclofenac Sodium (VOLTAREN) 1 % GEL Apply 4 g topically 4 (four) times daily as needed. 100 g 3   lisinopril (ZESTRIL) 10 MG tablet TAKE 1 TABLET BY MOUTH TWICE A DAY 180 tablet 1   montelukast (SINGULAIR) 10 MG tablet Take by mouth.     pantoprazole (PROTONIX) 40 MG tablet 1 tablet     sucralfate (CARAFATE) 1 GM/10ML suspension Take 10 mLs (1 g total) by mouth 4 (four) times daily -  with meals and at bedtime. (Patient taking differently: Take 1 g by mouth 4 (four) times daily -  with meals and at bedtime. Patient reports PRN) 420 mL 0   No facility-administered medications prior to visit.    Family History  Problem Relation Age of Onset   CAD Brother 103       CABG at age 15, and deceased   CAD Sister 32       1 stent   CAD Father 15       Deceased in 2s    Social History   Socioeconomic History   Marital status: Divorced    Spouse name: Not on file   Number of children: 2   Years  of education: Not on file   Highest education level: Not on file  Occupational History   Not on file  Tobacco Use   Smoking status: Former    Years: 1.00    Types: Cigarettes    Quit date: 08/26/2013    Years since quitting: 8.6   Smokeless tobacco: Never  Vaping Use   Vaping Use: Never used  Substance and Sexual Activity   Alcohol use: Yes    Comment: occasionally, glass of wine   Drug use: No   Sexual activity: Not on file  Other Topics Concern   Not on file  Social History Narrative   Not on file   Social Determinants of Health   Financial Resource Strain: Not on file  Food Insecurity:  Not on file  Transportation Needs: Not on file  Physical Activity: Not on file  Stress: Not on file  Social Connections: Not on file  Intimate Partner Violence: Not on file                                                                                                 Objective:  Physical Exam: Wt 158 lb (71.7 kg) Comment: Pt reported  BMI 23.67 kg/m    General: No acute distress. Awake and conversant.  Eyes: Normal conjunctiva, anicteric. Round symmetric pupils.  ENT: Hearing grossly intact. No nasal discharge.  Neck: Neck is supple. No masses or thyromegaly.  Respiratory: Respirations are non-labored. No auditory wheezing.  Skin: Warm. No rashes or ulcers.  Psych: Alert and oriented. Cooperative, Appropriate mood and affect, Normal judgment.  CV: No cyanosis or JVD ABD: Nontender nondistended MSK: Normal ambulation. No clubbing  Neuro: Sensation and CN II-XII grossly normal.        Alesia Banda, MD, MS

## 2022-05-06 ENCOUNTER — Telehealth: Payer: Self-pay | Admitting: Family Medicine

## 2022-05-06 NOTE — Telephone Encounter (Signed)
Caller Name: Katelyn Broadnax Call back phone #: [phone]  Reason for Call: PT called in asking to speak to a nurse. She said that she was in a lot of pain and gelt like she was going into a "crisis". Transferred her to nurse triage

## 2022-05-22 ENCOUNTER — Other Ambulatory Visit: Payer: Self-pay | Admitting: Family Medicine

## 2022-05-22 DIAGNOSIS — K219 Gastro-esophageal reflux disease without esophagitis: Secondary | ICD-10-CM

## 2022-05-22 DIAGNOSIS — I10 Essential (primary) hypertension: Secondary | ICD-10-CM

## 2022-05-31 ENCOUNTER — Other Ambulatory Visit (INDEPENDENT_AMBULATORY_CARE_PROVIDER_SITE_OTHER): Payer: 59

## 2022-05-31 DIAGNOSIS — I10 Essential (primary) hypertension: Secondary | ICD-10-CM

## 2022-05-31 DIAGNOSIS — N1831 Chronic kidney disease, stage 3a: Secondary | ICD-10-CM

## 2022-05-31 DIAGNOSIS — E782 Mixed hyperlipidemia: Secondary | ICD-10-CM

## 2022-05-31 DIAGNOSIS — D571 Sickle-cell disease without crisis: Secondary | ICD-10-CM

## 2022-05-31 LAB — COMPREHENSIVE METABOLIC PANEL
ALT: 22 U/L (ref 0–35)
AST: 19 U/L (ref 0–37)
Albumin: 4.4 g/dL (ref 3.5–5.2)
Alkaline Phosphatase: 85 U/L (ref 39–117)
BUN: 26 mg/dL — ABNORMAL HIGH (ref 6–23)
CO2: 28 mEq/L (ref 19–32)
Calcium: 9.5 mg/dL (ref 8.4–10.5)
Chloride: 101 mEq/L (ref 96–112)
Creatinine, Ser: 1.13 mg/dL (ref 0.40–1.20)
GFR: 52.83 mL/min — ABNORMAL LOW (ref >60.00)
Glucose, Bld: 99 mg/dL (ref 70–99)
Potassium: 3.8 mEq/L (ref 3.5–5.1)
Sodium: 139 mEq/L (ref 135–145)
Total Bilirubin: 0.4 mg/dL (ref 0.2–1.2)
Total Protein: 7.2 g/dL (ref 6.0–8.3)

## 2022-05-31 LAB — CBC WITH DIFFERENTIAL/PLATELET
Basophils Absolute: 0 10*3/uL (ref 0.0–0.1)
Basophils Relative: 0.4 % (ref 0.0–3.0)
Eosinophils Absolute: 0 10*3/uL (ref 0.0–0.7)
Eosinophils Relative: 0.8 % (ref 0.0–5.0)
HCT: 32 % — ABNORMAL LOW (ref 36.0–46.0)
Hemoglobin: 11 g/dL — ABNORMAL LOW (ref 12.0–15.0)
Lymphocytes Relative: 34.2 % (ref 12.0–46.0)
Lymphs Abs: 2.1 10*3/uL (ref 0.7–4.0)
MCHC: 34.3 g/dL (ref 30.0–36.0)
MCV: 85.3 fl (ref 78.0–100.0)
Monocytes Absolute: 0.4 10*3/uL (ref 0.1–1.0)
Monocytes Relative: 6.4 % (ref 3.0–12.0)
Neutro Abs: 3.6 10*3/uL (ref 1.4–7.7)
Neutrophils Relative %: 58.2 % (ref 43.0–77.0)
Platelets: 278 10*3/uL (ref 150.0–400.0)
RBC: 3.74 Mil/uL — ABNORMAL LOW (ref 3.87–5.11)
RDW: 14.3 % (ref 11.5–15.5)
WBC: 6.2 10*3/uL (ref 4.0–10.5)

## 2022-05-31 LAB — URINALYSIS, ROUTINE W REFLEX MICROSCOPIC
Bilirubin Urine: NEGATIVE
Hgb urine dipstick: NEGATIVE
Ketones, ur: NEGATIVE
Leukocytes,Ua: NEGATIVE
Nitrite: NEGATIVE
RBC / HPF: NONE SEEN (ref 0–?)
Specific Gravity, Urine: 1.02 (ref 1.000–1.030)
Total Protein, Urine: NEGATIVE
Urine Glucose: NEGATIVE
Urobilinogen, UA: 0.2 (ref 0.0–1.0)
pH: 6 (ref 5.0–8.0)

## 2022-05-31 LAB — LIPID PANEL
Cholesterol: 222 mg/dL — ABNORMAL HIGH (ref 0–200)
HDL: 71.7 mg/dL (ref >39.00)
LDL Cholesterol: 133 mg/dL — ABNORMAL HIGH (ref 0–99)
NonHDL: 149.9
Total CHOL/HDL Ratio: 3
Triglycerides: 85 mg/dL (ref 0.0–149.0)
VLDL: 17 mg/dL (ref 0.0–40.0)

## 2022-05-31 LAB — MICROALBUMIN / CREATININE URINE RATIO
Creatinine,U: 163.2 mg/dL
Microalb Creat Ratio: 0.5 mg/g (ref 0.0–30.0)
Microalb, Ur: 0.9 mg/dL (ref 0.0–1.9)

## 2022-05-31 LAB — TSH: TSH: 1.49 u[IU]/mL (ref 0.35–5.50)

## 2022-05-31 LAB — HEMOGLOBIN A1C: Hgb A1c MFr Bld: 6.2 % (ref 4.6–6.5)

## 2022-05-31 MED ORDER — ROSUVASTATIN CALCIUM 10 MG PO TABS: 10.0000 mg | ORAL_TABLET | Freq: Every day | ORAL | 3 refills | Status: DC

## 2022-05-31 NOTE — Addendum Note (Signed)
Addended by: Josephine Igo B on: 05/31/2022 05:31 PM   Modules accepted: Orders

## 2022-06-28 ENCOUNTER — Other Ambulatory Visit (INDEPENDENT_AMBULATORY_CARE_PROVIDER_SITE_OTHER): Payer: 59

## 2022-06-28 ENCOUNTER — Telehealth: Payer: Self-pay | Admitting: Family Medicine

## 2022-06-28 DIAGNOSIS — D649 Anemia, unspecified: Secondary | ICD-10-CM | POA: Diagnosis not present

## 2022-06-28 DIAGNOSIS — I10 Essential (primary) hypertension: Secondary | ICD-10-CM

## 2022-06-28 LAB — COMPREHENSIVE METABOLIC PANEL
ALT: 11 U/L (ref 0–35)
AST: 14 U/L (ref 0–37)
Albumin: 4.4 g/dL (ref 3.5–5.2)
Alkaline Phosphatase: 70 U/L (ref 39–117)
BUN: 22 mg/dL (ref 6–23)
CO2: 24 mEq/L (ref 19–32)
Calcium: 9 mg/dL (ref 8.4–10.5)
Chloride: 103 mEq/L (ref 96–112)
Creatinine, Ser: 0.94 mg/dL (ref 0.40–1.20)
GFR: 65.86 mL/min (ref >60.00)
Glucose, Bld: 91 mg/dL (ref 70–99)
Potassium: 4.1 mEq/L (ref 3.5–5.1)
Sodium: 139 mEq/L (ref 135–145)
Total Bilirubin: 0.2 mg/dL (ref 0.2–1.2)
Total Protein: 7 g/dL (ref 6.0–8.3)

## 2022-06-28 LAB — CBC WITH DIFFERENTIAL/PLATELET
Basophils Absolute: 0 10*3/uL (ref 0.0–0.1)
Basophils Relative: 0.4 % (ref 0.0–3.0)
Eosinophils Absolute: 0 10*3/uL (ref 0.0–0.7)
Eosinophils Relative: 0.7 % (ref 0.0–5.0)
HCT: 28.2 % — ABNORMAL LOW (ref 36.0–46.0)
Hemoglobin: 9.8 g/dL — ABNORMAL LOW (ref 12.0–15.0)
Lymphocytes Relative: 35.5 % (ref 12.0–46.0)
Lymphs Abs: 2.2 10*3/uL (ref 0.7–4.0)
MCHC: 34.6 g/dL (ref 30.0–36.0)
MCV: 85.6 fl (ref 78.0–100.0)
Monocytes Absolute: 0.5 10*3/uL (ref 0.1–1.0)
Monocytes Relative: 7.5 % (ref 3.0–12.0)
Neutro Abs: 3.5 10*3/uL (ref 1.4–7.7)
Neutrophils Relative %: 55.9 % (ref 43.0–77.0)
Platelets: 246 10*3/uL (ref 150.0–400.0)
RBC: 3.3 Mil/uL — ABNORMAL LOW (ref 3.87–5.11)
RDW: 14.2 % (ref 11.5–15.5)
WBC: 6.2 10*3/uL (ref 4.0–10.5)

## 2022-06-28 LAB — URINALYSIS, ROUTINE W REFLEX MICROSCOPIC
Bilirubin Urine: NEGATIVE
Hgb urine dipstick: NEGATIVE
Leukocytes,Ua: NEGATIVE
Nitrite: NEGATIVE
RBC / HPF: NONE SEEN (ref 0–?)
Specific Gravity, Urine: 1.025 (ref 1.000–1.030)
Total Protein, Urine: NEGATIVE
Urine Glucose: NEGATIVE
Urobilinogen, UA: 0.2 (ref 0.0–1.0)
pH: 6 (ref 5.0–8.0)

## 2022-06-28 LAB — MICROALBUMIN / CREATININE URINE RATIO
Creatinine,U: 238.5 mg/dL
Microalb Creat Ratio: 0.7 mg/g (ref 0.0–30.0)
Microalb, Ur: 1.6 mg/dL (ref 0.0–1.9)

## 2022-06-28 LAB — LIPID PANEL
Cholesterol: 164 mg/dL (ref 0–200)
HDL: 85.1 mg/dL (ref >39.00)
LDL Cholesterol: 62 mg/dL (ref 0–99)
NonHDL: 78.65
Total CHOL/HDL Ratio: 2
Triglycerides: 84 mg/dL (ref 0.0–149.0)
VLDL: 16.8 mg/dL (ref 0.0–40.0)

## 2022-06-28 LAB — FERRITIN: Ferritin: 177.3 ng/mL (ref 10.0–291.0)

## 2022-06-28 LAB — VITAMIN B12: Vitamin B-12: 347 pg/mL (ref 211–911)

## 2022-06-28 LAB — HEMOGLOBIN A1C: Hgb A1c MFr Bld: 6 % (ref 4.6–6.5)

## 2022-06-28 LAB — FOLATE: Folate: 6.8 ng/mL (ref >5.9)

## 2022-06-28 LAB — TSH: TSH: 2.91 u[IU]/mL (ref 0.35–5.50)

## 2022-06-28 NOTE — Telephone Encounter (Signed)
Since patient just had an appointment today, when does she need to come back in for a follow up?

## 2022-06-28 NOTE — Telephone Encounter (Signed)
Patient scheduled on 07/11/2022 at 9 am  with PCP.

## 2022-06-29 ENCOUNTER — Other Ambulatory Visit: Payer: 59

## 2022-06-29 LAB — IRON AND TIBC
Iron Saturation: 25 % (ref 15–55)
Iron: 81 ug/dL (ref 27–159)
Total Iron Binding Capacity: 325 ug/dL (ref 250–450)
UIBC: 244 ug/dL (ref 131–425)

## 2022-07-11 ENCOUNTER — Encounter: Payer: Self-pay | Admitting: Family Medicine

## 2022-07-11 ENCOUNTER — Ambulatory Visit (INDEPENDENT_AMBULATORY_CARE_PROVIDER_SITE_OTHER): Payer: 59 | Admitting: Family Medicine

## 2022-07-11 VITALS — BP 134/86 | HR 84 | Temp 97.6°F | Wt 157.2 lb

## 2022-07-11 DIAGNOSIS — I10 Essential (primary) hypertension: Secondary | ICD-10-CM | POA: Diagnosis not present

## 2022-07-11 DIAGNOSIS — I7300 Raynaud's syndrome without gangrene: Secondary | ICD-10-CM

## 2022-07-11 LAB — SEDIMENTATION RATE: Sed Rate: 21 mm/hr (ref 0–30)

## 2022-07-11 LAB — C-REACTIVE PROTEIN: CRP: <1 mg/dL (ref 0.5–20.0)

## 2022-07-11 MED ORDER — AMLODIPINE BESYLATE 2.5 MG PO TABS: 2.5000 mg | ORAL_TABLET | Freq: Every day | ORAL | 0 refills | Status: DC

## 2022-07-11 MED ORDER — LISINOPRIL 10 MG PO TABS: 10.0000 mg | ORAL_TABLET | Freq: Every day | ORAL | 1 refills | Status: DC

## 2022-07-11 NOTE — Patient Instructions (Addendum)
Please reduce lisinopril to 10 mg daily, start amlodipine 2.5 mg daily, continue hydrochlorothiazide 25 mg daily.  Please monitor blood pressure daily, if remains less than 140/90, may follow-up in 2 months, if greater than this, please follow-up in clinic sooner.  We are getting labs and chest x-ray to evaluate for potential causes of Raynaud's phenomenon.  For chest xray xray, go to:    [facility], [place], [place], [place] [address] Phone: [phone]

## 2022-07-11 NOTE — Assessment & Plan Note (Signed)
The patient's hypertension is currently well-managed on Lisinopril and Hydrochlorothiazide. A modest reduction in Lisinopril to 10 mg once daily and initiation of Amlodipine 2.5 mg daily is made to improve Raynaud's symptoms while monitoring blood pressure. Differential diagnosis: Essential hypertension. Plan: Lower Lisinopril to 10 mg once daily. Initiate Amlodipine 2.5 mg daily to treat hypertension and Raynaud's symptoms. Continue Hydrochlorothiazide 25 mg daily. Monitor blood pressure and reassess in 2 months unless symptoms dictate an earlier evaluation.

## 2022-07-11 NOTE — Assessment & Plan Note (Signed)
The patient's symptoms are consistent with Raynaud's, particularly the pallor and numbness in extremities without exposure to cold temperatures. Differential diagnosis: Primary Raynaud's phenomenon, secondary to underlying disease (connective tissue disease or vasculitis). Plan: Counseling on keeping warm, especially during cold weather, and managing stress. Initiate Amlodipine for vasodilation to alleviate symptoms of Raynaud's. Perform additional laboratory testing, including CRP, ESR, and ANA with reflux, to rule out secondary causes. Obtain chest X-ray to evaluate any underlying pulmonary issues. Consider a follow-up with rheumatology for nailfoldNail-fold capillaroscopy especially if the patient has symptoms or laboratory indicators of connective tissue disease. The patient is scheduled to visit a hematologist; encourage discussion of Raynaud's symptoms at the visit.

## 2022-07-11 NOTE — Progress Notes (Signed)
Assessment/Plan:   Problem List Items Addressed This Visit       Cardiovascular and Mediastinum   Primary hypertension - Primary    The patient's hypertension is currently well-managed on Lisinopril and Hydrochlorothiazide. A modest reduction in Lisinopril to 10 mg once daily and initiation of Amlodipine 2.5 mg daily is made to improve Raynaud's symptoms while monitoring blood pressure. Differential diagnosis: Essential hypertension. Plan: Lower Lisinopril to 10 mg once daily. Initiate Amlodipine 2.5 mg daily to treat hypertension and Raynaud's symptoms. Continue Hydrochlorothiazide 25 mg daily. Monitor blood pressure and reassess in 2 months unless symptoms dictate an earlier evaluation.      Relevant Medications   amLODipine (NORVASC) 2.5 MG tablet   lisinopril (ZESTRIL) 10 MG tablet     Other   Raynaud's phenomenon without gangrene    The patient's symptoms are consistent with Raynaud's, particularly the pallor and numbness in extremities without exposure to cold temperatures. Differential diagnosis: Primary Raynaud's phenomenon, secondary to underlying disease (connective tissue disease or vasculitis). Plan: Counseling on keeping warm, especially during cold weather, and managing stress. Initiate Amlodipine for vasodilation to alleviate symptoms of Raynaud's. Perform additional laboratory testing, including CRP, ESR, and ANA with reflux, to rule out secondary causes. Obtain chest X-ray to evaluate any underlying pulmonary issues. Consider a follow-up with rheumatology for nailfoldNail-fold capillaroscopy especially if the patient has symptoms or laboratory indicators of connective tissue disease. The patient is scheduled to visit a hematologist; encourage discussion of Raynaud's symptoms at the visit.      Relevant Orders   Sedimentation rate   C-reactive protein   ANA w/Reflex   DG Chest 2 View    Medications Discontinued During This Encounter  Medication Reason    lisinopril (ZESTRIL) 10 MG tablet Reorder      Subjective:  HPI: Encounter date: 07/11/2022  Barbara [name] is a 62 y.o. female who has GERD (gastroesophageal reflux disease); Sickle cell crisis ([place]); Abnormal uterine bleeding (AUB); Primary hypertension; Chronic pain of left knee; Onychomycosis; and Raynaud's phenomenon without gangrene on their problem list..   She  has a past medical history of GERD (gastroesophageal reflux disease), Low iron, and Sickle cell anemia ([place])..   She presents with chief complaint of Medical Management of Chronic Issues (B/p 6 wk follow up) .   CHIEF COMPLAINT: The patient presents for a follow-up on blood pressure and reports symptoms consistent with Raynaud's phenomenon, predominantly postprandial.  HISTORY OF PRESENT ILLNESS:  Hypertension: The patient's blood pressure is well-controlled today at 134/84 mmHg. The current treatment includes Lisinopril 10 mg twice daily and Hydrochlorothiazide 25 mg daily.  Raynaud's Phenomenon: The patient reports experiencing cold, pale hands with numbness lasting about 20 minutes after eating. There is no associated pain, only discomfort. The symptoms are suggestive of Raynaud's phenomenon, exacerbated by cold exposure and possibly postprandial shifts. This has been occurring for approximately a month and a half, prompting the use of gloves to regain warmth. The patient denies associated chest pain, shortness of breath, swelling, fevers, chills, nausea, rashes, or trouble sleeping.  ROS: Cardiology:  Positive for well-controlled hypertension.  Neurology: Positive for numbness in fingers.  Integumentary: Positive for coldness in hands. All other ROS negative.  Past Surgical History:  Procedure Laterality Date   ANTERIOR CRUCIATE LIGAMENT REPAIR     CHOLECYSTECTOMY     ENDOMETRIAL ABLATION     LEFT HEART CATHETERIZATION WITH CORONARY ANGIOGRAM N/A 08/27/2014   Procedure: LEFT HEART CATHETERIZATION WITH CORONARY  ANGIOGRAM;  Surgeon: Wellington Hampshire,  MD;  Location: [facility];  Service: Cardiovascular;  Laterality: N/A;    Outpatient Medications Prior to Visit  Medication Sig Dispense Refill   diclofenac Sodium (VOLTAREN) 1 % GEL Apply 4 g topically 4 (four) times daily as needed. 100 g 3   famotidine (PEPCID) 40 MG tablet TAKE 1 TABLET BY MOUTH EVERYDAY AT BEDTIME 90 tablet 1   hydrochlorothiazide (HYDRODIURIL) 25 MG tablet Take 1 tablet (25 mg total) by mouth daily. 90 tablet 3   montelukast (SINGULAIR) 10 MG tablet Take by mouth.     pantoprazole (PROTONIX) 40 MG tablet 1 tablet     rosuvastatin (CRESTOR) 10 MG tablet Take 1 tablet (10 mg total) by mouth daily. 90 tablet 3   lisinopril (ZESTRIL) 10 MG tablet TAKE 1 TABLET BY MOUTH TWICE A DAY 180 tablet 1   No facility-administered medications prior to visit.    Family History  Problem Relation Age of Onset   CAD Brother 27       CABG at age 40, and deceased   CAD Sister 30       1 stent   CAD Father 23       Deceased in 74s    Social History   Socioeconomic History   Marital status: Divorced    Spouse name: Not on file   Number of children: 2   Years of education: Not on file   Highest education level: Not on file  Occupational History   Not on file  Tobacco Use   Smoking status: Former    Years: 1.00    Types: Cigarettes    Quit date: 08/26/2013    Years since quitting: 8.8   Smokeless tobacco: Never  Vaping Use   Vaping Use: Never used  Substance and Sexual Activity   Alcohol use: Yes    Comment: occasionally, glass of wine   Drug use: No   Sexual activity: Not on file  Other Topics Concern   Not on file  Social History Narrative   Not on file   Social Determinants of Health   Financial Resource Strain: Not on file  Food Insecurity: Not on file  Transportation Needs: Not on file  Physical Activity: Not on file  Stress: Not on file  Social Connections: Not on file  Intimate Partner Violence: Not on file                                                                                                  Objective:  Physical Exam: BP 134/86 (BP Location: Left Arm, Patient Position: Sitting, Cuff Size: Large)   Pulse 84   Temp 97.6 F (36.4 C) (Temporal)   Wt 157 lb 3.2 oz (71.3 kg)   SpO2 98%   BMI 23.55 kg/m    General: No acute distress. Awake and conversant.  Eyes: Normal conjunctiva, anicteric. Round symmetric pupils.  ENT: Hearing grossly intact. No nasal discharge.  Neck: Neck is supple. No masses or thyromegaly.  Respiratory: Respirations are non-labored. No auditory wheezing.  Skin: Warm. No rashes or ulcers.  Psych: Alert and oriented. Cooperative,  Appropriate mood and affect, Normal judgment.  CV: No cyanosis or JVD, cap refill less than 3 seconds, MSK: Hands without any blanching or signs of gangrene, negative Tinel's normal ambulation. No clubbing  Neuro: Sensation and CN II-XII grossly normal.   Labs: Patient with persistent anemia on recent CBC, follows up with hematology for Sickle Cell later this week.  Comprehensive metabolic panel, CBC with differential, urinalysis, HbA1c, lipid profile, TSH, vitamin B12, folate, iron studies, and ferritin were all within normal limits on 06/28/22.  Results for orders placed or performed in visit on 06/28/22  Comp Met (CMET)  Result Value Ref Range   Sodium 139 135 - 145 mEq/L   Potassium 4.1 3.5 - 5.1 mEq/L   Chloride 103 96 - 112 mEq/L   CO2 24 19 - 32 mEq/L   Glucose, Bld 91 70 - 99 mg/dL   BUN 22 6 - 23 mg/dL   Creatinine, Ser 0.94 0.40 - 1.20 mg/dL   Total Bilirubin 0.2 0.2 - 1.2 mg/dL   Alkaline Phosphatase 70 39 - 117 U/L   AST 14 0 - 37 U/L   ALT 11 0 - 35 U/L   Total Protein 7.0 6.0 - 8.3 g/dL   Albumin 4.4 3.5 - 5.2 g/dL   GFR 65.86 >60.00 mL/min   Calcium 9.0 8.4 - 10.5 mg/dL  CBC w/Diff  Result Value Ref Range   WBC 6.2 4.0 - 10.5 K/uL   RBC 3.30 (L) 3.87 - 5.11 Mil/uL   Hemoglobin 9.8 (L) 12.0 - 15.0 g/dL   HCT  28.2 (L) 36.0 - 46.0 %   MCV 85.6 78.0 - 100.0 fl   MCHC 34.6 30.0 - 36.0 g/dL   RDW 14.2 11.5 - 15.5 %   Platelets 246.0 150.0 - 400.0 K/uL   Neutrophils Relative % 55.9 43.0 - 77.0 %   Lymphocytes Relative 35.5 12.0 - 46.0 %   Monocytes Relative 7.5 3.0 - 12.0 %   Eosinophils Relative 0.7 0.0 - 5.0 %   Basophils Relative 0.4 0.0 - 3.0 %   Neutro Abs 3.5 1.4 - 7.7 K/uL   Lymphs Abs 2.2 0.7 - 4.0 K/uL   Monocytes Absolute 0.5 0.1 - 1.0 K/uL   Eosinophils Absolute 0.0 0.0 - 0.7 K/uL   Basophils Absolute 0.0 0.0 - 0.1 K/uL  Urine Microalbumin w/creat. ratio  Result Value Ref Range   Microalb, Ur 1.6 0.0 - 1.9 mg/dL   Creatinine,U 238.5 mg/dL   Microalb Creat Ratio 0.7 0.0 - 30.0 mg/g  Hemoglobin A1C  Result Value Ref Range   Hgb A1c MFr Bld 6.0 4.6 - 6.5 %  Lipid Profile  Result Value Ref Range   Cholesterol 164 0 - 200 mg/dL   Triglycerides 84.0 0.0 - 149.0 mg/dL   HDL 85.10 >39.00 mg/dL   VLDL 16.8 0.0 - 40.0 mg/dL   LDL Cholesterol 62 0 - 99 mg/dL   Total CHOL/HDL Ratio 2    NonHDL 78.65   TSH  Result Value Ref Range   TSH 2.91 0.35 - 5.50 uIU/mL  Urinalysis, Routine w reflex microscopic  Result Value Ref Range   Color, Urine YELLOW Yellow;Lt. Yellow;Straw;Dark Yellow;Amber;Green;Red;Brown   APPearance CLEAR Clear;Turbid;Slightly Cloudy;Cloudy   Specific Gravity, Urine 1.025 1.000 - 1.030   pH 6.0 5.0 - 8.0   Total Protein, Urine NEGATIVE Negative   Urine Glucose NEGATIVE Negative   Ketones, ur TRACE (A) Negative   Bilirubin Urine NEGATIVE Negative   Hgb urine dipstick NEGATIVE Negative  Urobilinogen, UA 0.2 0.0 - 1.0   Leukocytes,Ua NEGATIVE Negative   Nitrite NEGATIVE Negative   WBC, UA 0-2/hpf 0-2/hpf   RBC / HPF none seen 0-2/hpf   Mucus, UA Presence of (A) None   Squamous Epithelial / HPF Few(5-10/hpf) (A) Rare(0-4/hpf)  Vitamin B12  Result Value Ref Range   Vitamin B-12 347 211 - 911 pg/mL  Folate  Result Value Ref Range   Folate 6.8 >5.9 ng/mL  Iron  and TIBC  Result Value Ref Range   Total Iron Binding Capacity 325 250 - 450 ug/dL   UIBC 244 131 - 425 ug/dL   Iron 81 27 - 159 ug/dL   Iron Saturation 25 15 - 55 %  Ferritin  Result Value Ref Range   Ferritin 177.3 10.0 - 291.0 ng/mL        Alesia Banda, MD, MS

## 2022-07-12 ENCOUNTER — Ambulatory Visit (INDEPENDENT_AMBULATORY_CARE_PROVIDER_SITE_OTHER): Payer: 59 | Admitting: Family Medicine

## 2022-07-12 ENCOUNTER — Encounter: Payer: Self-pay | Admitting: Family Medicine

## 2022-07-12 VITALS — BP 120/73 | HR 97 | Temp 97.0°F | Ht 69.0 in | Wt 156.2 lb

## 2022-07-12 DIAGNOSIS — Z862 Personal history of diseases of the blood and blood-forming organs and certain disorders involving the immune mechanism: Secondary | ICD-10-CM

## 2022-07-12 LAB — ANA W/REFLEX: Anti Nuclear Antibody (ANA): NEGATIVE

## 2022-07-12 NOTE — Progress Notes (Signed)
Subjective   Patient ID: Barbara [name], female    DOB: [birth year]  Age: 62 y.o. MRN: [id number]  Chief Complaint  Patient presents with   Consult    From pcp    Barbara [name] is a very pleasant 62 year old female with a medical history significant for hypertension, anemia, and chronic pain presents with concerns of possible sickle cell disease.  Patient was referred by her PCP.  Patient says that she was told that she has sickle cell in the past.  She has never been under the care of hematologist.  She reports unexplained pain throughout her life.  Also, patient reports that when transitioning from higher elevations, she generally has increased pain. Ms. Grotte currently denies any current pain, blurred vision, urinary symptoms, nausea, vomiting, or diarrhea.  No abnormal bleeding.  Patient states that she has a menstrual cycle around every 6 months, she is perimenopausal.    Patient Active Problem List   Diagnosis Date Noted   Raynaud's phenomenon without gangrene 07/11/2022   Chronic pain of left knee 03/28/2022   Onychomycosis 03/28/2022   Primary hypertension 12/23/2021   Abnormal uterine bleeding (AUB) 11/25/2021   Sickle cell crisis ([place]) 10/27/2017   GERD (gastroesophageal reflux disease) 08/27/2014   Past Medical History:  Diagnosis Date   GERD (gastroesophageal reflux disease)    Low iron    Sickle cell anemia ([facility])    Past Surgical History:  Procedure Laterality Date   ANTERIOR CRUCIATE LIGAMENT REPAIR     CHOLECYSTECTOMY     ENDOMETRIAL ABLATION     LEFT HEART CATHETERIZATION WITH CORONARY ANGIOGRAM N/A 08/27/2014   Procedure: LEFT HEART CATHETERIZATION WITH CORONARY ANGIOGRAM;  Surgeon: Wellington Hampshire, MD;  Location: [facility];  Service: Cardiovascular;  Laterality: N/A;   Social History   Tobacco Use   Smoking status: Former    Years: 1.00    Types: Cigarettes    Quit date: 08/26/2013    Years since quitting: 8.8   Smokeless tobacco: Never  Vaping  Use   Vaping Use: Never used  Substance Use Topics   Alcohol use: Yes    Comment: occasionally, glass of wine   Drug use: No   Social History   Socioeconomic History   Marital status: Divorced    Spouse name: Not on file   Number of children: 2   Years of education: Not on file   Highest education level: Not on file  Occupational History   Not on file  Tobacco Use   Smoking status: Former    Years: 1.00    Types: Cigarettes    Quit date: 08/26/2013    Years since quitting: 8.8   Smokeless tobacco: Never  Vaping Use   Vaping Use: Never used  Substance and Sexual Activity   Alcohol use: Yes    Comment: occasionally, glass of wine   Drug use: No   Sexual activity: Not on file  Other Topics Concern   Not on file  Social History Narrative   Not on file   Social Determinants of Health   Financial Resource Strain: Not on file  Food Insecurity: Not on file  Transportation Needs: Not on file  Physical Activity: Not on file  Stress: Not on file  Social Connections: Not on file  Intimate Partner Violence: Not on file   Family Status  Relation Name Status   Brother  (Not Specified)   Sister  (Not Specified)   Father  (Not Specified)  Family History  Problem Relation Age of Onset   CAD Brother 20       CABG at age 22, and deceased   CAD Sister 72       1 stent   CAD Father 65       Deceased in 27s   Allergies  Allergen Reactions   Iodinated Contrast Media Anaphylaxis    States he face swells when she has CT iodinated contrast   Iodine Swelling    Other reaction(s): Facial swelling   Morphine And Related Itching   Other Swelling    All Nut Allergies   Sulfa Antibiotics Swelling   Sulfamethoxazole-Trimethoprim Other (See Comments)      Review of Systems  Constitutional: Negative.  Negative for chills, fever and weight loss.  HENT: Negative.    Respiratory: Negative.    Cardiovascular: Negative.   Gastrointestinal:  Negative for abdominal pain, blood in  stool, constipation, diarrhea, heartburn, melena, nausea and vomiting.  Genitourinary:  Negative for dysuria, flank pain, frequency, hematuria and urgency.  Musculoskeletal: Negative.  Negative for back pain and joint pain.  Skin: Negative.   Neurological: Negative.   Endo/Heme/Allergies: Negative.  Negative for environmental allergies and polydipsia. Does not bruise/bleed easily.  Psychiatric/Behavioral: Negative.        Objective:     BP 120/73   Pulse 97   Temp (!) 97 F (36.1 C)   Ht 5' 9"$  (1.753 m)   Wt 156 lb 3.2 oz (70.9 kg)   LMP 10/04/2021   SpO2 100%   BMI 23.07 kg/m  BP Readings from Last 3 Encounters:  07/12/22 120/73  07/11/22 134/86  03/28/22 (!) 152/88   Wt Readings from Last 3 Encounters:  07/12/22 156 lb 3.2 oz (70.9 kg)  07/11/22 157 lb 3.2 oz (71.3 kg)  04/28/22 158 lb (71.7 kg)      Physical Exam Constitutional:      Appearance: Normal appearance.  Eyes:     Pupils: Pupils are equal, round, and reactive to light.  Cardiovascular:     Rate and Rhythm: Normal rate and regular rhythm.     Pulses: Normal pulses.  Pulmonary:     Effort: Pulmonary effort is normal.  Abdominal:     General: Bowel sounds are normal.  Musculoskeletal:        General: Normal range of motion.  Skin:    General: Skin is warm.  Neurological:     General: No focal deficit present.     Mental Status: She is alert. Mental status is at baseline.  Psychiatric:        Mood and Affect: Mood normal.        Behavior: Behavior normal.        Thought Content: Thought content normal.        Judgment: Judgment normal.      No results found for any visits on 07/12/22.  Last CBC Lab Results  Component Value Date   WBC 6.2 06/28/2022   HGB 9.8 (L) 06/28/2022   HCT 28.2 (L) 06/28/2022   MCV 85.6 06/28/2022   MCH 29.0 12/13/2021   RDW 14.2 06/28/2022   PLT 246.0 0000000   Last metabolic panel Lab Results  Component Value Date   GLUCOSE 91 06/28/2022   NA 139  06/28/2022   K 4.1 06/28/2022   CL 103 06/28/2022   CO2 24 06/28/2022   BUN 22 06/28/2022   CREATININE 0.94 06/28/2022   GFRNONAA >60 12/13/2021   CALCIUM 9.0 06/28/2022  PROT 7.0 06/28/2022   ALBUMIN 4.4 06/28/2022   BILITOT 0.2 06/28/2022   ALKPHOS 70 06/28/2022   AST 14 06/28/2022   ALT 11 06/28/2022   ANIONGAP 7 12/13/2021   Last lipids Lab Results  Component Value Date   CHOL 164 06/28/2022   HDL 85.10 06/28/2022   LDLCALC 62 06/28/2022   TRIG 84.0 06/28/2022   CHOLHDL 2 06/28/2022   Last hemoglobin A1c Lab Results  Component Value Date   HGBA1C 6.0 06/28/2022   Last thyroid functions Lab Results  Component Value Date   TSH 2.91 06/28/2022   Last vitamin D No results found for: "25OHVITD2", "25OHVITD3", "VD25OH" Last vitamin B12 and Folate Lab Results  Component Value Date   [id number] 06/28/2022   FOLATE 6.8 06/28/2022      The 10-year ASCVD risk score (Arnett DK, et al., 2019) is: 3.7%    Assessment & Plan:   Problem List Items Addressed This Visit   None Visit Diagnoses     History of blood disorder    -  Primary   Relevant Orders   Hgb Fractionation Cascade      History of blood disorder Reviewed previous labs.  Anemia noted.  Hemoglobin 9.8 g/dL.  Recommend iron repletion.  Will follow-up by phone after reviewing hemoglobin fractionation. Recommend folate fortified foods.  Patient says that she has been unable to take folic acid in the past due to gastrointestinal issues. - Hgb Fractionation Cascade  Return if symptoms worsen or fail to improve.   Donia Pounds  APRN, MSN, FNP-C [facility] [address] [place], [place] [address] [phone]

## 2022-07-12 NOTE — Patient Instructions (Addendum)
It was a pleasure meeting you on today.  We will follow up with you by phone after reviewing your blood tests, which takes around 4 days to result.    Recommend that you add good sources of folate to your diet.  Folic acid 1 mg daily is recommended.  However, since you are unable to tolerate folic acid in the past, sources from your diet will be very important.  Good sources of folate include: Dark green leafy vegetables (turnip greens, spinach, romaine lettuce, asparagus, Brussels sprouts, broccoli) Beans. Peanuts. Sunflower seeds. Fresh fruits, fruit juices. Whole grains. Liver. Aquatic foods   Recommend yearly eye exam to screen for retinopathy  Recommend staying up-to-date with all required age associated vaccinations

## 2022-07-15 LAB — HGB FRACTIONATION CASCADE

## 2022-07-15 LAB — HGB FRACTIONATION BY HPLC
Hgb A2: 3.2 % (ref 1.8–3.2)
Hgb A: 62.8 % — ABNORMAL LOW (ref 96.4–98.8)
Hgb C: 34 % — ABNORMAL HIGH
Hgb E: 0 %
Hgb F: 0 % (ref 0.0–2.0)
Hgb S: 0 %
Hgb Variant: 0 %

## 2022-07-18 ENCOUNTER — Encounter (HOSPITAL_COMMUNITY): Payer: Self-pay

## 2022-07-18 ENCOUNTER — Other Ambulatory Visit: Payer: Self-pay

## 2022-07-18 ENCOUNTER — Emergency Department (HOSPITAL_COMMUNITY)
Admission: EM | Admit: 2022-07-18 | Discharge: 2022-07-18 | Disposition: A | Discharge status: Home / Self Care | Payer: 59 | Attending: Emergency Medicine | Admitting: Emergency Medicine

## 2022-07-18 ENCOUNTER — Telehealth: Payer: 59 | Admitting: Physician Assistant

## 2022-07-18 DIAGNOSIS — R109 Unspecified abdominal pain: Secondary | ICD-10-CM | POA: Insufficient documentation

## 2022-07-18 DIAGNOSIS — A084 Viral intestinal infection, unspecified: Secondary | ICD-10-CM

## 2022-07-18 DIAGNOSIS — D5700 Hb-SS disease with crisis, unspecified: Secondary | ICD-10-CM

## 2022-07-18 DIAGNOSIS — M545 Low back pain, unspecified: Secondary | ICD-10-CM | POA: Diagnosis not present

## 2022-07-18 DIAGNOSIS — R197 Diarrhea, unspecified: Secondary | ICD-10-CM | POA: Insufficient documentation

## 2022-07-18 DIAGNOSIS — Z79899 Other long term (current) drug therapy: Secondary | ICD-10-CM | POA: Diagnosis not present

## 2022-07-18 DIAGNOSIS — D72829 Elevated white blood cell count, unspecified: Secondary | ICD-10-CM | POA: Insufficient documentation

## 2022-07-18 DIAGNOSIS — R42 Dizziness and giddiness: Secondary | ICD-10-CM | POA: Insufficient documentation

## 2022-07-18 DIAGNOSIS — R112 Nausea with vomiting, unspecified: Secondary | ICD-10-CM | POA: Diagnosis not present

## 2022-07-18 HISTORY — DX: Essential (primary) hypertension: I10

## 2022-07-18 LAB — CBC WITH DIFFERENTIAL/PLATELET
Abs Immature Granulocytes: 0.01 10*3/uL (ref 0.00–0.07)
Basophils Absolute: 0 10*3/uL (ref 0.0–0.1)
Basophils Relative: 0 %
Eosinophils Absolute: 0 10*3/uL (ref 0.0–0.5)
Eosinophils Relative: 1 %
HCT: 30 % — ABNORMAL LOW (ref 36.0–46.0)
Hemoglobin: 10.4 g/dL — ABNORMAL LOW (ref 12.0–15.0)
Immature Granulocytes: 0 %
Lymphocytes Relative: 33 %
Lymphs Abs: 1.8 10*3/uL (ref 0.7–4.0)
MCH: 28.7 pg (ref 26.0–34.0)
MCHC: 34.7 g/dL (ref 30.0–36.0)
MCV: 82.9 fL (ref 80.0–100.0)
Monocytes Absolute: 0.5 10*3/uL (ref 0.1–1.0)
Monocytes Relative: 10 %
Neutro Abs: 3.1 10*3/uL (ref 1.7–7.7)
Neutrophils Relative %: 56 %
Platelets: 242 10*3/uL (ref 150–400)
RBC: 3.62 MIL/uL — ABNORMAL LOW (ref 3.87–5.11)
RDW: 13.4 % (ref 11.5–15.5)
WBC: 5.5 10*3/uL (ref 4.0–10.5)
nRBC: 0 % (ref 0.0–0.2)

## 2022-07-18 LAB — I-STAT BETA HCG BLOOD, ED (MC, WL, AP ONLY): I-stat hCG, quantitative: <5 m[IU]/mL (ref <5)

## 2022-07-18 LAB — RETICULOCYTES
Immature Retic Fract: 10.7 % (ref 2.3–15.9)
RBC.: 3.43 MIL/uL — ABNORMAL LOW (ref 3.87–5.11)
Retic Count, Absolute: 41.2 10*3/uL (ref 19.0–186.0)
Retic Ct Pct: 1.2 % (ref 0.4–3.1)

## 2022-07-18 LAB — COMPREHENSIVE METABOLIC PANEL
ALT: 20 U/L (ref 0–44)
AST: 24 U/L (ref 15–41)
Albumin: 3.7 g/dL (ref 3.5–5.0)
Alkaline Phosphatase: 72 U/L (ref 38–126)
Anion gap: 13 (ref 5–15)
BUN: 15 mg/dL (ref 6–20)
CO2: 23 mmol/L (ref 22–32)
Calcium: 8.6 mg/dL — ABNORMAL LOW (ref 8.9–10.3)
Chloride: 101 mmol/L (ref 98–111)
Creatinine, Ser: 0.98 mg/dL (ref 0.44–1.00)
GFR, Estimated: >60 mL/min (ref >60)
Glucose, Bld: 87 mg/dL (ref 70–99)
Potassium: 3.5 mmol/L (ref 3.5–5.1)
Sodium: 137 mmol/L (ref 135–145)
Total Bilirubin: 0.5 mg/dL (ref 0.3–1.2)
Total Protein: 7.5 g/dL (ref 6.5–8.1)

## 2022-07-18 MED ORDER — ONDANSETRON 4 MG PO TBDP: 4.0000 mg | ORAL_TABLET | Freq: Three times a day (TID) | ORAL | 0 refills | Status: DC | PRN

## 2022-07-18 MED ORDER — SODIUM CHLORIDE 0.9 % IV SOLN
25.0000 mg | Freq: Once | INTRAVENOUS | Status: AC
  Administered 2022-07-18: 25 mg via INTRAVENOUS
  Filled 2022-07-18: qty 25

## 2022-07-18 MED ORDER — ONDANSETRON 8 MG PO TBDP
8.0000 mg | ORAL_TABLET | Freq: Three times a day (TID) | ORAL | Status: DC | PRN
  Administered 2022-07-18: 8 mg via ORAL
  Filled 2022-07-18: qty 1

## 2022-07-18 MED ORDER — ONDANSETRON HCL 4 MG/2ML IJ SOLN
4.0000 mg | Freq: Once | INTRAMUSCULAR | Status: AC
  Administered 2022-07-18: 4 mg via INTRAVENOUS
  Filled 2022-07-18: qty 2

## 2022-07-18 MED ORDER — PROMETHAZINE HCL 25 MG RE SUPP: 25.0000 mg | Freq: Four times a day (QID) | RECTAL | 0 refills | Status: DC | PRN

## 2022-07-18 MED ORDER — DICYCLOMINE HCL 20 MG PO TABS: 20.0000 mg | ORAL_TABLET | Freq: Two times a day (BID) | ORAL | 0 refills | Status: DC

## 2022-07-18 MED ORDER — SODIUM CHLORIDE 0.9 % IV BOLUS
1000.0000 mL | Freq: Once | INTRAVENOUS | Status: AC
  Administered 2022-07-18: 1000 mL via INTRAVENOUS

## 2022-07-18 MED ORDER — FENTANYL CITRATE PF 50 MCG/ML IJ SOSY
50.0000 ug | PREFILLED_SYRINGE | Freq: Once | INTRAMUSCULAR | Status: AC
  Administered 2022-07-18: 50 ug via INTRAVENOUS
  Filled 2022-07-18: qty 1

## 2022-07-18 MED ORDER — OXYCODONE-ACETAMINOPHEN 5-325 MG PO TABS: 1.0000 | ORAL_TABLET | Freq: Four times a day (QID) | ORAL | 0 refills | Status: DC | PRN

## 2022-07-18 NOTE — ED Provider Triage Note (Signed)
Emergency Medicine Provider Triage Evaluation Note  Barbara [name] , a 62 y.o. female  was evaluated in triage.  Pt complains of unable to eat or drink since Friday due to vomiting and diarrhea. Back pain, feels like getting punched in the kidneys since Saturday evening, also shoulder pain, similar to prior [place] pain. Flew to [facility] for the weekend, stayed in the bed sick all weekend. Unable to keep meds down due to vomiting. Called virtual doc and called in Zofran but was unable to drive to pick it up. Called [place] NP who told her to come in for fluids.   Review of Systems  Positive: As above Negative: fever  Physical Exam  BP 128/84   Pulse 86   Temp 98.2 F (36.8 C) (Oral)   Resp 16   Ht '5\' 9"'$  (1.753 m)   Wt 70.8 kg   LMP 10/04/2021   SpO2 100%   BMI 23.04 kg/m  Gen:   Awake, no distress   Resp:  Normal effort  MSK:   Moves extremities without difficulty  Other:    Medical Decision Making  Medically screening exam initiated at 1:20 PM.  Appropriate orders placed.  Barbara [name] was informed that the remainder of the evaluation will be completed by another provider, this initial triage assessment does not replace that evaluation, and the importance of remaining in the ED until their evaluation is complete.     Tacy Learn, PA-C 07/18/22 1322

## 2022-07-18 NOTE — Progress Notes (Signed)
Virtual Visit Consent   Barbara [name], you are scheduled for a virtual visit with a [facility] provider today. Just as with appointments in the office, your consent must be obtained to participate. Your consent will be active for this visit and any virtual visit you may have with one of our providers in the next 365 days. If you have a MyChart account, a copy of this consent can be sent to you electronically.  As this is a virtual visit, video technology does not allow for your provider to perform a traditional examination. This may limit your provider's ability to fully assess your condition. If your provider identifies any concerns that need to be evaluated in person or the need to arrange testing (such as labs, EKG, etc.), we will make arrangements to do so. Although advances in technology are sophisticated, we cannot ensure that it will always work on either your end or our end. If the connection with a video visit is poor, the visit may have to be switched to a telephone visit. With either a video or telephone visit, we are not always able to ensure that we have a secure connection.  By engaging in this virtual visit, you consent to the provision of healthcare and authorize for your insurance to be billed (if applicable) for the services provided during this visit. Depending on your insurance coverage, you may receive a charge related to this service.  I need to obtain your verbal consent now. Are you willing to proceed with your visit today? Barbara [name] has provided verbal consent on 07/18/2022 for a virtual visit (video or telephone). Mar Daring, PA-C  Date: 07/18/2022 9:22 AM  Virtual Visit via Video Note   I, Mar Daring, connected with  Barbara [name]  ([id number], 1961-04-13) on 07/18/22 at  9:15 AM EST by a video-enabled telemedicine application and verified that I am speaking with the correct person using two identifiers.  Location: Patient: Virtual Visit Location  Patient: Home Provider: Virtual Visit Location Provider: Home Office   I discussed the limitations of evaluation and management by telemedicine and the availability of in person appointments. The patient expressed understanding and agreed to proceed.    History of Present Illness: Barbara [name] is a 62 y.o. who identifies as a female who was assigned female at birth, and is being seen today for stomach and back pain.  HPI: GI Problem The primary symptoms include fatigue, abdominal pain, nausea, vomiting, diarrhea (mild), myalgias and arthralgias. Primary symptoms do not include fever, weight loss, melena, hematemesis or hematochezia. The illness began 2 days ago (Saturday). The onset was sudden. The problem has not changed since onset. The illness is also significant for chills, anorexia, bloating (mild) and back pain. The illness does not include dysphagia or constipation.    Flew to [facility] on Thursday. Saturday symptoms started with stomach cramping. Since anytime she eats she feels like a brick in her stomach and vomits with anything she eats or drinks. Having some back pain and feeling of nerve pain in her legs.   Has tried Tylenol for pain.   Problems:  Patient Active Problem List   Diagnosis Date Noted   Raynaud's phenomenon without gangrene 07/11/2022   Chronic pain of left knee 03/28/2022   Onychomycosis 03/28/2022   Primary hypertension 12/23/2021   Abnormal uterine bleeding (AUB) 11/25/2021   Sickle cell crisis ([place]) 10/27/2017   GERD (gastroesophageal reflux disease) 08/27/2014    Allergies:  Allergies  Allergen Reactions  Iodinated Contrast Media Anaphylaxis    States he face swells when she has CT iodinated contrast   Iodine Swelling    Other reaction(s): Facial swelling   Morphine And Related Itching   Other Swelling    All Nut Allergies   Sulfa Antibiotics Swelling   Sulfamethoxazole-Trimethoprim Other (See Comments)   Medications:  Current Outpatient  Medications:    dicyclomine (BENTYL) 20 MG tablet, Take 1 tablet (20 mg total) by mouth in the morning and at bedtime., Disp: 20 tablet, Rfl: 0   ondansetron (ZOFRAN-ODT) 4 MG disintegrating tablet, Take 1 tablet (4 mg total) by mouth every 8 (eight) hours as needed., Disp: 20 tablet, Rfl: 0   amLODipine (NORVASC) 2.5 MG tablet, Take 1 tablet (2.5 mg total) by mouth daily., Disp: 90 tablet, Rfl: 0   diclofenac Sodium (VOLTAREN) 1 % GEL, Apply 4 g topically 4 (four) times daily as needed., Disp: 100 g, Rfl: 3   famotidine (PEPCID) 40 MG tablet, TAKE 1 TABLET BY MOUTH EVERYDAY AT BEDTIME, Disp: 90 tablet, Rfl: 1   hydrochlorothiazide (HYDRODIURIL) 25 MG tablet, Take 1 tablet (25 mg total) by mouth daily., Disp: 90 tablet, Rfl: 3   lisinopril (ZESTRIL) 10 MG tablet, Take 1 tablet (10 mg total) by mouth daily., Disp: 180 tablet, Rfl: 1   montelukast (SINGULAIR) 10 MG tablet, Take by mouth. (Patient not taking: Reported on 07/12/2022), Disp: , Rfl:    pantoprazole (PROTONIX) 40 MG tablet, 1 tablet, Disp: , Rfl:    rosuvastatin (CRESTOR) 10 MG tablet, Take 1 tablet (10 mg total) by mouth daily., Disp: 90 tablet, Rfl: 3   Observations/Objective: Patient is well-developed, well-nourished in no acute distress.  Resting comfortably at home.  Head is normocephalic, atraumatic.  No labored breathing.  Speech is clear and coherent with logical content.  Patient is alert and oriented at baseline.    Assessment and Plan: 1. Viral gastroenteritis - ondansetron (ZOFRAN-ODT) 4 MG disintegrating tablet; Take 1 tablet (4 mg total) by mouth every 8 (eight) hours as needed.  Dispense: 20 tablet; Refill: 0 - dicyclomine (BENTYL) 20 MG tablet; Take 1 tablet (20 mg total) by mouth in the morning and at bedtime.  Dispense: 20 tablet; Refill: 0  - Suspect viral gastroenteritis - Zofran for nausea - Bentyl for cramping - Push fluids, electrolyte beverages - Liquid diet, then increase to soft/bland (BRAT) diet over  next day, then increase diet as tolerated - Seek in person evaluation if not improving or symptoms worsen   Follow Up Instructions: I discussed the assessment and treatment plan with the patient. The patient was provided an opportunity to ask questions and all were answered. The patient agreed with the plan and demonstrated an understanding of the instructions.  A copy of instructions were sent to the patient via MyChart unless otherwise noted below.    The patient was advised to call back or seek an in-person evaluation if the symptoms worsen or if the condition fails to improve as anticipated.  Time:  I spent 10 minutes with the patient via telehealth technology discussing the above problems/concerns.    Mar Daring, PA-C

## 2022-07-18 NOTE — ED Triage Notes (Signed)
N/V since Saturday. Sickle cell pt that states she feels like she is having a flare up and has some lower back pain. C/o dizziness and headache. Pt states she has thrown up all of her home meds.

## 2022-07-18 NOTE — Discharge Instructions (Signed)
As we discussed, your workup in the ER today was reassuring for acute findings.  I suspect that you have gastroenteritis.  I have given you a prescription for a Phenergan suppository for you to use as needed for nausea and vomiting.  Have also given you a prescription for Percocet which is a narcotic pain medication for you to take as prescribed as needed for management of severe pain from your sickle cell crisis.  Do not drive or operate heavy machinery while taking this medication as it can be sedating.  Follow-up with your primary care doctor in the next few days for follow-up.  Return if development of any new or worsening symptoms.

## 2022-07-18 NOTE — Patient Instructions (Signed)
Shellee Milo, thank you for joining Mar Daring, PA-C for today's virtual visit.  While this provider is not your primary care provider (PCP), if your PCP is located in our provider database this encounter information will be shared with them immediately following your visit.   [facility] account gives you access to today's visit and all your visits, tests, and labs performed at [facility] " click here if you don't have a [facility] account or go to [email or web address]  Consent: (Patient) Barbara [name] provided verbal consent for this virtual visit at the beginning of the encounter.  Current Medications:  Current Outpatient Medications:    dicyclomine (BENTYL) 20 MG tablet, Take 1 tablet (20 mg total) by mouth in the morning and at bedtime., Disp: 20 tablet, Rfl: 0   ondansetron (ZOFRAN-ODT) 4 MG disintegrating tablet, Take 1 tablet (4 mg total) by mouth every 8 (eight) hours as needed., Disp: 20 tablet, Rfl: 0   amLODipine (NORVASC) 2.5 MG tablet, Take 1 tablet (2.5 mg total) by mouth daily., Disp: 90 tablet, Rfl: 0   diclofenac Sodium (VOLTAREN) 1 % GEL, Apply 4 g topically 4 (four) times daily as needed., Disp: 100 g, Rfl: 3   famotidine (PEPCID) 40 MG tablet, TAKE 1 TABLET BY MOUTH EVERYDAY AT BEDTIME, Disp: 90 tablet, Rfl: 1   hydrochlorothiazide (HYDRODIURIL) 25 MG tablet, Take 1 tablet (25 mg total) by mouth daily., Disp: 90 tablet, Rfl: 3   lisinopril (ZESTRIL) 10 MG tablet, Take 1 tablet (10 mg total) by mouth daily., Disp: 180 tablet, Rfl: 1   montelukast (SINGULAIR) 10 MG tablet, Take by mouth. (Patient not taking: Reported on 07/12/2022), Disp: , Rfl:    pantoprazole (PROTONIX) 40 MG tablet, 1 tablet, Disp: , Rfl:    rosuvastatin (CRESTOR) 10 MG tablet, Take 1 tablet (10 mg total) by mouth daily., Disp: 90 tablet, Rfl: 3   Medications ordered in this encounter:  Meds ordered this encounter  Medications   ondansetron (ZOFRAN-ODT) 4  MG disintegrating tablet    Sig: Take 1 tablet (4 mg total) by mouth every 8 (eight) hours as needed.    Dispense:  20 tablet    Refill:  0    Order Specific Question:   Supervising Provider    Answer:   Chase Picket [id number]   dicyclomine (BENTYL) 20 MG tablet    Sig: Take 1 tablet (20 mg total) by mouth in the morning and at bedtime.    Dispense:  20 tablet    Refill:  0    Order Specific Question:   Supervising Provider    Answer:   Chase Picket [id number]     *If you need refills on other medications prior to your next appointment, please contact your pharmacy*  Follow-Up: Call back or seek an in-person evaluation if the symptoms worsen or if the condition fails to improve as anticipated.  [facility] [phone]  Other Instructions  Viral Gastroenteritis, Adult  Viral gastroenteritis is also known as the stomach flu. This condition may affect your stomach, your small intestine, and your large intestine. It can cause sudden watery poop (diarrhea), fever, and vomiting. This condition is caused by certain germs (viruses). These germs can be passed from person to person very easily (are contagious). Having watery poop and vomiting can make you feel weak and cause you to not have enough water in your body (get dehydrated). This can make you tired and thirsty, make you have  a dry mouth, and make it so you pee (urinate) less often. It is important to replace the fluids that you lose from having watery poop and vomiting. What are the causes? You can get sick by catching germs from other people. You can also get sick by: Eating food, drinking water, or touching a surface that has the germs on it (is contaminated). Sharing utensils or other personal items with a person who is sick. What increases the risk? Having a weak body defense system (immune system). Living with one or more children who are younger than 2 years. Living in a nursing home. Going on cruise  ships. What are the signs or symptoms? Symptoms of this condition start suddenly. Symptoms may last for a few days or for as long as a week. Common symptoms include: Watery poop. Vomiting. Other symptoms include: Fever. Headache. Feeling tired (fatigue). Pain in the belly (abdomen). Chills. Feeling weak. Feeling like you may vomit (nauseous). Muscle aches. Not feeling hungry. How is this treated? This condition typically goes away on its own. The focus of treatment is to replace the fluids that you lose. This condition may be treated with: An ORS (oral rehydration solution). This is a drink that helps you replace fluids and minerals your body lost. It is sold at pharmacies and stores. Medicines to help with your symptoms. Probiotic supplements to reduce symptoms of watery poop. Fluids given through an IV tube, if needed. Older adults and people with other diseases or a weak body defense system are at higher risk for not having enough water in the body. Follow these instructions at home: Eating and drinking  Take an ORS as told by your doctor. Drink clear fluids in small amounts as you are able. Clear fluids include: Water. Ice chips. Fruit juice that has water added to it (is diluted). Low-calorie sports drinks. Drink enough fluid to keep your pee (urine) pale yellow. Eat small amounts of healthy foods every 3-4 hours as you are able. This may include whole grains, fruits, vegetables, lean meats, and yogurt. Avoid fluids that have a lot of sugar or caffeine in them. This includes energy drinks, sports drinks, and soda. Avoid spicy or fatty foods. Avoid alcohol. General instructions  Wash your hands often. This is very important after you have watery poop or you vomit. If you cannot use soap and water, use hand sanitizer. Make sure that all people in your home wash their hands well and often. Take over-the-counter and prescription medicines only as told by your doctor. Rest  at home while you get better. Watch your condition for any changes. Take a warm bath to help with any burning or pain from having watery poop. Keep all follow-up visits. Contact a doctor if: You cannot keep fluids down. Your symptoms get worse. You have new symptoms. You feel light-headed or dizzy. You have muscle cramps. Get help right away if: You have chest pain. You have trouble breathing, or you are breathing very fast. You have a fast heartbeat. You feel very weak or you faint. You have a very bad headache, a stiff neck, or both. You have a rash. You have very bad pain, cramping, or bloating in your belly. Your skin feels cold and clammy. You feel mixed up (confused). You have pain when you pee. You have signs of not having enough water in the body, such as: Dark pee, hardly any pee, or no pee. Cracked lips. Dry mouth. Sunken eyes. Feeling very sleepy. Feeling weak. You have  signs of bleeding, such as: You see blood in your vomit. Your vomit looks like coffee grounds. You have bloody or black poop or poop that looks like tar. These symptoms may be an emergency. Get help right away. Call 911. Do not wait to see if the symptoms will go away. Do not drive yourself to the hospital. Summary Viral gastroenteritis is also known as the stomach flu. This condition can cause sudden watery poop (diarrhea), fever, and vomiting. These germs can be passed from person to person very easily. Take an ORS (oral rehydration solution) as told by your doctor. This is a drink that is sold at pharmacies and stores. Wash your hands often, especially after having watery poop or vomiting. If you cannot use soap and water, use hand sanitizer. This information is not intended to replace advice given to you by your health care provider. Make sure you discuss any questions you have with your health care provider. Document Revised: 03/08/2021 Document Reviewed: 03/08/2021 Elsevier Patient Education   [place].    If you have been instructed to have an in-person evaluation today at a local Urgent Care facility, please use the link below. It will take you to a list of all of our available [facility], including address, phone number and hours of operation. Please do not delay care.  [facility]  If you or a family member do not have a primary care provider, use the link below to schedule a visit and establish care. When you choose a [facility] primary care physician or advanced practice provider, you gain a long-term partner in health. Find a Primary Care Provider  Learn more about [facility]'s in-office and virtual care options: [facility]

## 2022-07-18 NOTE — ED Provider Notes (Signed)
[facility] Provider Note   CSN: [id number] Arrival date & time: 07/18/22  1255     History  Chief Complaint  Patient presents with   Sickle Cell Pain Crisis   Dizziness   Emesis    Barbara [name] is a 62 y.o. female.  Patient with history of sickle cell anemia presents today with complaints of nausea, vomiting, diarrhea, and abdominal pain. She states that same began initially 2 days ago on Saturday and has been persistent since. She endorses associated generalized abdominal pain and back pain that is consistent with her prior sickle cell pain crises. She has been unable to keep down any of her home meds. Does note that she was out of town in [place] all weekend but was sick the entire time and stayed in bed all weekend. She arrived back home late yesterday evening. She did a virtual appointment earlier today and was diagnosed with viral gastroenteritis and given a prescription for Zofran. She states that she was unable to drive to pick up this medication and called the sickle cell clinic nurse practitioner who recommended she come here for fluids. She presents for same. She denies fevers, chills, chest pain, or shortness of breath.   The history is provided by the patient. No language interpreter was used.  Sickle Cell Pain Crisis Associated symptoms: nausea and vomiting   Dizziness Associated symptoms: diarrhea, nausea and vomiting   Emesis Associated symptoms: abdominal pain and diarrhea        Home Medications Prior to Admission medications   Medication Sig Start Date End Date Taking? Authorizing Provider  amLODipine (NORVASC) 2.5 MG tablet Take 1 tablet (2.5 mg total) by mouth daily. 07/11/22 09/09/22  Bonnita Hollow, MD  diclofenac Sodium (VOLTAREN) 1 % GEL Apply 4 g topically 4 (four) times daily as needed. 03/28/22   Bonnita Hollow, MD  dicyclomine (BENTYL) 20 MG tablet Take 1 tablet (20 mg total) by mouth in the morning  and at bedtime. 07/18/22   Mar Daring, PA-C  famotidine (PEPCID) 40 MG tablet TAKE 1 TABLET BY MOUTH EVERYDAY AT BEDTIME 05/24/22   McElwee, Lauren A, NP  hydrochlorothiazide (HYDRODIURIL) 25 MG tablet Take 1 tablet (25 mg total) by mouth daily. 04/28/22   Bonnita Hollow, MD  lisinopril (ZESTRIL) 10 MG tablet Take 1 tablet (10 mg total) by mouth daily. 07/11/22   Bonnita Hollow, MD  montelukast (SINGULAIR) 10 MG tablet Take by mouth. Patient not taking: Reported on 07/12/2022    [provider]  ondansetron (ZOFRAN-ODT) 4 MG disintegrating tablet Take 1 tablet (4 mg total) by mouth every 8 (eight) hours as needed. 07/18/22   Mar Daring, PA-C  pantoprazole (PROTONIX) 40 MG tablet 1 tablet 12/26/14   [provider]  rosuvastatin (CRESTOR) 10 MG tablet Take 1 tablet (10 mg total) by mouth daily. 05/31/22   Bonnita Hollow, MD      Allergies    Iodinated contrast media, Iodine, Morphine and related, Other, Sulfa antibiotics, and Sulfamethoxazole-trimethoprim    Review of Systems   Review of Systems  Gastrointestinal:  Positive for abdominal pain, diarrhea, nausea and vomiting.  All other systems reviewed and are negative.   Physical Exam Updated Vital Signs BP 113/71   Pulse 69   Temp 98.2 F (36.8 C) (Oral)   Resp 16   Ht '5\' 9"'$  (1.753 m)   Wt 70.8 kg   LMP 10/04/2021   SpO2 100%  BMI 23.04 kg/m  Physical Exam Vitals and nursing note reviewed.  Constitutional:      General: She is not in acute distress.    Appearance: Normal appearance. She is normal weight. She is not ill-appearing, toxic-appearing or diaphoretic.  HENT:     Head: Normocephalic and atraumatic.  Cardiovascular:     Rate and Rhythm: Normal rate and regular rhythm.     Heart sounds: Normal heart sounds.  Pulmonary:     Effort: Pulmonary effort is normal. No respiratory distress.     Breath sounds: Normal breath sounds.  Abdominal:     General: Abdomen is flat.      Palpations: Abdomen is soft.     Comments: Mild generalized abdominal tenderness  Musculoskeletal:        General: Normal range of motion.     Cervical back: Normal range of motion.  Skin:    General: Skin is warm and dry.  Neurological:     General: No focal deficit present.     Mental Status: She is alert.  Psychiatric:        Mood and Affect: Mood normal.        Behavior: Behavior normal.     ED Results / Procedures / Treatments   Labs (all labs ordered are listed, but only abnormal results are displayed) Labs Reviewed  CBC WITH DIFFERENTIAL/PLATELET - Abnormal; Notable for the following components:      Result Value   RBC 3.62 (*)    Hemoglobin 10.4 (*)    HCT 30.0 (*)    All other components within normal limits  COMPREHENSIVE METABOLIC PANEL - Abnormal; Notable for the following components:   Calcium 8.6 (*)    All other components within normal limits  RETICULOCYTES - Abnormal; Notable for the following components:   RBC. 3.43 (*)    All other components within normal limits  URINALYSIS, ROUTINE W REFLEX MICROSCOPIC  I-STAT BETA HCG BLOOD, ED ([facility], [facility], [facility] ONLY)    EKG None  Radiology No results found.  Procedures Procedures    Medications Ordered in ED Medications  sodium chloride 0.9 % bolus 1,000 mL (0 mLs Intravenous Stopped 07/18/22 1612)  fentaNYL (SUBLIMAZE) injection 50 mcg (50 mcg Intravenous Given 07/18/22 1444)  ondansetron (ZOFRAN) injection 4 mg (4 mg Intravenous Given 07/18/22 1648)  fentaNYL (SUBLIMAZE) injection 50 mcg (50 mcg Intravenous Given 07/18/22 1649)  promethazine (PHENERGAN) 25 mg in sodium chloride 0.9 % 50 mL IVPB (25 mg Intravenous New Bag/Given 07/18/22 1801)    ED Course/ Medical Decision Making/ A&P                             Medical Decision Making Amount and/or Complexity of Data Reviewed Labs: ordered.  Risk Prescription drug management.   This patient is a 62 y.o. female who presents to the ED for concern of  nausea, vomiting, diarrhea, abdominal pain, this involves an extensive number of treatment options, and is a complaint that carries with it a high risk of complications and morbidity. The emergent differential diagnosis prior to evaluation includes, but is not limited to,  The differential diagnosis for generalized abdominal pain includes, but is not limited to AAA, gastroenteritis, appendicitis, Bowel obstruction, Bowel perforation. Gastroparesis, DKA, Hernia, Inflammatory bowel disease, mesenteric ischemia, pancreatitis, peritonitis SBP, volvulus.  This is not an exhaustive differential.   Past Medical History / Co-morbidities / Social History: Hx sickle cell anemia  Additional history: Chart  reviewed. Pertinent results include: Had a video visit earlier today and was diagnosed with viral gastroenteritis and prescribed Zofran.  Physical Exam: Physical exam performed. The pertinent findings include: mild generalized abdominal tenderness  Lab Tests: I ordered, and personally interpreted labs.  The pertinent results include:  no leukocytosis, hgb 10.4 consistent with previous. Reticulocyte count normal   Medications: I ordered medication including zofran, phenergan, fentanyl, fluids  for nausea, pain, and dehydration. Reevaluation of the patient after these medicines showed that the patient resolved. I have reviewed the patients home medicines and have made adjustments as needed.    Disposition:  Patient with history of sickle cell presents today with complaints of nausea, vomiting, diarrhea, and abdominal pain.  Patient is nontoxic, nonseptic appearing, in no apparent distress.  Patient's pain and other symptoms adequately managed in emergency department.  Fluid bolus given.  Labs and vitals reviewed.  Patient does not meet the SIRS or Sepsis criteria.  On repeat exam patient does not have a surgical abdomin and there are no peritoneal signs.  No indication of appendicitis, bowel obstruction,  bowel perforation, cholecystitis, diverticulitis, PID or ectopic pregnancy.  Did consider CT imaging for further evaluation, however after pain meds, antiemetics, and fluids, patient states that her symptoms have completely resolved.  She is able to eat and drink without any subsequent episodes of nausea or vomiting. Suspect gastroenteritis, likely viral. Her areas of discomfort are also consistent with prior flares of her sickle cell pain.  Shared decision making implemented and patient would prefer to defer imaging at this time.  Given that her symptoms have significantly improved, I think this is reasonable.  Patient discharged home with Phenergan suppositories symptomatic treatment, will also send for Percocet for management of her sickle cell pain crisis this patient states that she does not have any pain medication that she normally takes at home. PDMP reviewed. Patient given strict instructions for follow-up with their primary care physician.  I have also discussed reasons to return immediately to the ER.  Patient expresses understanding and agrees with plan.  Patient discharged in stable condition.   Final Clinical Impression(s) / ED Diagnoses Final diagnoses:  Sickle cell pain crisis ([place])  Nausea vomiting and diarrhea    Rx / DC Orders ED Discharge Orders          Ordered    promethazine (PHENERGAN) 25 MG suppository  Every 6 hours PRN        07/18/22 1914    oxyCODONE-acetaminophen (PERCOCET/ROXICET) 5-325 MG tablet  Every 6 hours PRN        07/18/22 1914          An After Visit Summary was printed and given to the patient.     Nestor Lewandowsky 07/18/22 1921    Cristie Hem, MD 07/19/22 0800

## 2022-07-18 NOTE — Telephone Encounter (Signed)
Pt was advised to call the day hospital. [facility]

## 2022-07-19 ENCOUNTER — Telehealth: Payer: Self-pay

## 2022-07-19 NOTE — Transitions of Care (Post Inpatient/ED Visit) (Signed)
   07/19/2022  Name: Barbara [name] MRN: [id number] DOB: 06-Jun-1960  Today's TOC FU Call Status: Today's TOC FU Call Status:: Successful TOC FU Call Competed TOC FU Call Complete Date: 07/19/22  Transition Care Management Follow-up Telephone Call Date of Discharge: 07/18/22 Discharge Facility: [facility]) Type of Discharge: Emergency Department Reason for ED Visit:  (sickle cell crisis) How have you been since you were released from the hospital?: Better Any questions or concerns?: No  Items Reviewed: Did you receive and understand the discharge instructions provided?: Yes Medications obtained and verified?: Yes (Medications Reviewed) Any new allergies since your discharge?: No Dietary orders reviewed?: No Do you have support at home?: Yes  Home Care and Equipment/Supplies: [place] Ordered?: NA Any new equipment or medical supplies ordered?: NA  Functional Questionnaire: Do you need assistance with bathing/showering or dressing?: No Do you need assistance with meal preparation?: No Do you have difficulty maintaining continence: No Do you need assistance with getting out of bed/getting out of a chair/moving?: No Do you have difficulty managing or taking your medications?: No  Folllow up appointments reviewed: PCP Follow-up appointment confirmed?: Yes Date of PCP follow-up appointment?: 07/21/22 Follow-up Provider: PCP [facility] Follow-up appointment confirmed?: NA Do you need transportation to your follow-up appointment?: No Do you understand care options if your condition(s) worsen?: Yes-patient verbalized understanding  SDOH Interventions Today    Flowsheet Row Most Recent Value  SDOH Interventions   Food Insecurity Interventions Intervention Not Indicated  Housing Interventions Intervention Not Indicated  Transportation Interventions Intervention Not Indicated  Utilities Interventions Intervention Not Indicated  Alcohol Usage  Interventions Intervention Not Indicated (Score <7)  Financial Strain Interventions Intervention Not Indicated  Physical Activity Interventions Intervention Not Indicated  Stress Interventions Intervention Not Indicated       SIGNATURE Angeline Slim, RN, BSN

## 2022-07-20 NOTE — Progress Notes (Signed)
Barbara [name] is a 62 year old female that presented with inquiries about possible sickle cell disease.  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 Individuals with hemoglobin C trait are NOT at risk to develop sickle cell disease or hemoglobin C disease. They generally do NOT have any medical problems related to sickle cell disease. No hematology referral warranted. Please continue to follow up with PCP.   Marland KitchenDonia Pounds  APRN, MSN, FNP-C [facility] [address] [place], [place] [address] [phone]

## 2022-07-21 ENCOUNTER — Encounter: Payer: Self-pay | Admitting: Family Medicine

## 2022-07-21 ENCOUNTER — Ambulatory Visit (INDEPENDENT_AMBULATORY_CARE_PROVIDER_SITE_OTHER): Payer: 59 | Admitting: Family Medicine

## 2022-07-21 VITALS — BP 118/70 | HR 63 | Temp 97.8°F | Wt 154.4 lb

## 2022-07-21 DIAGNOSIS — M545 Low back pain, unspecified: Secondary | ICD-10-CM | POA: Diagnosis not present

## 2022-07-21 DIAGNOSIS — A084 Viral intestinal infection, unspecified: Secondary | ICD-10-CM

## 2022-07-21 DIAGNOSIS — D573 Sickle-cell trait: Secondary | ICD-10-CM

## 2022-07-21 MED ORDER — CYCLOBENZAPRINE HCL 5 MG PO TABS: 5.0000 mg | ORAL_TABLET | Freq: Every day | ORAL | 1 refills | Status: DC

## 2022-07-21 NOTE — Patient Instructions (Signed)
Continue to hydrate.  You may advance her diet as you are able to tolerate.  Try Flexeril for lower back pain.  Please come back to the office if no improvement, otherwise follow-up as needed

## 2022-07-21 NOTE — Progress Notes (Signed)
Assessment/Plan:   Problem List Items Addressed This Visit       Digestive   Viral gastroenteritis    The patient experienced severe abdominal pain, nausea, and vomiting, leading to an ED visit for suspected dehydration and abdominal distress.  Differential diagnosis:  Gastroenteritis, likely viral in origin, causing nausea, vomiting, and diarrhea. Gastritis or exacerbation of GERD due to forceful vomiting. Muscular strain in the back due to vomiting.  Plan: Continue nausea control with promethazine as needed. For pain control, consider cyclobenzaprine (Flexaril) trial at 5 to 10 mg at bedtime to manage muscle spasms, with caution regarding potential sedation. Encourage oral rehydration and attempt a bland diet as tolerated. Advised to continue Tylenol while at work but may add ibuprofen with caution due to history of GERD if needed for additional analgesia. Avoid opioids during the daytime to prevent exacerbation of constipation and dehydration. Follow-up if symptoms escalate or do not improve over the week. Provided a doctor's note for work absence to allow for adequate recovery.        Other   Sickle cell trait (HCC)    Chronic anemia well-known to the patient, with stable hemoglobin levels around 10 to 11 g/dL.  Plan:  Continue current management with iron and folate supplementation. Periodic monitoring of hemoglobin levels as previously directed by Hematology.      Lower back pain - Primary    Patient reports bilateral lower back pain, which may be related to muscular strain secondary to severe vomiting. She has a history of L5 spinal issue but states this pain is different in character.  Differential diagnosis:  Muscular strain secondary to forceful vomiting. Possible exacerbation of underlying L5 spinal issue, though pain presentation is atypical.  Plan: Recommend trial of cyclobenzaprine for muscle relaxation, particularly at bedtime. Encourage the use of  heat or cold packs as needed for muscle pain relief. If pain persists or worsens, consider further imaging studies to rule out other causes such as a kidney stone or musculoskeletal pathology. Follow up as needed based on the evolution of symptoms.      Relevant Medications   cyclobenzaprine (FLEXERIL) 5 MG tablet    Medications Discontinued During This Encounter  Medication Reason   ondansetron (ZOFRAN-ODT) 4 MG disintegrating tablet       Subjective:  HPI: Encounter date: 07/21/2022  Barbara [name] is a 62 y.o. female who has GERD (gastroesophageal reflux disease); Sickle cell trait ([place]); Abnormal uterine bleeding (AUB); Primary hypertension; Lower back pain; Onychomycosis; Raynaud's phenomenon without gangrene; and Viral gastroenteritis on their problem list..   She  has a past medical history of GERD (gastroesophageal reflux disease), Hypertension, Low iron, and Sickle cell anemia ([place])..   CHIEF COMPLAINT: Patient presenting with abdominal pain, nausea, vomiting, diarrhea, lower back pain, leg discomfort, and dehydration.  HISTORY OF PRESENT ILLNESS:   Nausea/Vomiting/Diarrhea: Patient reports that symptoms began on a Saturday and included severe stomach pain, nausea, vomiting, and diarrhea, leading to a virtual ED visit on Monday. No bloody stool or vomit was identified. Inability to eat or drink was noted, and symptoms included difficulty keeping down fluids and food, leading to dehydration and persistent nausea. Diarrhea has reportedly subsided since.  Lower Back Pain: The patient describes lower back pain localized primarily to the epigastric area that began after the onset of vomiting and extended to include abdominal pain. The pain in the legs was described as highly sensitive to touch, resembling a fire-like sensation. The patient's pain was severe enough to influence  her decision to seek emergency treatment.  ROS:  Gastrointestinal: Positive for severe abdominal pain,  nausea without current vomiting, discontinued diarrhea, constipation. Musculoskeletal: Positive for lower back pain radiating bilaterally. No trauma reported. Genitourinary: Change in urinary flow, initially "forced," now normalized; no dysuria. Neurological: Sensation of leg and hand discomfort consistent with neuropathy. Hematology: Known anemia with stable hemoglobin levels around 10 to 11 g/dL.  Past Surgical History:  Procedure Laterality Date   ANTERIOR CRUCIATE LIGAMENT REPAIR     CHOLECYSTECTOMY     ENDOMETRIAL ABLATION     LEFT HEART CATHETERIZATION WITH CORONARY ANGIOGRAM N/A 08/27/2014   Procedure: LEFT HEART CATHETERIZATION WITH CORONARY ANGIOGRAM;  Surgeon: Wellington Hampshire, MD;  Location: [facility];  Service: Cardiovascular;  Laterality: N/A;    Outpatient Medications Prior to Visit  Medication Sig Dispense Refill   amLODipine (NORVASC) 2.5 MG tablet Take 1 tablet (2.5 mg total) by mouth daily. 90 tablet 0   diclofenac Sodium (VOLTAREN) 1 % GEL Apply 4 g topically 4 (four) times daily as needed. 100 g 3   dicyclomine (BENTYL) 20 MG tablet Take 1 tablet (20 mg total) by mouth in the morning and at bedtime. 20 tablet 0   famotidine (PEPCID) 40 MG tablet TAKE 1 TABLET BY MOUTH EVERYDAY AT BEDTIME 90 tablet 1   hydrochlorothiazide (HYDRODIURIL) 25 MG tablet Take 1 tablet (25 mg total) by mouth daily. 90 tablet 3   lisinopril (ZESTRIL) 10 MG tablet Take 1 tablet (10 mg total) by mouth daily. 180 tablet 1   montelukast (SINGULAIR) 10 MG tablet Take by mouth.     oxyCODONE-acetaminophen (PERCOCET/ROXICET) 5-325 MG tablet Take 1 tablet by mouth every 6 (six) hours as needed for severe pain. 15 tablet 0   pantoprazole (PROTONIX) 40 MG tablet 1 tablet     promethazine (PHENERGAN) 25 MG suppository Place 1 suppository (25 mg total) rectally every 6 (six) hours as needed for nausea or vomiting. 12 each 0   rosuvastatin (CRESTOR) 10 MG tablet Take 1 tablet (10 mg total) by mouth daily. 90  tablet 3   ondansetron (ZOFRAN-ODT) 4 MG disintegrating tablet Take 1 tablet (4 mg total) by mouth every 8 (eight) hours as needed. (Patient not taking: Reported on 07/21/2022) 20 tablet 0   No facility-administered medications prior to visit.    Family History  Problem Relation Age of Onset   CAD Brother 25       CABG at age 52, and deceased   CAD Sister 27       1 stent   CAD Father 60       Deceased in 63s    Social History   Socioeconomic History   Marital status: Divorced    Spouse name: Not on file   Number of children: 2   Years of education: Not on file   Highest education level: Not on file  Occupational History   Not on file  Tobacco Use   Smoking status: Former    Years: 1.00    Types: Cigarettes    Quit date: 08/26/2013    Years since quitting: 8.9   Smokeless tobacco: Never  Vaping Use   Vaping Use: Never used  Substance and Sexual Activity   Alcohol use: Yes    Comment: occasionally, glass of wine   Drug use: No   Sexual activity: Not on file  Other Topics Concern   Not on file  Social History Narrative   Not on file   Social Determinants  of Health   Financial Resource Strain: Low Risk  (07/19/2022)   Overall Financial Resource Strain (CARDIA)    Difficulty of Paying Living Expenses: Not very hard  Food Insecurity: No Food Insecurity (07/19/2022)   Hunger Vital Sign    Worried About Running Out of Food in the Last Year: Never true    Ran Out of Food in the Last Year: Never true  Transportation Needs: No Transportation Needs (07/19/2022)   PRAPARE - Hydrologist (Medical): No    Lack of Transportation (Non-Medical): No  Physical Activity: Sufficiently Active (07/19/2022)   Exercise Vital Sign    Days of Exercise per Week: 5 days    Minutes of Exercise per Session: 150+ min  Stress: No Stress Concern Present (07/19/2022)   [place]    Feeling of Stress :  Not at all  Social Connections: Not on file  Intimate Partner Violence: Not on file                                                                                                 Objective:  Physical Exam: BP 118/70 (BP Location: Left Arm, Patient Position: Sitting, Cuff Size: Large)   Pulse 63   Temp 97.8 F (36.6 C) (Temporal)   Wt 154 lb 6.4 oz (70 kg)   LMP 10/04/2021   SpO2 98%   BMI 22.80 kg/m      Results for orders placed or performed during the hospital encounter of 07/18/22  CBC WITH DIFFERENTIAL  Result Value Ref Range   WBC 5.5 4.0 - 10.5 K/uL   RBC 3.62 (L) 3.87 - 5.11 MIL/uL   Hemoglobin 10.4 (L) 12.0 - 15.0 g/dL   HCT 30.0 (L) 36.0 - 46.0 %   MCV 82.9 80.0 - 100.0 fL   MCH 28.7 26.0 - 34.0 pg   MCHC 34.7 30.0 - 36.0 g/dL   RDW 13.4 11.5 - 15.5 %   Platelets 242 150 - 400 K/uL   nRBC 0.0 0.0 - 0.2 %   Neutrophils Relative % 56 %   Neutro Abs 3.1 1.7 - 7.7 K/uL   Lymphocytes Relative 33 %   Lymphs Abs 1.8 0.7 - 4.0 K/uL   Monocytes Relative 10 %   Monocytes Absolute 0.5 0.1 - 1.0 K/uL   Eosinophils Relative 1 %   Eosinophils Absolute 0.0 0.0 - 0.5 K/uL   Basophils Relative 0 %   Basophils Absolute 0.0 0.0 - 0.1 K/uL   Immature Granulocytes 0 %   Abs Immature Granulocytes 0.01 0.00 - 0.07 K/uL  Comprehensive metabolic panel  Result Value Ref Range   Sodium 137 135 - 145 mmol/L   Potassium 3.5 3.5 - 5.1 mmol/L   Chloride 101 98 - 111 mmol/L   CO2 23 22 - 32 mmol/L   Glucose, Bld 87 70 - 99 mg/dL   BUN 15 6 - 20 mg/dL   Creatinine, Ser 0.98 0.44 - 1.00 mg/dL   Calcium 8.6 (L) 8.9 - 10.3 mg/dL   Total Protein 7.5 6.5 -  8.1 g/dL   Albumin 3.7 3.5 - 5.0 g/dL   AST 24 15 - 41 U/L   ALT 20 0 - 44 U/L   Alkaline Phosphatase 72 38 - 126 U/L   Total Bilirubin 0.5 0.3 - 1.2 mg/dL   GFR, Estimated >60 >60 mL/min   Anion gap 13 5 - 15  Reticulocytes  Result Value Ref Range   Retic Ct Pct 1.2 0.4 - 3.1 %   RBC. 3.43 (L) 3.87 - 5.11 MIL/uL   Retic  Count, Absolute 41.2 19.0 - 186.0 K/uL   Immature Retic Fract 10.7 2.3 - 15.9 %  I-Stat beta hCG blood, ED  Result Value Ref Range   I-stat hCG, quantitative <5.0 <5 mIU/mL   Comment 3                 Alesia Banda, MD, MS

## 2022-07-24 DIAGNOSIS — A084 Viral intestinal infection, unspecified: Secondary | ICD-10-CM | POA: Insufficient documentation

## 2022-07-24 HISTORY — DX: Viral intestinal infection, unspecified: A08.4

## 2022-07-24 NOTE — Assessment & Plan Note (Signed)
Patient reports bilateral lower back pain, which may be related to muscular strain secondary to severe vomiting. She has a history of L5 spinal issue but states this pain is different in character.  Differential diagnosis:  Muscular strain secondary to forceful vomiting. Possible exacerbation of underlying L5 spinal issue, though pain presentation is atypical.  Plan: Recommend trial of cyclobenzaprine for muscle relaxation, particularly at bedtime. Encourage the use of heat or cold packs as needed for muscle pain relief. If pain persists or worsens, consider further imaging studies to rule out other causes such as a kidney stone or musculoskeletal pathology. Follow up as needed based on the evolution of symptoms.

## 2022-07-24 NOTE — Assessment & Plan Note (Signed)
The patient experienced severe abdominal pain, nausea, and vomiting, leading to an ED visit for suspected dehydration and abdominal distress.  Differential diagnosis:  Gastroenteritis, likely viral in origin, causing nausea, vomiting, and diarrhea. Gastritis or exacerbation of GERD due to forceful vomiting. Muscular strain in the back due to vomiting.  Plan: Continue nausea control with promethazine as needed. For pain control, consider cyclobenzaprine (Flexaril) trial at 5 to 10 mg at bedtime to manage muscle spasms, with caution regarding potential sedation. Encourage oral rehydration and attempt a bland diet as tolerated. Advised to continue Tylenol while at work but may add ibuprofen with caution due to history of GERD if needed for additional analgesia. Avoid opioids during the daytime to prevent exacerbation of constipation and dehydration. Follow-up if symptoms escalate or do not improve over the week. Provided a doctor's note for work absence to allow for adequate recovery.

## 2022-07-24 NOTE — Assessment & Plan Note (Signed)
Chronic anemia well-known to the patient, with stable hemoglobin levels around 10 to 11 g/dL.  Plan:  Continue current management with iron and folate supplementation. Periodic monitoring of hemoglobin levels as previously directed by Hematology.

## 2022-08-20 ENCOUNTER — Other Ambulatory Visit: Payer: Self-pay | Admitting: Family Medicine

## 2022-08-20 DIAGNOSIS — M545 Low back pain, unspecified: Secondary | ICD-10-CM

## 2022-08-22 NOTE — Telephone Encounter (Signed)
Chart supports rx. Last OV: 07/11/2022 Next OV: 09/08/2022

## 2022-08-23 ENCOUNTER — Other Ambulatory Visit: Payer: Self-pay | Admitting: Family Medicine

## 2022-08-23 DIAGNOSIS — G8929 Other chronic pain: Secondary | ICD-10-CM

## 2022-08-23 NOTE — Telephone Encounter (Signed)
Chart supports rx. Last OV: 07/21/2022 Next OV: 09/08/2022

## 2022-09-07 ENCOUNTER — Telehealth: Payer: Self-pay

## 2022-09-07 DIAGNOSIS — E782 Mixed hyperlipidemia: Secondary | ICD-10-CM

## 2022-09-07 MED ORDER — ROSUVASTATIN CALCIUM 10 MG PO TABS: 10.0000 mg | ORAL_TABLET | Freq: Every day | ORAL | 3 refills | Status: DC

## 2022-09-07 NOTE — Telephone Encounter (Signed)
Received rx refill request via fax through [facility] for rosuvastatin 10 mg.   Chart supports rx. Last OV: 07/21/2022 Next OV: 09/08/2022

## 2022-09-08 ENCOUNTER — Telehealth: Payer: Self-pay

## 2022-09-08 ENCOUNTER — Encounter: Payer: Self-pay | Admitting: Family Medicine

## 2022-09-08 ENCOUNTER — Ambulatory Visit (INDEPENDENT_AMBULATORY_CARE_PROVIDER_SITE_OTHER): Payer: 59 | Admitting: Family Medicine

## 2022-09-08 VITALS — BP 134/86 | HR 76 | Temp 97.5°F | Wt 154.8 lb

## 2022-09-08 DIAGNOSIS — I7300 Raynaud's syndrome without gangrene: Secondary | ICD-10-CM

## 2022-09-08 DIAGNOSIS — N939 Abnormal uterine and vaginal bleeding, unspecified: Secondary | ICD-10-CM | POA: Diagnosis not present

## 2022-09-08 DIAGNOSIS — J302 Other seasonal allergic rhinitis: Secondary | ICD-10-CM | POA: Diagnosis not present

## 2022-09-08 DIAGNOSIS — I10 Essential (primary) hypertension: Secondary | ICD-10-CM

## 2022-09-08 MED ORDER — AMLODIPINE BESYLATE 5 MG PO TABS: 5.0000 mg | ORAL_TABLET | Freq: Every day | ORAL | 0 refills | Status: DC

## 2022-09-08 MED ORDER — MONTELUKAST SODIUM 10 MG PO TABS: 10.0000 mg | ORAL_TABLET | Freq: Every day | ORAL | 0 refills | Status: DC

## 2022-09-08 NOTE — Telephone Encounter (Signed)
Called patient to schedule a new GYN appointment in our office. Left our contact information for her to call back.

## 2022-09-08 NOTE — Progress Notes (Signed)
Assessment/Plan:   Problem List Items Addressed This Visit       Cardiovascular and Mediastinum   Primary hypertension    Mild elevation in blood pressure   Plan: Continue lisinopril 10 mg and hydrochlorothiazide 25 mg Increase amlodipine to 5 mg. Monitor blood pressure and reassess in 3 months       Relevant Medications   amLODipine (NORVASC) 5 MG tablet     Genitourinary   Abnormal uterine bleeding (AUB) - Primary    Recurrence of postmenopausal bleeding requires attention to exclude oncological etiology.  Plan: Urgent referral to OBGYN for examination and potential additional diagnostic procedures.      Relevant Orders   Ambulatory referral to Obstetrics / Gynecology     Other   Raynaud's phenomenon without gangrene    Mild improvement, but persistent recurrence of symptoms   Plan: Increase amlodipine 5 mg and monitor symptom response Refer to rheumatology for evaluation, including possible capillaroscopy. Patient advised to document episodes via video for better clinical understanding.      Relevant Orders   Ambulatory referral to Rheumatology   Other Visit Diagnoses     Seasonal allergies       Relevant Medications   montelukast (SINGULAIR) 10 MG tablet       Medications Discontinued During This Encounter  Medication Reason   oxyCODONE-acetaminophen (PERCOCET/ROXICET) 5-325 MG tablet    promethazine (PHENERGAN) 25 MG suppository    montelukast (SINGULAIR) 10 MG tablet Reorder   amLODipine (NORVASC) 2.5 MG tablet Reorder    Return in about 3 months (around 12/08/2022) for BP.    Subjective:   Encounter date: 09/08/2022  Kaleisha Bhargava is a 62 y.o. female who has GERD (gastroesophageal reflux disease); Abnormal uterine bleeding (AUB); Primary hypertension; Lower back pain; Onychomycosis; and Raynaud's phenomenon without gangrene on their problem list..   She  has a past medical history of GERD (gastroesophageal reflux disease), Hypertension,  Low iron, Sickle cell trait, and Viral gastroenteritis (07/24/2022)..   CHIEF COMPLAINT: Coco Sharpnack, a 62 year old female, presents for follow-up related to management of chronic conditions: blood pressure monitoring, Raynaud's phenomenon, and medication refill for allergies.  HISTORY OF PRESENT ILLNESS:  Blood Pressure and Amlodipine:  Patient currently taking lisinopril 10 mg, hydrochlorothiazide 25 mg, and amlodipine 2.5 mg.  Patient taking medications without complaint of side effects.  Patient also denies headache, blurry vision, chest pain, shortness of breath.   Raynaud's Phenomenon: Mildly improved on amlodipine 2.5 mg which was recently started, however patient reports ongoing blanching episodes associated with numbness and inability to use hands effectively.  Allergies:  Patient reports seasonal allergies stable, however she needs refill on her Singulair.    Abnormal Uterine Bleeding:  Reports unusual heavy spotting lasting for 5 days within the past month.  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    Review of Systems  All other systems reviewed and are negative.   Past Surgical History:  Procedure Laterality Date   ANTERIOR CRUCIATE LIGAMENT REPAIR     CHOLECYSTECTOMY     ENDOMETRIAL ABLATION     LEFT HEART CATHETERIZATION WITH CORONARY ANGIOGRAM N/A 08/27/2014   Procedure: LEFT HEART CATHETERIZATION WITH CORONARY ANGIOGRAM;  Surgeon: Iran Ouch, MD;  Location: [facility];  Service: Cardiovascular;  Laterality: N/A;    Outpatient Medications Prior to Visit  Medication Sig Dispense Refill   cyclobenzaprine (FLEXERIL) 5 MG tablet TAKE 1-2 TABLETS (5-10 MG TOTAL)  BY MOUTH AT BEDTIME. 30 tablet 1   diclofenac Sodium (VOLTAREN) 1 % GEL APPLY 4 G TOPICALLY 4 (FOUR) TIMES DAILY AS NEEDED. 100 g 3   dicyclomine (BENTYL) 20 MG tablet Take 1 tablet (20 mg total) by mouth in the morning and at bedtime. 20 tablet 0    famotidine (PEPCID) 40 MG tablet TAKE 1 TABLET BY MOUTH EVERYDAY AT BEDTIME 90 tablet 1   hydrochlorothiazide (HYDRODIURIL) 25 MG tablet Take 1 tablet (25 mg total) by mouth daily. 90 tablet 3   lisinopril (ZESTRIL) 10 MG tablet Take 1 tablet (10 mg total) by mouth daily. 180 tablet 1   pantoprazole (PROTONIX) 40 MG tablet 1 tablet     rosuvastatin (CRESTOR) 10 MG tablet Take 1 tablet (10 mg total) by mouth daily. 90 tablet 3   amLODipine (NORVASC) 2.5 MG tablet Take 1 tablet (2.5 mg total) by mouth daily. 90 tablet 0   montelukast (SINGULAIR) 10 MG tablet Take by mouth.     oxyCODONE-acetaminophen (PERCOCET/ROXICET) 5-325 MG tablet Take 1 tablet by mouth every 6 (six) hours as needed for severe pain. (Patient not taking: Reported on 09/08/2022) 15 tablet 0   promethazine (PHENERGAN) 25 MG suppository Place 1 suppository (25 mg total) rectally every 6 (six) hours as needed for nausea or vomiting. (Patient not taking: Reported on 09/08/2022) 12 each 0   No facility-administered medications prior to visit.    Family History  Problem Relation Age of Onset   CAD Brother 54       CABG at age 5, and deceased   CAD Sister 53       1 stent   CAD Father 49       Deceased in 58s    Social History   Socioeconomic History   Marital status: Divorced    Spouse name: Not on file   Number of children: 2   Years of education: Not on file   Highest education level: Not on file  Occupational History   Not on file  Tobacco Use   Smoking status: Former    Years: 1    Types: Cigarettes    Quit date: 08/26/2013    Years since quitting: 9.0   Smokeless tobacco: Never  Vaping Use   Vaping Use: Never used  Substance and Sexual Activity   Alcohol use: Yes    Comment: occasionally, glass of wine   Drug use: No   Sexual activity: Not on file  Other Topics Concern   Not on file  Social History Narrative   Not on file   Social Determinants of Health   Financial Resource Strain: Low Risk   (07/19/2022)   Overall Financial Resource Strain (CARDIA)    Difficulty of Paying Living Expenses: Not very hard  Food Insecurity: No Food Insecurity (07/19/2022)   Hunger Vital Sign    Worried About Running Out of Food in the Last Year: Never true    Ran Out of Food in the Last Year: Never true  Transportation Needs: No Transportation Needs (07/19/2022)   PRAPARE - Administrator, Civil Service (Medical): No    Lack of Transportation (Non-Medical): No  Physical Activity: Sufficiently Active (07/19/2022)   Exercise Vital Sign    Days of Exercise per Week: 5 days    Minutes of Exercise per Session: 150+ min  Stress: No Stress Concern Present (07/19/2022)   [employer] of Occupational Health - Occupational Stress Questionnaire    Feeling of Stress : Not  at all  Social Connections: Not on file  Intimate Partner Violence: Not on file                                                                                                  Objective:  Physical Exam: BP 134/86 (BP Location: Left Arm, Patient Position: Sitting, Cuff Size: Large)   Pulse 76   Temp (!) 97.5 F (36.4 C) (Temporal)   Wt 154 lb 12.8 oz (70.2 kg)   SpO2 98%   BMI 22.86 kg/m     Physical Exam Constitutional:      General: She is not in acute distress.    Appearance: Normal appearance. She is not ill-appearing or toxic-appearing.  HENT:     Head: Normocephalic and atraumatic.     Nose: Nose normal. No congestion.  Eyes:     General: No scleral icterus.    Extraocular Movements: Extraocular movements intact.  Cardiovascular:     Rate and Rhythm: Normal rate and regular rhythm.     Pulses: Normal pulses.     Heart sounds: Normal heart sounds.  Pulmonary:     Effort: Pulmonary effort is normal. No respiratory distress.     Breath sounds: Normal breath sounds.  Abdominal:     General: Abdomen is flat. Bowel sounds are normal.     Palpations: Abdomen is soft.  Musculoskeletal:        General:  Normal range of motion.  Lymphadenopathy:     Cervical: No cervical adenopathy.  Skin:    General: Skin is warm and dry.     Capillary Refill: Capillary refill takes less than 2 seconds.     Findings: No rash.     Comments: No signs of skin ulceration, cyanosis or nail bed changes on hands bilaterally  Neurological:     General: No focal deficit present.     Mental Status: She is alert and oriented to person, place, and time. Mental status is at baseline.  Psychiatric:        Mood and Affect: Mood normal.        Behavior: Behavior normal.        Thought Content: Thought content normal.        Judgment: Judgment normal.     No results found.  Recent Results (from the past 2160 hour(s))  Comp Met (CMET)     Status: None   Collection Time: 06/28/22  8:47 AM  Result Value Ref Range   Sodium 139 135 - 145 mEq/L   Potassium 4.1 3.5 - 5.1 mEq/L   Chloride 103 96 - 112 mEq/L   CO2 24 19 - 32 mEq/L   Glucose, Bld 91 70 - 99 mg/dL   BUN 22 6 - 23 mg/dL   Creatinine, Ser 1.61 0.40 - 1.20 mg/dL   Total Bilirubin 0.2 0.2 - 1.2 mg/dL   Alkaline Phosphatase 70 39 - 117 U/L   AST 14 0 - 37 U/L   ALT 11 0 - 35 U/L   Total Protein 7.0 6.0 - 8.3 g/dL   Albumin 4.4 3.5 - 5.2  g/dL   GFR 40.34 >74.25 mL/min    Comment: Calculated using the CKD-EPI Creatinine Equation (2021)   Calcium 9.0 8.4 - 10.5 mg/dL  CBC w/Diff     Status: Abnormal   Collection Time: 06/28/22  8:47 AM  Result Value Ref Range   WBC 6.2 4.0 - 10.5 K/uL   RBC 3.30 (L) 3.87 - 5.11 Mil/uL   Hemoglobin 9.8 (L) 12.0 - 15.0 g/dL   HCT 95.6 (L) 38.7 - 56.4 %   MCV 85.6 78.0 - 100.0 fl   MCHC 34.6 30.0 - 36.0 g/dL   RDW 33.2 95.1 - 88.4 %   Platelets 246.0 150.0 - 400.0 K/uL   Neutrophils Relative % 55.9 43.0 - 77.0 %   Lymphocytes Relative 35.5 12.0 - 46.0 %   Monocytes Relative 7.5 3.0 - 12.0 %   Eosinophils Relative 0.7 0.0 - 5.0 %   Basophils Relative 0.4 0.0 - 3.0 %   Neutro Abs 3.5 1.4 - 7.7 K/uL   Lymphs Abs 2.2  0.7 - 4.0 K/uL   Monocytes Absolute 0.5 0.1 - 1.0 K/uL   Eosinophils Absolute 0.0 0.0 - 0.7 K/uL   Basophils Absolute 0.0 0.0 - 0.1 K/uL  Urine Microalbumin w/creat. ratio     Status: None   Collection Time: 06/28/22  8:47 AM  Result Value Ref Range   Microalb, Ur 1.6 0.0 - 1.9 mg/dL   Creatinine,U 166.0 mg/dL   Microalb Creat Ratio 0.7 0.0 - 30.0 mg/g  Hemoglobin A1C     Status: None   Collection Time: 06/28/22  8:47 AM  Result Value Ref Range   Hgb A1c MFr Bld 6.0 4.6 - 6.5 %    Comment: Glycemic Control Guidelines for People with Diabetes:Non Diabetic:  <6%Goal of Therapy: <7%Additional Action Suggested:  >8%   Lipid Profile     Status: None   Collection Time: 06/28/22  8:47 AM  Result Value Ref Range   Cholesterol 164 0 - 200 mg/dL    Comment: ATP III Classification       Desirable:  < 200 mg/dL               Borderline High:  200 - 239 mg/dL          High:  > = 630 mg/dL   Triglycerides 16.0 0.0 - 149.0 mg/dL    Comment: Normal:  <109 mg/dLBorderline High:  150 - 199 mg/dL   HDL 32.35 >57.32 mg/dL   VLDL 20.2 0.0 - 54.2 mg/dL   LDL Cholesterol 62 0 - 99 mg/dL   Total CHOL/HDL Ratio 2     Comment:                Men          Women1/2 Average Risk     3.4          3.3Average Risk          5.0          4.42X Average Risk          9.6          7.13X Average Risk          15.0          11.0                       NonHDL 78.65     Comment: NOTE:  Non-HDL goal should be 30 mg/dL higher than patient's LDL goal (  i.e. LDL goal of < 70 mg/dL, would have non-HDL goal of < 100 mg/dL)  TSH     Status: None   Collection Time: 06/28/22  8:47 AM  Result Value Ref Range   TSH 2.91 0.35 - 5.50 uIU/mL  Urinalysis, Routine w reflex microscopic     Status: Abnormal   Collection Time: 06/28/22  8:47 AM  Result Value Ref Range   Color, Urine YELLOW Yellow;Lt. Yellow;Straw;Dark Yellow;Amber;Green;Red;Brown   APPearance CLEAR Clear;Turbid;Slightly Cloudy;Cloudy   Specific Gravity, Urine 1.025 1.000 -  1.030   pH 6.0 5.0 - 8.0   Total Protein, Urine NEGATIVE Negative   Urine Glucose NEGATIVE Negative   Ketones, ur TRACE (A) Negative   Bilirubin Urine NEGATIVE Negative   Hgb urine dipstick NEGATIVE Negative   Urobilinogen, UA 0.2 0.0 - 1.0   Leukocytes,Ua NEGATIVE Negative   Nitrite NEGATIVE Negative   WBC, UA 0-2/hpf 0-2/hpf   RBC / HPF none seen 0-2/hpf   Mucus, UA Presence of (A) None   Squamous Epithelial / HPF Few(5-10/hpf) (A) Rare(0-4/hpf)  Vitamin B12     Status: None   Collection Time: 06/28/22  8:52 AM  Result Value Ref Range   Vitamin B-12 347 211 - 911 pg/mL  Folate     Status: None   Collection Time: 06/28/22  8:52 AM  Result Value Ref Range   Folate 6.8 >5.9 ng/mL  Iron and TIBC     Status: None   Collection Time: 06/28/22  8:52 AM  Result Value Ref Range   Total Iron Binding Capacity 325 250 - 450 ug/dL   UIBC 161 096 - 045 ug/dL   Iron 81 27 - 409 ug/dL   Iron Saturation 25 15 - 55 %  Ferritin     Status: None   Collection Time: 06/28/22  8:52 AM  Result Value Ref Range   Ferritin 177.3 10.0 - 291.0 ng/mL  Sedimentation rate     Status: None   Collection Time: 07/11/22  9:52 AM  Result Value Ref Range   Sed Rate 21 0 - 30 mm/hr  C-reactive protein     Status: None   Collection Time: 07/11/22  9:52 AM  Result Value Ref Range   CRP <1.0 0.5 - 20.0 mg/dL  ANA w/Reflex     Status: None   Collection Time: 07/11/22  9:52 AM  Result Value Ref Range   Anti Nuclear Antibody (ANA) Negative Negative  Hgb Fractionation Cascade     Status: None   Collection Time: 07/12/22  9:32 AM  Result Value Ref Range   Hgb F Reflexed 0.0 - 2.0 %   Hgb A Reflexed 96.4 - 98.8 %   Hgb A2 Reflexed 1.8 - 3.2 %   Hgb S Reflexed 0.0 %   Interpretation, Hgb Fract Comment     Comment: Capillary Electrophoresis (CE) performed. Reflex to High-pressure liquid chromatography (HPLC) method for confirmation.   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 Suggest clinical and hematologic correlation.             Hgb C-Trait Interpretation Ranges             Hgb A      50.0 - 70.0%             Hgb C      30.0 - 45.0%             Hgb A2      2.0 -  4.5%   CBC WITH DIFFERENTIAL     Status: Abnormal   Collection Time: 07/18/22  1:55 PM  Result Value Ref Range   WBC 5.5 4.0 - 10.5 K/uL   RBC 3.62 (L) 3.87 - 5.11 MIL/uL   Hemoglobin 10.4 (L) 12.0 - 15.0 g/dL   HCT 21.3 (L) 08.6 - 57.8 %   MCV 82.9 80.0 - 100.0 fL   MCH 28.7 26.0 - 34.0 pg   MCHC 34.7 30.0 - 36.0 g/dL   RDW 46.9 62.9 - 52.8 %   Platelets 242 150 - 400 K/uL   nRBC 0.0 0.0 - 0.2 %   Neutrophils Relative % 56 %   Neutro Abs 3.1 1.7 - 7.7 K/uL   Lymphocytes Relative 33 %   Lymphs Abs 1.8 0.7 - 4.0 K/uL   Monocytes Relative 10 %   Monocytes Absolute 0.5 0.1 - 1.0 K/uL   Eosinophils Relative 1 %   Eosinophils Absolute 0.0 0.0 - 0.5 K/uL   Basophils Relative 0 %   Basophils Absolute 0.0 0.0 - 0.1 K/uL   Immature Granulocytes 0 %   Abs Immature Granulocytes 0.01 0.00 - 0.07 K/uL    Comment: Performed at [facility], [address] [place], [place] [address]  Comprehensive metabolic panel     Status: Abnormal   Collection Time: 07/18/22  1:55 PM  Result Value Ref Range   Sodium 137 135 - 145 mmol/L   Potassium 3.5 3.5 - 5.1 mmol/L   Chloride 101 98 - 111 mmol/L   CO2 23 22 - 32 mmol/L   Glucose, Bld 87 70 - 99 mg/dL    Comment: Glucose reference range applies only to samples taken after fasting for at least 8 hours.   BUN 15 6 - 20 mg/dL   Creatinine, Ser 4.01 0.44 - 1.00 mg/dL   Calcium 8.6 (L) 8.9 - 10.3 mg/dL   Total  Protein 7.5 6.5 - 8.1 g/dL   Albumin 3.7 3.5 - 5.0 g/dL   AST 24 15 - 41 U/L   ALT 20 0 - 44 U/L   Alkaline Phosphatase 72 38 - 126 U/L   Total Bilirubin 0.5 0.3 - 1.2 mg/dL   GFR, Estimated >02 >72 mL/min    Comment: (NOTE) Calculated using the CKD-EPI Creatinine Equation (2021)    Anion gap 13 5 - 15    Comment: Performed at [facility], [address] [place], [place] [address]  I-Stat beta hCG blood, ED     Status: None   Collection Time: 07/18/22  1:55 PM  Result Value Ref Range   I-stat hCG, quantitative <5.0 <5 mIU/mL   Comment 3            Comment:   GEST. AGE      CONC.  (mIU/mL)   <=1 WEEK        5 - 50     2 WEEKS  50 - 500     3 WEEKS       100 - 10,000     4 WEEKS     1,000 - 30,000        FEMALE AND NON-PREGNANT FEMALE:     LESS THAN 5 mIU/mL   Reticulocytes     Status: Abnormal   Collection Time: 07/18/22  1:55 PM  Result Value Ref Range   Retic Ct Pct 1.2 0.4 - 3.1 %   RBC. 3.43 (L) 3.87 - 5.11 MIL/uL   Retic Count, Absolute 41.2 19.0 - 186.0 K/uL   Immature Retic Fract 10.7 2.3 - 15.9 %    Comment: Performed at [facility], [address] [place], [place] [address]        Garner Nash, MD, MS

## 2022-09-09 ENCOUNTER — Encounter: Payer: Self-pay | Admitting: Family Medicine

## 2022-09-09 NOTE — Assessment & Plan Note (Addendum)
Mild elevation in blood pressure   Plan: Continue lisinopril 10 mg and hydrochlorothiazide 25 mg Increase amlodipine to 5 mg. Monitor blood pressure and reassess in 3 months

## 2022-09-09 NOTE — Assessment & Plan Note (Signed)
Mild improvement, but persistent recurrence of symptoms   Plan: Increase amlodipine 5 mg and monitor symptom response Refer to rheumatology for evaluation, including possible capillaroscopy. Patient advised to document episodes via video for better clinical understanding.

## 2022-09-09 NOTE — Assessment & Plan Note (Signed)
Recurrence of postmenopausal bleeding requires attention to exclude oncological etiology.  Plan: Urgent referral to OBGYN for examination and potential additional diagnostic procedures.

## 2022-10-08 ENCOUNTER — Other Ambulatory Visit: Payer: Self-pay

## 2022-10-08 ENCOUNTER — Encounter (HOSPITAL_COMMUNITY): Payer: Self-pay

## 2022-10-08 ENCOUNTER — Emergency Department (HOSPITAL_COMMUNITY): Payer: 59

## 2022-10-08 ENCOUNTER — Observation Stay (HOSPITAL_COMMUNITY)
Admission: EM | Admit: 2022-10-08 | Discharge: 2022-10-10 | Disposition: A | Discharge status: Home / Self Care | Payer: 59 | Attending: Internal Medicine | Admitting: Internal Medicine

## 2022-10-08 DIAGNOSIS — R9431 Abnormal electrocardiogram [ECG] [EKG]: Secondary | ICD-10-CM

## 2022-10-08 DIAGNOSIS — I4581 Long QT syndrome: Secondary | ICD-10-CM | POA: Diagnosis not present

## 2022-10-08 DIAGNOSIS — Z1152 Encounter for screening for COVID-19: Secondary | ICD-10-CM | POA: Diagnosis not present

## 2022-10-08 DIAGNOSIS — Z87891 Personal history of nicotine dependence: Secondary | ICD-10-CM | POA: Insufficient documentation

## 2022-10-08 DIAGNOSIS — E876 Hypokalemia: Secondary | ICD-10-CM

## 2022-10-08 DIAGNOSIS — Z79899 Other long term (current) drug therapy: Secondary | ICD-10-CM | POA: Diagnosis not present

## 2022-10-08 DIAGNOSIS — R55 Syncope and collapse: Secondary | ICD-10-CM | POA: Diagnosis present

## 2022-10-08 LAB — COMPREHENSIVE METABOLIC PANEL
ALT: 17 U/L (ref 0–44)
AST: 25 U/L (ref 15–41)
Albumin: 3.2 g/dL — ABNORMAL LOW (ref 3.5–5.0)
Alkaline Phosphatase: 43 U/L (ref 38–126)
Anion gap: 7 (ref 5–15)
BUN: 17 mg/dL (ref 6–20)
CO2: 21 mmol/L — ABNORMAL LOW (ref 22–32)
Calcium: 7.8 mg/dL — ABNORMAL LOW (ref 8.9–10.3)
Chloride: 110 mmol/L (ref 98–111)
Creatinine, Ser: 0.81 mg/dL (ref 0.44–1.00)
GFR, Estimated: >60 mL/min (ref >60)
Glucose, Bld: 89 mg/dL (ref 70–99)
Potassium: 2.7 mmol/L — CL (ref 3.5–5.1)
Sodium: 138 mmol/L (ref 135–145)
Total Bilirubin: 0.2 mg/dL — ABNORMAL LOW (ref 0.3–1.2)
Total Protein: 6.2 g/dL — ABNORMAL LOW (ref 6.5–8.1)

## 2022-10-08 LAB — CBC WITH DIFFERENTIAL/PLATELET
Abs Immature Granulocytes: 0.01 10*3/uL (ref 0.00–0.07)
Basophils Absolute: 0 10*3/uL (ref 0.0–0.1)
Basophils Relative: 0 %
Eosinophils Absolute: 0 10*3/uL (ref 0.0–0.5)
Eosinophils Relative: 1 %
HCT: 25.6 % — ABNORMAL LOW (ref 36.0–46.0)
Hemoglobin: 8.9 g/dL — ABNORMAL LOW (ref 12.0–15.0)
Immature Granulocytes: 0 %
Lymphocytes Relative: 47 %
Lymphs Abs: 3.5 10*3/uL (ref 0.7–4.0)
MCH: 29.3 pg (ref 26.0–34.0)
MCHC: 34.8 g/dL (ref 30.0–36.0)
MCV: 84.2 fL (ref 80.0–100.0)
Monocytes Absolute: 0.8 10*3/uL (ref 0.1–1.0)
Monocytes Relative: 10 %
Neutro Abs: 3.1 10*3/uL (ref 1.7–7.7)
Neutrophils Relative %: 42 %
Platelets: 218 10*3/uL (ref 150–400)
RBC: 3.04 MIL/uL — ABNORMAL LOW (ref 3.87–5.11)
RDW: 13 % (ref 11.5–15.5)
WBC: 7.4 10*3/uL (ref 4.0–10.5)
nRBC: 0 % (ref 0.0–0.2)

## 2022-10-08 LAB — MAGNESIUM: Magnesium: 2.2 mg/dL (ref 1.7–2.4)

## 2022-10-08 LAB — ETHANOL: Alcohol, Ethyl (B): 14 mg/dL — ABNORMAL HIGH (ref <10)

## 2022-10-08 MED ORDER — ONDANSETRON HCL 4 MG/2ML IJ SOLN
4.0000 mg | Freq: Once | INTRAMUSCULAR | Status: AC
  Administered 2022-10-08: 4 mg via INTRAVENOUS
  Filled 2022-10-08: qty 2

## 2022-10-08 MED ORDER — POTASSIUM CHLORIDE 10 MEQ/100ML IV SOLN
10.0000 meq | Freq: Once | INTRAVENOUS | Status: AC
  Administered 2022-10-09: 10 meq via INTRAVENOUS
  Filled 2022-10-08: qty 100

## 2022-10-08 MED ORDER — SODIUM CHLORIDE 0.9 % IV BOLUS
1000.0000 mL | Freq: Once | INTRAVENOUS | Status: AC
  Administered 2022-10-08: 1000 mL via INTRAVENOUS

## 2022-10-08 MED ORDER — POTASSIUM CHLORIDE CRYS ER 20 MEQ PO TBCR
40.0000 meq | EXTENDED_RELEASE_TABLET | Freq: Once | ORAL | Status: AC
  Administered 2022-10-09: 40 meq via ORAL
  Filled 2022-10-08: qty 4

## 2022-10-08 NOTE — ED Triage Notes (Signed)
Pt. BIB [facility] from a bar. Pt. Felt a cramp in her leg. When she stood up, she had syncopal episode. She was assisted to the ground. When she was being helped to the standing position, she had another syncopal episode. Pt. Denies pain at this time. VSS with EMS given 500 ml NS with EMS.

## 2022-10-08 NOTE — ED Provider Notes (Signed)
[facility] AT [facility] Provider Note   CSN: [id number] Arrival date & time: 10/08/22  2216     History {Add pertinent medical, surgical, social history, OB history to HPI:1} No chief complaint on file.   Kamylle Didier is a 62 y.o. female.  The history is provided by the patient and medical records.   62 y.o. F with hx of HTN, anemia, GERD, presenting to the ED after syncopal event.  She was out with friends today at [employer] for an early birthday celebration.  States she had 1 drink and was sipping on another when she got an intense cramp in her leg.  She stood up to try to stretch to side and feels like her knee buckled causing her to have a syncopal event.  She was lowered to the floor by her friends and did not experience any head trauma.  When she tried to stand back up she had subsequent syncopal event.  She never had any chest pain or shortness of breath.  Currently she feels okay, just a little lightheaded when she tries to stand up.  She does have history of syncope in the past but over 30 years ago.  She did eat and drink fairly normal today, had breakfast but did skip lunch.  She has been drinking fluids.  History of anemia and recently finished menstrual cycle so unsure if that is contributing. No prior cardiac hx, reports LHC a few years back was normal.  Home Medications Prior to Admission medications   Medication Sig Start Date End Date Taking? Authorizing Provider  amLODipine (NORVASC) 5 MG tablet Take 1 tablet (5 mg total) by mouth daily. 09/08/22 12/07/22  Garnette Gunner, MD  cyclobenzaprine (FLEXERIL) 5 MG tablet TAKE 1-2 TABLETS (5-10 MG TOTAL) BY MOUTH AT BEDTIME. 08/22/22   Garnette Gunner, MD  diclofenac Sodium (VOLTAREN) 1 % GEL APPLY 4 G TOPICALLY 4 (FOUR) TIMES DAILY AS NEEDED. 08/23/22   Garnette Gunner, MD  dicyclomine (BENTYL) 20 MG tablet Take 1 tablet (20 mg total) by mouth in the morning and at bedtime. 07/18/22    Margaretann Loveless, PA-C  famotidine (PEPCID) 40 MG tablet TAKE 1 TABLET BY MOUTH EVERYDAY AT BEDTIME 05/24/22   McElwee, Lauren A, NP  hydrochlorothiazide (HYDRODIURIL) 25 MG tablet Take 1 tablet (25 mg total) by mouth daily. 04/28/22   Garnette Gunner, MD  lisinopril (ZESTRIL) 10 MG tablet Take 1 tablet (10 mg total) by mouth daily. 07/11/22   Garnette Gunner, MD  montelukast (SINGULAIR) 10 MG tablet Take 1 tablet (10 mg total) by mouth at bedtime. 09/08/22 12/07/22  Garnette Gunner, MD  pantoprazole (PROTONIX) 40 MG tablet 1 tablet 12/26/14   [provider]  rosuvastatin (CRESTOR) 10 MG tablet Take 1 tablet (10 mg total) by mouth daily. 09/07/22   Garnette Gunner, MD      Allergies    Iodinated contrast media, Iodine, Morphine and codeine, Other, Sulfa antibiotics, and Sulfamethoxazole-trimethoprim    Review of Systems   Review of Systems  Neurological:  Positive for syncope.  All other systems reviewed and are negative.   Physical Exam Updated Vital Signs BP 128/78   Pulse 79   Temp (!) 97.5 F (36.4 C) (Oral)   Resp 16   Wt 68.9 kg   SpO2 100%   BMI 22.45 kg/m  Physical Exam Vitals and nursing note reviewed.  Constitutional:      Appearance: She is well-developed.  HENT:     Head: Normocephalic and atraumatic.  Eyes:     Conjunctiva/sclera: Conjunctivae normal.     Pupils: Pupils are equal, round, and reactive to light.  Cardiovascular:     Rate and Rhythm: Normal rate and regular rhythm.     Heart sounds: Normal heart sounds.  Pulmonary:     Effort: Pulmonary effort is normal.     Breath sounds: Normal breath sounds.  Abdominal:     General: Bowel sounds are normal.     Palpations: Abdomen is soft.  Musculoskeletal:        General: Normal range of motion.     Cervical back: Normal range of motion.  Skin:    General: Skin is warm and dry.  Neurological:     Mental Status: She is alert and oriented to person, place, and time.     Comments: AAOx3,  answering questions and following commands appropriately; equal strength UE and LE bilaterally; CN grossly intact; moves all extremities appropriately without ataxia; no focal neuro deficits or facial asymmetry appreciated     ED Results / Procedures / Treatments   Labs (all labs ordered are listed, but only abnormal results are displayed) Labs Reviewed  CBC WITH DIFFERENTIAL/PLATELET  MAGNESIUM  COMPREHENSIVE METABOLIC PANEL  ETHANOL    EKG None  Radiology No results found.  Procedures Procedures  {Document cardiac monitor, telemetry assessment procedure when appropriate:1}  Medications Ordered in ED Medications  sodium chloride 0.9 % bolus 1,000 mL (has no administration in time range)    ED Course/ Medical Decision Making/ A&P   {   Click here for ABCD2, HEART and other calculatorsREFRESH Note before signing :1}                          Medical Decision Making Amount and/or Complexity of Data Reviewed Labs: ordered. Radiology: ordered. ECG/medicine tests: ordered.  Risk Prescription drug management.   ***  11:41 PM  K+ is low at 2.7.  This may explain her leg cramps.  Mg+ is normal.  Will give IV and PO replacement. Final Clinical Impression(s) / ED Diagnoses Final diagnoses:  None    Rx / DC Orders ED Discharge Orders     None

## 2022-10-09 ENCOUNTER — Observation Stay (HOSPITAL_BASED_OUTPATIENT_CLINIC_OR_DEPARTMENT_OTHER): Payer: 59

## 2022-10-09 DIAGNOSIS — E782 Mixed hyperlipidemia: Secondary | ICD-10-CM

## 2022-10-09 DIAGNOSIS — E876 Hypokalemia: Secondary | ICD-10-CM

## 2022-10-09 DIAGNOSIS — R55 Syncope and collapse: Secondary | ICD-10-CM | POA: Diagnosis not present

## 2022-10-09 DIAGNOSIS — R9431 Abnormal electrocardiogram [ECG] [EKG]: Secondary | ICD-10-CM | POA: Diagnosis not present

## 2022-10-09 DIAGNOSIS — I10 Essential (primary) hypertension: Secondary | ICD-10-CM | POA: Diagnosis not present

## 2022-10-09 LAB — SARS CORONAVIRUS 2 BY RT PCR: SARS Coronavirus 2 by RT PCR: NEGATIVE

## 2022-10-09 LAB — CBC WITH DIFFERENTIAL/PLATELET
Abs Immature Granulocytes: 0.02 10*3/uL (ref 0.00–0.07)
Basophils Absolute: 0 10*3/uL (ref 0.0–0.1)
Basophils Relative: 0 %
Eosinophils Absolute: 0 10*3/uL (ref 0.0–0.5)
Eosinophils Relative: 0 %
HCT: 25.6 % — ABNORMAL LOW (ref 36.0–46.0)
Hemoglobin: 9.1 g/dL — ABNORMAL LOW (ref 12.0–15.0)
Immature Granulocytes: 0 %
Lymphocytes Relative: 37 %
Lymphs Abs: 2.5 10*3/uL (ref 0.7–4.0)
MCH: 30.1 pg (ref 26.0–34.0)
MCHC: 35.5 g/dL (ref 30.0–36.0)
MCV: 84.8 fL (ref 80.0–100.0)
Monocytes Absolute: 0.6 10*3/uL (ref 0.1–1.0)
Monocytes Relative: 9 %
Neutro Abs: 3.6 10*3/uL (ref 1.7–7.7)
Neutrophils Relative %: 54 %
Platelets: 229 10*3/uL (ref 150–400)
RBC: 3.02 MIL/uL — ABNORMAL LOW (ref 3.87–5.11)
RDW: 12.9 % (ref 11.5–15.5)
WBC: 6.7 10*3/uL (ref 4.0–10.5)
nRBC: 0 % (ref 0.0–0.2)

## 2022-10-09 LAB — RETICULOCYTES
Immature Retic Fract: 9.5 % (ref 2.3–15.9)
RBC.: 3.29 MIL/uL — ABNORMAL LOW (ref 3.87–5.11)
Retic Count, Absolute: 32.9 10*3/uL (ref 19.0–186.0)
Retic Ct Pct: 1 % (ref 0.4–3.1)

## 2022-10-09 LAB — RAPID URINE DRUG SCREEN, HOSP PERFORMED
Amphetamines: NOT DETECTED
Barbiturates: NOT DETECTED
Benzodiazepines: NOT DETECTED
Cocaine: NOT DETECTED
Opiates: NOT DETECTED
Tetrahydrocannabinol: POSITIVE — AB

## 2022-10-09 LAB — VITAMIN B12: Vitamin B-12: 311 pg/mL (ref 180–914)

## 2022-10-09 LAB — BASIC METABOLIC PANEL
Anion gap: 7 (ref 5–15)
BUN: 16 mg/dL (ref 6–20)
CO2: 24 mmol/L (ref 22–32)
Calcium: 8.2 mg/dL — ABNORMAL LOW (ref 8.9–10.3)
Chloride: 107 mmol/L (ref 98–111)
Creatinine, Ser: 0.78 mg/dL (ref 0.44–1.00)
GFR, Estimated: >60 mL/min (ref >60)
Glucose, Bld: 107 mg/dL — ABNORMAL HIGH (ref 70–99)
Potassium: 4 mmol/L (ref 3.5–5.1)
Sodium: 138 mmol/L (ref 135–145)

## 2022-10-09 LAB — FERRITIN: Ferritin: 180 ng/mL (ref 11–307)

## 2022-10-09 LAB — FOLATE: Folate: 5.4 ng/mL — ABNORMAL LOW (ref >5.9)

## 2022-10-09 LAB — ECHOCARDIOGRAM COMPLETE
Area-P 1/2: 3.43 cm2
Calc EF: 65.4 %
Height: 69 in
S' Lateral: 2.7 cm
Single Plane A2C EF: 70.4 %
Single Plane A4C EF: 60.6 %
Weight: 2483.26 oz

## 2022-10-09 LAB — IRON AND TIBC
Iron: 59 ug/dL (ref 28–170)
Saturation Ratios: 18 % (ref 10.4–31.8)
TIBC: 335 ug/dL (ref 250–450)
UIBC: 276 ug/dL

## 2022-10-09 MED ORDER — ACETAMINOPHEN 325 MG PO TABS
650.0000 mg | ORAL_TABLET | Freq: Four times a day (QID) | ORAL | Status: DC | PRN
  Filled 2022-10-09: qty 2

## 2022-10-09 MED ORDER — POTASSIUM CHLORIDE CRYS ER 20 MEQ PO TBCR: 40.0000 meq | EXTENDED_RELEASE_TABLET | Freq: Once | ORAL | Status: DC

## 2022-10-09 MED ORDER — POTASSIUM CHLORIDE CRYS ER 20 MEQ PO TBCR
40.0000 meq | EXTENDED_RELEASE_TABLET | ORAL | Status: AC
  Administered 2022-10-09 (×2): 40 meq via ORAL
  Filled 2022-10-09 (×2): qty 2

## 2022-10-09 MED ORDER — SENNOSIDES-DOCUSATE SODIUM 8.6-50 MG PO TABS: 1.0000 | ORAL_TABLET | Freq: Every evening | ORAL | Status: DC | PRN

## 2022-10-09 MED ORDER — POTASSIUM CHLORIDE IN NACL 20-0.9 MEQ/L-% IV SOLN
INTRAVENOUS | Status: DC
  Filled 2022-10-09 (×3): qty 1000

## 2022-10-09 MED ORDER — ENOXAPARIN SODIUM 40 MG/0.4ML IJ SOSY
40.0000 mg | PREFILLED_SYRINGE | INTRAMUSCULAR | Status: DC
  Administered 2022-10-09: 40 mg via SUBCUTANEOUS
  Filled 2022-10-09: qty 0.4

## 2022-10-09 MED ORDER — ACETAMINOPHEN 650 MG RE SUPP: 650.0000 mg | Freq: Four times a day (QID) | RECTAL | Status: DC | PRN

## 2022-10-09 NOTE — Progress Notes (Signed)
Echocardiogram 2D Echocardiogram has been performed.  Toni Amend 10/09/2022, 10:17 AM

## 2022-10-09 NOTE — TOC CM/SW Note (Signed)
  Transition of Care [facility]) Screening Note   Patient Details  Name: Barbara [name] Date of Birth: October 28, 1960   Transition of Care [facility]) CM/SW Contact:    Howell Rucks, RN Phone Number: 10/09/2022, 4:07 PM    Transition of Care Department [facility]) has reviewed patient and no TOC needs have been identified at this time. We will continue to monitor patient advancement through interdisciplinary progression rounds. If new patient transition needs arise, please place a TOC consult.

## 2022-10-09 NOTE — Progress Notes (Signed)
Carotid duplex bilateral study completed.   Please see CV Proc for preliminary results.   Sutter Ahlgren, RDMS, RVT  

## 2022-10-09 NOTE — Progress Notes (Signed)
[facility] PROGRESS NOTE    Progress Note  Barbara [name]  ZOX:[id number] DOB: December 18, 1960 DOA: 10/08/2022 PCP: Garnette Gunner, MD     Brief Narrative:   Barbara [name] is an 62 y.o. female with past medical history significant for essential hypertension who comes in for syncopal episode, brief loss of consciousness no seizure activity no postictal state no loss of control sphincters.  She did not hit her head, she relate her left heart cath 10 years ago was clean the only new medication it was as hydrochlorothiazide was started on February  Assessment/Plan:   Syncope and collapse: Of unclear etiology, orthostatic vitals were negative. She is mildly hypokalemic on admission which was repleted now resolved. Twelve-lead EKG showed normal sinus rhythm normal axis T wave inversions in the inferior leads no reciprocal changes. 2D echo a have been ordered.  Hypokalemia: Repleted IV now resolved.  Prolonged QT interval Likely due to hypokalemia now repleted correctly potassium greater than 4 magnesium greater than 2. Magnesium checked yesterday was 2.2.  Normocytic anemia: Check an anemia panel follow-up with PCP as an outpatient. She denies any melanotic stools or bright red blood per rectum no signs of overt bleeding.     DVT prophylaxis: lovenox Family Communication:none Status is: Observation The patient remains OBS appropriate and will d/c before 2 midnights.    Code Status:     Code Status Orders  (From admission, onward)           Start     Ordered   10/09/22 0237  Full code  Continuous       Question:  By:  Answer:  Consent: discussion documented in EHR   10/09/22 0237           Code Status History     Date Active Date Inactive Code Status Order ID Comments User Context   08/27/2014 1740 08/28/2014 1421 Full Code [id number]  Iran Ouch, MD Inpatient   08/26/2014 2205 08/27/2014 1740 Full Code [id number]  Hillary Bow, DO ED         IV  Access:   Peripheral IV   Procedures and diagnostic studies:   DG Chest Port 1 View  Result Date: 10/08/2022 CLINICAL DATA:  Syncope EXAM: PORTABLE CHEST 1 VIEW COMPARISON:  Chest radiographs 12/13/2021 FINDINGS: Stable cardiomediastinal silhouette. No focal consolidation, pleural effusion, or pneumothorax. No displaced rib fractures. IMPRESSION: No active disease. Electronically Signed   By: Minerva Fester M.D.   On: 10/08/2022 23:09     Medical Consultants:   None.   Subjective:    Alianys Labella no complains  Objective:    Vitals:   10/08/22 2226 10/09/22 0227 10/09/22 0336 10/09/22 0527  BP: 128/78 113/74 106/88   Pulse: 79 85 74   Resp: 16 19 16    Temp: (!) 97.5 F (36.4 C) 97.8 F (36.6 C) 98.6 F (37 C)   TempSrc: Oral Oral Oral   SpO2: 100% 100% 100%   Weight:    70.4 kg  Height:    5\' 9"  (1.753 m)   SpO2: 100 %   Intake/Output Summary (Last 24 hours) at 10/09/2022 0754 Last data filed at 10/09/2022 0749 Gross per 24 hour  Intake 379.03 ml  Output --  Net 379.03 ml   Filed Weights   10/08/22 2223 10/09/22 0527  Weight: 68.9 kg 70.4 kg    Exam: General exam: In no acute distress. Respiratory system: Good air movement and clear to auscultation. Cardiovascular system: S1 & S2  heard, RRR. No JVD. Gastrointestinal system: Abdomen is nondistended, soft and nontender.  Extremities: No pedal edema. Skin: No rashes, lesions or ulcers Psychiatry: Judgement and insight appear normal. Mood & affect appropriate.    Data Reviewed:    Labs: Basic Metabolic Panel: Recent Labs  Lab 10/08/22 2239 10/09/22 0402  NA 138 138  K 2.7* 4.0  CL 110 107  CO2 21* 24  GLUCOSE 89 107*  BUN 17 16  CREATININE 0.81 0.78  CALCIUM 7.8* 8.2*  MG 2.2  --    GFR Estimated Creatinine Clearance: 78.2 mL/min (by C-G formula based on SCr of 0.78 mg/dL). Liver Function Tests: Recent Labs  Lab 10/08/22 2239  AST 25  ALT 17  ALKPHOS 43  BILITOT 0.2*  PROT 6.2*   ALBUMIN 3.2*   No results for input(s): "LIPASE", "AMYLASE" in the last 168 hours. No results for input(s): "AMMONIA" in the last 168 hours. Coagulation profile No results for input(s): "INR", "PROTIME" in the last 168 hours. COVID-19 Labs  No results for input(s): "DDIMER", "FERRITIN", "LDH", "CRP" in the last 72 hours.  Lab Results  Component Value Date   SARSCOV2NAA NEGATIVE 10/09/2022   SARSCOV2NAA NEGATIVE 12/13/2021    CBC: Recent Labs  Lab 10/08/22 2239 10/09/22 0402  WBC 7.4 6.7  NEUTROABS 3.1 3.6  HGB 8.9* 9.1*  HCT 25.6* 25.6*  MCV 84.2 84.8  PLT 218 229   Cardiac Enzymes: No results for input(s): "CKTOTAL", "CKMB", "CKMBINDEX", "TROPONINI" in the last 168 hours. BNP (last 3 results) No results for input(s): "PROBNP" in the last 8760 hours. CBG: No results for input(s): "GLUCAP" in the last 168 hours. D-Dimer: No results for input(s): "DDIMER" in the last 72 hours. Hgb A1c: No results for input(s): "HGBA1C" in the last 72 hours. Lipid Profile: No results for input(s): "CHOL", "HDL", "LDLCALC", "TRIG", "CHOLHDL", "LDLDIRECT" in the last 72 hours. Thyroid function studies: No results for input(s): "TSH", "T4TOTAL", "T3FREE", "THYROIDAB" in the last 72 hours.  Invalid input(s): "FREET3" Anemia work up: No results for input(s): "VITAMINB12", "FOLATE", "FERRITIN", "TIBC", "IRON", "RETICCTPCT" in the last 72 hours. Sepsis Labs: Recent Labs  Lab 10/08/22 2239 10/09/22 0402  WBC 7.4 6.7   Microbiology Recent Results (from the past 240 hour(s))  SARS Coronavirus 2 by RT PCR (hospital order, performed in [facility] hospital lab) *cepheid single result test* Anterior Nasal Swab     Status: None   Collection Time: 10/09/22  2:30 AM   Specimen: Anterior Nasal Swab  Result Value Ref Range Status   SARS Coronavirus 2 by RT PCR NEGATIVE NEGATIVE Final    Comment: (NOTE) SARS-CoV-2 target nucleic acids are NOT DETECTED.  The SARS-CoV-2 RNA is generally  detectable in upper and lower respiratory specimens during the acute phase of infection. The lowest concentration of SARS-CoV-2 viral copies this assay can detect is 250 copies / mL. A negative result does not preclude SARS-CoV-2 infection and should not be used as the sole basis for treatment or other patient management decisions.  A negative result may occur with improper specimen collection / handling, submission of specimen other than nasopharyngeal swab, presence of viral mutation(s) within the areas targeted by this assay, and inadequate number of viral copies (<250 copies / mL). A negative result must be combined with clinical observations, patient history, and epidemiological information.  Fact Sheet for Patients:   RoadLapTop.co.za  Fact Sheet for Healthcare Providers: http://kim-miller.com/  This test is not yet approved or  cleared by the [place] FDA  and has been authorized for detection and/or diagnosis of SARS-CoV-2 by FDA under an Emergency Use Authorization (EUA).  This EUA will remain in effect (meaning this test can be used) for the duration of the COVID-19 declaration under Section 564(b)(1) of the Act, 21 U.S.C. section 360bbb-3(b)(1), unless the authorization is terminated or revoked sooner.  Performed at [facility], [address], [place], [place] [address]      Medications:    enoxaparin (LOVENOX) injection  40 mg Subcutaneous Q24H   Continuous Infusions:  0.9 % NaCl with KCl 20 mEq / L 100 mL/hr at 10/09/22 0400      LOS: 0 days   Marinda Elk  [facility]  10/09/2022, 7:54 AM

## 2022-10-09 NOTE — Consult Note (Signed)
Cardiology Consultation   Patient ID: Barbara [name] MRN: [id number]; DOB: 1960/10/31  Admit date: 10/08/2022 Date of Consult: 10/09/2022  PCP:  Garnette Gunner, MD   [facility] Providers Cardiologist:  Maisie Fus, MD        Patient Profile:   Barbara [name] is a 62 y.o. female with a hx of HTN, IDA,GERD, sickle cell trait, FH premature CAD, Hx CP s/p cath 2016 w/out sig dz & neg GXT Echo 2019, who is being seen 10/09/2022 for the evaluation of syncope at the request of Dr David Stall.  History of Present Illness:   Barbara [name] was seen by Dr Wyline Mood 01/03/2022 for chest pain. MSK pain by exam. No further eval.   Yesterday, she had sudden cramping in her lower thigh. When she stood to stretch it out, she felt dizzy and fainted twice. The faints lasted seconds only. No incontinence of bowel or bladder, no seizure activity or post-ictal state. Her daughter was there and prevented her from falling. No acute illness or exposures.  This has not happened before. She has no cardiac dx hx.   Of note, she had been started on HCTZ 06/2022 and her K+ on admit was 2.7, Mg 2.2, neg trop.    Past Medical History:  Diagnosis Date   GERD (gastroesophageal reflux disease)    Hypertension    Low iron    Sickle cell trait (HCC)    Viral gastroenteritis 07/24/2022    Past Surgical History:  Procedure Laterality Date   ANTERIOR CRUCIATE LIGAMENT REPAIR     CHOLECYSTECTOMY     ENDOMETRIAL ABLATION     LEFT HEART CATHETERIZATION WITH CORONARY ANGIOGRAM N/A 08/27/2014   Procedure: LEFT HEART CATHETERIZATION WITH CORONARY ANGIOGRAM;  Surgeon: Iran Ouch, MD;  Location: [facility] CATH [facility];  Service: Cardiovascular;  Laterality: N/A;     Home Medications:  Prior to Admission medications   Medication Sig Start Date End Date Taking? Authorizing Provider  amLODipine (NORVASC) 5 MG tablet Take 1 tablet (5 mg total) by mouth daily. 09/08/22 12/07/22 Yes Garnette Gunner, MD   cyclobenzaprine (FLEXERIL) 5 MG tablet TAKE 1-2 TABLETS (5-10 MG TOTAL) BY MOUTH AT BEDTIME. Patient taking differently: Take 5-10 mg by mouth at bedtime as needed for muscle spasms. 08/22/22  Yes Garnette Gunner, MD  diclofenac Sodium (VOLTAREN) 1 % GEL APPLY 4 G TOPICALLY 4 (FOUR) TIMES DAILY AS NEEDED. Patient taking differently: Apply 4 g topically 4 (four) times daily as needed (pain). 08/23/22  Yes Garnette Gunner, MD  dicyclomine (BENTYL) 20 MG tablet Take 1 tablet (20 mg total) by mouth in the morning and at bedtime. 07/18/22  Yes Margaretann Loveless, PA-C  famotidine (PEPCID) 40 MG tablet TAKE 1 TABLET BY MOUTH EVERYDAY AT BEDTIME 05/24/22  Yes McElwee, Lauren A, NP  hydrochlorothiazide (HYDRODIURIL) 25 MG tablet Take 1 tablet (25 mg total) by mouth daily. 04/28/22  Yes Garnette Gunner, MD  lisinopril (ZESTRIL) 10 MG tablet Take 1 tablet (10 mg total) by mouth daily. 07/11/22  Yes Garnette Gunner, MD  montelukast (SINGULAIR) 10 MG tablet Take 1 tablet (10 mg total) by mouth at bedtime. 09/08/22 12/07/22 Yes Garnette Gunner, MD  pantoprazole (PROTONIX) 40 MG tablet Take 40 mg by mouth daily. 12/26/14  Yes [provider]  rosuvastatin (CRESTOR) 10 MG tablet Take 1 tablet (10 mg total) by mouth daily. 09/07/22  Yes Garnette Gunner, MD    Inpatient Medications: Scheduled Meds:  enoxaparin (LOVENOX) injection  40 mg Subcutaneous Q24H   Continuous Infusions:  0.9 % NaCl with KCl 20 mEq / L 100 mL/hr at 10/09/22 0400   PRN Meds: acetaminophen **OR** acetaminophen, senna-docusate  Allergies:    Allergies  Allergen Reactions   Iodinated Contrast Media Anaphylaxis    States he face swells when she has CT iodinated contrast   Iodine Swelling    Other reaction(s): Facial swelling   Morphine And Codeine Itching   Other Swelling    All Nut Allergies   Sulfa Antibiotics Swelling   Sulfamethoxazole-Trimethoprim Other (See Comments)    Social History:   Social History    Socioeconomic History   Marital status: Divorced    Spouse name: Not on file   Number of children: 2   Years of education: Not on file   Highest education level: Not on file  Occupational History   Not on file  Tobacco Use   Smoking status: Former    Years: 1    Types: Cigarettes    Quit date: 08/26/2013    Years since quitting: 9.1   Smokeless tobacco: Never  Vaping Use   Vaping Use: Never used  Substance and Sexual Activity   Alcohol use: Yes    Comment: occasionally, glass of wine   Drug use: No   Sexual activity: Not on file  Other Topics Concern   Not on file  Social History Narrative   Not on file   Social Determinants of Health   Financial Resource Strain: Low Risk  (07/19/2022)   Overall Financial Resource Strain (CARDIA)    Difficulty of Paying Living Expenses: Not very hard  Food Insecurity: No Food Insecurity (10/09/2022)   Hunger Vital Sign    Worried About Running Out of Food in the Last Year: Never true    Ran Out of Food in the Last Year: Never true  Transportation Needs: No Transportation Needs (10/09/2022)   PRAPARE - Administrator, Civil Service (Medical): No    Lack of Transportation (Non-Medical): No  Physical Activity: Sufficiently Active (07/19/2022)   Exercise Vital Sign    Days of Exercise per Week: 5 days    Minutes of Exercise per Session: 150+ min  Stress: No Stress Concern Present (07/19/2022)   [employer] of Occupational Health - Occupational Stress Questionnaire    Feeling of Stress : Not at all  Social Connections: Not on file  Intimate Partner Violence: Not At Risk (10/09/2022)   Humiliation, Afraid, Rape, and Kick questionnaire    Fear of Current or Ex-Partner: No    Emotionally Abused: No    Physically Abused: No    Sexually Abused: No    Family History:  Family History  Problem Relation Age of Onset   CAD Brother 30       CABG at age 85, and deceased   CAD Sister 46       1 stent   CAD Father 16        Deceased in 23s     ROS:  Please see the history of present illness.  All other ROS reviewed and negative.     Physical Exam/Data:   Vitals:   10/08/22 2226 10/09/22 0227 10/09/22 0336 10/09/22 0527  BP: 128/78 113/74 106/88   Pulse: 79 85 74   Resp: 16 19 16    Temp: (!) 97.5 F (36.4 C) 97.8 F (36.6 C) 98.6 F (37 C)   TempSrc: Oral Oral Oral   SpO2:  100% 100% 100%   Weight:    70.4 kg  Height:    5\' 9"  (1.753 m)   No intake or output data in the 24 hours ending 10/09/22 0710    10/09/2022    5:27 AM 10/08/2022   10:23 PM 09/08/2022    9:59 AM  Last 3 Weights  Weight (lbs) 155 lb 3.3 oz 152 lb 154 lb 12.8 oz  Weight (kg) 70.4 kg 68.947 kg 70.217 kg     Body mass index is 22.92 kg/m.  General:  Well nourished, well developed, in no acute distress HEENT: normal Neck: no JVD Vascular: No carotid bruits; Distal pulses 2+ bilaterally Cardiac:  normal S1, S2; RRR; no murmur  Lungs:  clear to auscultation bilaterally, no wheezing, rhonchi or rales  Abd: soft, nontender, no hepatomegaly  Ext: no edema Musculoskeletal:  No deformities, BUE and BLE strength normal and equal Skin: warm and dry  Neuro:  CNs 2-12 intact, no focal abnormalities noted Psych:  Normal affect   EKG:  The EKG was personally reviewed and demonstrates:   05/18 ectopic atrial rhythm vs abnl leads, HR 80, P waves inverted in inf/lat and AVF leads 05/19 ectopic atrial rhythm vs abnl leads, HR 71, P waves inverted only inf leads and AVF (Prev ECGs clearly SR)  Telemetry:  Telemetry was personally reviewed and demonstrates:  ectopic rhythm, sinus rhythm. No AV block  Relevant CV Studies:  ECHO: ordered  CARDIAC CATH: 08/27/2014 Procedural Findings:  Hemodynamics: AO:   166/91   mmHg LV:   167 over 4    mmHg LVEDP:  18  mmHg   Coronary angiography: Coronary dominance:  right   Left Main:   normal Left Anterior Descending (LAD):   Normal in size with no significant disease. 1st diagonal  (D1):   normal 2nd diagonal (D2):   Small in size with no significant disease. 3rd diagonal (D3):   Normal in size with no significant disease. Circumflex (LCx):   Medium in size and nondominant. The vessel has no significant disease. 1st obtuse marginal:   Normal in size with no significant disease. 2nd obtuse marginal:   Small in size with no significant disease. 3rd obtuse marginal:   Normal in size with no significant disease.     Right Coronary Artery:   large in size and dominant. The vessel has minor irregularities. Posterior descending artelarge in size with no significant disease. Posterior AV segment:  normal in size with no significant disease. Posterolateral branchs:   normal   Left ventriculography: Left ventricular systolic function is  normal , LVEF is estimated at  60 %, there is  no significant mitral regurgitation    Final Conclusions:    1. No significant coronary artery disease. 2. Normal LV systolic function.   Recommendations:   The chest pain is likely noncardiac and possibly GI in nature.  STRESS ECHO:  11/21/2017 Study Conclusions   - Stress ECG conclusions: There were no stress arrhythmias or   conduction abnormalities. The stress ECG was negative for   ischemia.  - Staged echo: There was no echocardiographic evidence for   stress-induced ischemia.   Impressions:  - 1. Stress echo negative for evidence of inducible ischemia.   Normal left ventricular systolic function at rest and good   augmentation of the left ventricular segments at stress.    2. Good exercise capacity and blood pressure response was normal.  -------------------------------------------------------------------  Treadmill exercise testing was performed using the Bruce protocol. The  patient exercised for 6 min 1 sec, to protocol stage 3, to a maximal work rate of 7 mets. Exercise was terminated due to achievement of target heart rate. The patient was positioned for image acquisition and  recovery monitoring. Transthoracic stress echocardiography for chest pain evaluation. Image quality was good.  Images were captured at baseline and peak exercise.  Study completion:  The patient tolerated the procedure well. There were no complications.            Laboratory Data:  High Sensitivity Troponin:  No results for input(s): "TROPONINIHS" in the last 720 hours.   Chemistry Recent Labs  Lab 10/08/22 2239 10/09/22 0402  NA 138 138  K 2.7* 4.0  CL 110 107  CO2 21* 24  GLUCOSE 89 107*  BUN 17 16  CREATININE 0.81 0.78  CALCIUM 7.8* 8.2*  MG 2.2  --   GFRNONAA >60 >60  ANIONGAP 7 7    Recent Labs  Lab 10/08/22 2239  PROT 6.2*  ALBUMIN 3.2*  AST 25  ALT 17  ALKPHOS 43  BILITOT 0.2*   Lipids No results for input(s): "CHOL", "TRIG", "HDL", "LABVLDL", "LDLCALC", "CHOLHDL" in the last 168 hours.  Hematology Recent Labs  Lab 10/08/22 2239 10/09/22 0402  WBC 7.4 6.7  RBC 3.04* 3.02*  HGB 8.9* 9.1*  HCT 25.6* 25.6*  MCV 84.2 84.8  MCH 29.3 30.1  MCHC 34.8 35.5  RDW 13.0 12.9  PLT 218 229   Thyroid No results for input(s): "TSH", "FREET4" in the last 168 hours.  BNPNo results for input(s): "BNP", "PROBNP" in the last 168 hours.  DDimer No results for input(s): "DDIMER" in the last 168 hours.   Radiology/Studies:  DG Chest Port 1 View  Result Date: 10/08/2022 CLINICAL DATA:  Syncope EXAM: PORTABLE CHEST 1 VIEW COMPARISON:  Chest radiographs 12/13/2021 FINDINGS: Stable cardiomediastinal silhouette. No focal consolidation, pleural effusion, or pneumothorax. No displaced rib fractures. IMPRESSION: No active disease. Electronically Signed   By: Minerva Fester M.D.   On: 10/08/2022 23:09     Assessment and Plan:   Vasovagal Syncope: her story is c/w vasovagal syncope. She does have an ectopic rhythm, but not contributing to syncope. Agree with repletion of potassium. Can FU her echo ordered. If this is unremarkable, then she can go home and cardiology will sign  off.  Hypokalemia - having cramps - getting potassium, K>4   Risk Assessment/Risk Scores:      For questions or updates, please contact  [facility] Please consult [email or web address] for contact info under    Maisie Fus

## 2022-10-09 NOTE — H&P (Addendum)
PCP:   Garnette Gunner, MD   Chief Complaint:  Syncope and collapse  HPI: This is a 62 year old female with past medical history of hypertension, family history of CAD.  Today she was also braiding her birthday when of a sudden she cramped in her lower thigh.  She stood to stretch it out. She felt dizzy and she fainted twice.  She was out for brief seconds.  There was no seizure activity or postictal state.  She did not fall as she was caught by her daughter.  She had no prodromal symptoms, no fevers, chills, nausea, vomiting, diarrhea, or chest pains.  She has had no sick contacts. She does have a significant family history of CAD.  Her brother a brother who died at age 22 after receiving a CABG.  Her dad who had his first MI age 54.  Her brother who just passed age of 73. She does not endorse nonspecific occasional midsternal inconsistent chest discomfort.  She had a LHC approximately 10 years ago.  She states it was normal. Her only new medication was HCTZ which was started in February.  EKG done in the ER was positive for QTc 515 which is new.  Potassium 2.7, magnesium 2.2.  Troponin is normal at 2 and 3. Admission requested.  Cardiology consult placed by EDP  Review of Systems:  Per HPI  Past Medical History: Past Medical History:  Diagnosis Date   GERD (gastroesophageal reflux disease)    Hypertension    Low iron    Sickle cell trait (HCC)    Viral gastroenteritis 07/24/2022   Past Surgical History:  Procedure Laterality Date   ANTERIOR CRUCIATE LIGAMENT REPAIR     CHOLECYSTECTOMY     ENDOMETRIAL ABLATION     LEFT HEART CATHETERIZATION WITH CORONARY ANGIOGRAM N/A 08/27/2014   Procedure: LEFT HEART CATHETERIZATION WITH CORONARY ANGIOGRAM;  Surgeon: Iran Ouch, MD;  Location: [facility]ATH [facility];  Service: Cardiovascular;  Laterality: N/A;    Medications: Prior to Admission medications   Medication Sig Start Date End Date Taking? Authorizing Provider  amLODipine (NORVASC) 5  MG tablet Take 1 tablet (5 mg total) by mouth daily. 09/08/22 12/07/22  Garnette Gunner, MD  cyclobenzaprine (FLEXERIL) 5 MG tablet TAKE 1-2 TABLETS (5-10 MG TOTAL) BY MOUTH AT BEDTIME. 08/22/22   Garnette Gunner, MD  diclofenac Sodium (VOLTAREN) 1 % GEL APPLY 4 G TOPICALLY 4 (FOUR) TIMES DAILY AS NEEDED. 08/23/22   Garnette Gunner, MD  dicyclomine (BENTYL) 20 MG tablet Take 1 tablet (20 mg total) by mouth in the morning and at bedtime. 07/18/22   Margaretann Loveless, PA-C  famotidine (PEPCID) 40 MG tablet TAKE 1 TABLET BY MOUTH EVERYDAY AT BEDTIME 05/24/22   McElwee, Lauren A, NP  hydrochlorothiazide (HYDRODIURIL) 25 MG tablet Take 1 tablet (25 mg total) by mouth daily. 04/28/22   Garnette Gunner, MD  lisinopril (ZESTRIL) 10 MG tablet Take 1 tablet (10 mg total) by mouth daily. 07/11/22   Garnette Gunner, MD  montelukast (SINGULAIR) 10 MG tablet Take 1 tablet (10 mg total) by mouth at bedtime. 09/08/22 12/07/22  Garnette Gunner, MD  pantoprazole (PROTONIX) 40 MG tablet 1 tablet 12/26/14   [provider]  rosuvastatin (CRESTOR) 10 MG tablet Take 1 tablet (10 mg total) by mouth daily. 09/07/22   Garnette Gunner, MD    Allergies:   Allergies  Allergen Reactions   Iodinated Contrast Media Anaphylaxis    States he face swells when  she has CT iodinated contrast   Iodine Swelling    Other reaction(s): Facial swelling   Morphine And Codeine Itching   Other Swelling    All Nut Allergies   Sulfa Antibiotics Swelling   Sulfamethoxazole-Trimethoprim Other (See Comments)    Social History:  reports that she quit smoking about 9 years ago. Her smoking use included cigarettes. She has never used smokeless tobacco. She reports current alcohol use. She reports that she does not use drugs.  Family History: Family History  Problem Relation Age of Onset   CAD Brother 25       CABG at age 22, and deceased   CAD Sister 56       1 stent   CAD Father 62       Deceased in 42s    Physical  Exam: Vitals:   10/08/22 2223 10/08/22 2226 10/09/22 0227  BP:  128/78 113/74  Pulse:  79 85  Resp:  16 19  Temp:  (!) 97.5 F (36.4 C) 97.8 F (36.6 C)  TempSrc:  Oral Oral  SpO2:  100% 100%  Weight: 68.9 kg      General:  Alert and oriented times three, well developed and nourished, no acute distress Eyes: PERRLA, pink conjunctiva, no scleral icterus ENT: Moist oral mucosa, neck supple, no thyromegaly Lungs: clear to ascultation, no wheeze, no crackles, no use of accessory muscles Cardiovascular: regular rate and rhythm, no regurgitation, no gallops, no murmurs. No carotid bruits, no JVD Abdomen: soft, positive BS, non-tender, non-distended, no organomegaly, not an acute abdomen GU: not examined Neuro: CN II - XII grossly intact, sensation intact Musculoskeletal: strength 5/5 all extremities, no clubbing, cyanosis or edema Skin: no rash, no subcutaneous crepitation, no decubitus Psych: appropriate patient   Labs on Admission:  Recent Labs    10/08/22 2239  NA 138  K 2.7*  CL 110  CO2 21*  GLUCOSE 89  BUN 17  CREATININE 0.81  CALCIUM 7.8*  MG 2.2   Recent Labs    10/08/22 2239  AST 25  ALT 17  ALKPHOS 43  BILITOT 0.2*  PROT 6.2*  ALBUMIN 3.2*    Recent Labs    10/08/22 2239  WBC 7.4  NEUTROABS 3.1  HGB 8.9*  HCT 25.6*  MCV 84.2  PLT 218    Radiological Exams on Admission: DG Chest Port 1 View  Result Date: 10/08/2022 CLINICAL DATA:  Syncope EXAM: PORTABLE CHEST 1 VIEW COMPARISON:  Chest radiographs 12/13/2021 FINDINGS: Stable cardiomediastinal silhouette. No focal consolidation, pleural effusion, or pneumothorax. No displaced rib fractures. IMPRESSION: No active disease. Electronically Signed   By: Minerva Fester M.D.   On: 10/08/2022 23:09    Assessment/Plan Present on Admission:  Syncope and collapse -Unclear etiology.  Orthostatic vitals ordered, resulted after IV fluids were given. -Home 2D echo, carotid ultrasound ordered -Cardiology  contacted by EDP. ?Holter monitor -IV fluid hydration  Prolonged QTc -Repleting potassium, repeat EKG at 8 AM  Hypokalemia -Bleeding IV and p.o. -Repeat BMP in a.m.  HTN -Lisinopril and Norvasc resumed.  HCTZ on hold  HLD -History resumed  Harding Thomure 10/09/2022, 2:38 AM

## 2022-10-10 ENCOUNTER — Telehealth: Payer: Self-pay | Admitting: Physician Assistant

## 2022-10-10 ENCOUNTER — Other Ambulatory Visit: Payer: Self-pay | Admitting: Physician Assistant

## 2022-10-10 ENCOUNTER — Encounter: Payer: Self-pay | Admitting: Family Medicine

## 2022-10-10 ENCOUNTER — Telehealth: Payer: Self-pay | Admitting: Family Medicine

## 2022-10-10 ENCOUNTER — Other Ambulatory Visit (INDEPENDENT_AMBULATORY_CARE_PROVIDER_SITE_OTHER): Payer: 59

## 2022-10-10 DIAGNOSIS — E876 Hypokalemia: Secondary | ICD-10-CM | POA: Diagnosis not present

## 2022-10-10 DIAGNOSIS — R55 Syncope and collapse: Secondary | ICD-10-CM

## 2022-10-10 DIAGNOSIS — R9431 Abnormal electrocardiogram [ECG] [EKG]: Secondary | ICD-10-CM | POA: Diagnosis not present

## 2022-10-10 NOTE — Telephone Encounter (Signed)
Pt has an upcoming hosp f/up with Dr Janee Morn on 10/19/22 for  hosp f/up-Syncope and collapse Principal problem-10/08/2022 - present (2 days) [facility]

## 2022-10-10 NOTE — Progress Notes (Signed)
Patient given education on discharge instructions, verbalized understanding. IV's removed.

## 2022-10-10 NOTE — Telephone Encounter (Signed)
Will route request to monitor team - patient being dc today, needs 14 day live Zio mailed to home for syncope. Patient aware to expect this to be delivered to her home. (Order already placed in orders only encounter.)

## 2022-10-10 NOTE — Progress Notes (Addendum)
Chart reviewed and discussed with [facility] cardiology attending Dr. Servando Salina. Echo reassuring. Telemetry shows ectopic atrial rhythm, rare PACs, no pauses, no sustained arrhythmias, rates are all normal. Per d/w Dr. Servando Salina, Will arrange 14 day live Zio to be mailed to her home with 5 week f/u arranged thereafter. Appt info placed on AVS. Sent msg to nurse to re-print. Also spoke with patient to ensure she is aware.

## 2022-10-10 NOTE — Progress Notes (Unsigned)
Enrolled for [employer] to mail a ZIO AT Live Telemetry monitor to patients address on file.   Dr. Mary Branch to read. 

## 2022-10-10 NOTE — Discharge Summary (Signed)
Physician Discharge Summary  Barbara [name] WUJ:[id number] DOB: 09-16-1960 DOA: 10/08/2022  PCP: Garnette Gunner, MD  Admit date: 10/08/2022 Discharge date: 10/10/2022  Admitted From: Home Disposition:  home  Recommendations for Outpatient Follow-up:  Follow up with Cardiology in 1-2 weeks Please obtain BMP/CBC in one week   Home Health:No Equipment/Devices:None  Discharge Condition:Stable CODE STATUS:Full Diet recommendation: Heart Healthy   Brief/Interim Summary:  62 y.o. female with past medical history significant for essential hypertension who comes in for syncopal episode, brief loss of consciousness no seizure activity no postictal state no loss of control sphincters.  She did not hit her head, she relate her left heart cath 10 years ago was clean the only new medication it was as hydrochlorothiazide was started on February   Discharge Diagnoses:  Principal Problem:   Syncope and collapse Active Problems:   Hypokalemia   Prolonged QT interval  Syncope and collapse: Of unclear etiology, possibly vasovagal orthostatics were negative on admission. She was mildly hypokalemic which was repleted. Twelve-lead EKG showed normal sinus rhythm, normal axis T wave inversion in the lateral leads with no reciprocal changes. 2D echo was done that showed an EF of 50%, no wall motion abnormalities. Cardiology was consulted recommended conservative management and follow-up with them as an outpatient.  Hypokalemia: Replete orally now resolved.    Discharge Instructions  Discharge Instructions     Diet - low sodium heart healthy   Complete by: As directed    Increase activity slowly   Complete by: As directed       Allergies as of 10/10/2022       Reactions   Iodinated Contrast Media Anaphylaxis   States he face swells when she has CT iodinated contrast   Iodine Swelling   Other reaction(s): Facial swelling   Morphine And Codeine Itching   Other Swelling   All Nut  Allergies   Sulfa Antibiotics Swelling   Sulfamethoxazole-trimethoprim Other (See Comments)        Medication List     TAKE these medications    amLODipine 5 MG tablet Commonly known as: NORVASC Take 1 tablet (5 mg total) by mouth daily.   cyclobenzaprine 5 MG tablet Commonly known as: FLEXERIL TAKE 1-2 TABLETS (5-10 MG TOTAL) BY MOUTH AT BEDTIME. What changed:  when to take this reasons to take this   diclofenac Sodium 1 % Gel Commonly known as: VOLTAREN APPLY 4 G TOPICALLY 4 (FOUR) TIMES DAILY AS NEEDED. What changed: reasons to take this   dicyclomine 20 MG tablet Commonly known as: BENTYL Take 1 tablet (20 mg total) by mouth in the morning and at bedtime.   famotidine 40 MG tablet Commonly known as: PEPCID TAKE 1 TABLET BY MOUTH EVERYDAY AT BEDTIME   hydrochlorothiazide 25 MG tablet Commonly known as: HYDRODIURIL Take 1 tablet (25 mg total) by mouth daily.   lisinopril 10 MG tablet Commonly known as: ZESTRIL Take 1 tablet (10 mg total) by mouth daily.   montelukast 10 MG tablet Commonly known as: SINGULAIR Take 1 tablet (10 mg total) by mouth at bedtime.   pantoprazole 40 MG tablet Commonly known as: PROTONIX Take 40 mg by mouth daily.   rosuvastatin 10 MG tablet Commonly known as: Crestor Take 1 tablet (10 mg total) by mouth daily.        Allergies  Allergen Reactions   Iodinated Contrast Media Anaphylaxis    States he face swells when she has CT iodinated contrast   Iodine Swelling  Other reaction(s): Facial swelling   Morphine And Codeine Itching   Other Swelling    All Nut Allergies   Sulfa Antibiotics Swelling   Sulfamethoxazole-Trimethoprim Other (See Comments)    Consultations: Cardiology   Procedures/Studies: ECHOCARDIOGRAM COMPLETE  Result Date: 10/09/2022    ECHOCARDIOGRAM REPORT   Patient Name:   Barbara [name] Date of Exam: 10/09/2022 Medical Rec #:  [id number]     Height:       69.0 in Accession #:    [id number]     Weight:       155.2 lb Date of Birth:  06-30-1960     BSA:          1.855 m Patient Age:    61 years      BP:           107/68 mmHg Patient Gender: F             HR:           77 bpm. Exam Location:  Inpatient Procedure: 2D Echo, Cardiac Doppler and Color Doppler Indications:    syncope  History:        Patient has no prior history of Echocardiogram examinations.                 Risk Factors:Hypertension.  Sonographer:    Mike Gip Referring Phys: [phone] DEBBY CROSLEY IMPRESSIONS  1. Left ventricular ejection fraction, by estimation, is 60 to 65%. The left ventricle has normal function. The left ventricle has no regional wall motion abnormalities. Left ventricular diastolic parameters were normal.  2. Right ventricular systolic function is normal. The right ventricular size is normal. There is normal pulmonary artery systolic pressure.  3. The mitral valve is normal in structure. No evidence of mitral valve regurgitation.  4. The aortic valve is normal in structure. Aortic valve regurgitation is not visualized.  5. The inferior vena cava is normal in size with greater than 50% respiratory variability, suggesting right atrial pressure of 3 mmHg. Conclusion(s)/Recommendation(s): Normal biventricular function without evidence of hemodynamically significant valvular heart disease. FINDINGS  Left Ventricle: Left ventricular ejection fraction, by estimation, is 60 to 65%. The left ventricle has normal function. The left ventricle has no regional wall motion abnormalities. The left ventricular internal cavity size was normal in size. There is  no left ventricular hypertrophy. Left ventricular diastolic parameters were normal. Right Ventricle: The right ventricular size is normal. Right ventricular systolic function is normal. There is normal pulmonary artery systolic pressure. The tricuspid regurgitant velocity is 2.71 m/s, and with an assumed right atrial pressure of 3 mmHg,  the estimated right ventricular systolic  pressure is 32.4 mmHg. Left Atrium: Left atrial size was normal in size. Right Atrium: Right atrial size was normal in size. Pericardium: There is no evidence of pericardial effusion. Mitral Valve: The mitral valve is normal in structure. No evidence of mitral valve regurgitation. Tricuspid Valve: The tricuspid valve is normal in structure. Tricuspid valve regurgitation is mild. Aortic Valve: The aortic valve is normal in structure. Aortic valve regurgitation is not visualized. Pulmonic Valve: Pulmonic valve regurgitation is not visualized. Aorta: The aortic root and ascending aorta are structurally normal, with no evidence of dilitation. Venous: The inferior vena cava is normal in size with greater than 50% respiratory variability, suggesting right atrial pressure of 3 mmHg. IAS/Shunts: The interatrial septum was not well visualized.  LEFT VENTRICLE PLAX 2D LVIDd:         4.60 cm  Diastology LVIDs:         2.70 cm     LV e' medial:    10.20 cm/s LV PW:         1.00 cm     LV E/e' medial:  8.4 LV IVS:        1.00 cm     LV e' lateral:   14.00 cm/s LVOT diam:     1.80 cm     LV E/e' lateral: 6.1 LV SV:         68 LV SV Index:   36 LVOT Area:     2.54 cm  LV Volumes (MOD) LV vol d, MOD A2C: 83.9 ml LV vol d, MOD A4C: 71.5 ml LV vol s, MOD A2C: 24.8 ml LV vol s, MOD A4C: 28.2 ml LV SV MOD A2C:     59.1 ml LV SV MOD A4C:     71.5 ml LV SV MOD BP:      51.4 ml RIGHT VENTRICLE             IVC RV Basal diam:  3.90 cm     IVC diam: 2.00 cm RV S prime:     15.00 cm/s TAPSE (M-mode): 2.4 cm LEFT ATRIUM             Index        RIGHT ATRIUM           Index LA diam:        3.50 cm 1.89 cm/m   RA Area:     16.20 cm LA Vol (A2C):   42.0 ml 22.64 ml/m  RA Volume:   39.50 ml  21.29 ml/m LA Vol (A4C):   39.0 ml 21.03 ml/m LA Biplane Vol: 43.3 ml 23.34 ml/m  AORTIC VALVE LVOT Vmax:   138.00 cm/s LVOT Vmean:  88.800 cm/s LVOT VTI:    0.266 m  AORTA Ao Root diam: 3.10 cm Ao Asc diam:  2.80 cm MITRAL VALVE                TRICUSPID VALVE MV Area (PHT): 3.43 cm    TR Peak grad:   29.4 mmHg MV Decel Time: 221 msec    TR Vmax:        271.00 cm/s MV E velocity: 86.10 cm/s MV A velocity: 88.30 cm/s  SHUNTS MV E/A ratio:  0.98        Systemic VTI:  0.27 m                            Systemic Diam: 1.80 cm Carolan Clines Electronically signed by Carolan Clines Signature Date/Time: 10/09/2022/10:50:47 AM    Final    VAS US CAROTID  Result Date: 10/09/2022 Carotid Arterial Duplex Study Patient Name:  Barbara [name]  Date of Exam:   10/09/2022 Medical Rec #: [id number]      Accession #:    [id number] Date of Birth: 09-30-1960      Patient Gender: F Patient Age:   55 years Exam Location:  [facility] Procedure:      VAS US CAROTID Referring Phys: DEBBY CROSLEY --------------------------------------------------------------------------------  Indications:       Syncope. Risk Factors:      Past history of smoking. Comparison Study:  No prior studies. Performing Technologist: Jean Rosenthal RDMS, RVT  Examination Guidelines: A complete evaluation includes B-mode imaging, spectral Doppler, color Doppler, and power Doppler as needed of all accessible  portions of each vessel. Bilateral testing is considered an integral part of a complete examination. Limited examinations for reoccurring indications may be performed as noted.  Right Carotid Findings: +----------+--------+--------+--------+------------------+------------------+           PSV cm/sEDV cm/sStenosisPlaque DescriptionComments           +----------+--------+--------+--------+------------------+------------------+ CCA Prox  123     30                                                   +----------+--------+--------+--------+------------------+------------------+ CCA Distal120     31                                intimal thickening +----------+--------+--------+--------+------------------+------------------+ ICA Prox  99      31                                                    +----------+--------+--------+--------+------------------+------------------+ ICA Distal119     41                                                   +----------+--------+--------+--------+------------------+------------------+ ECA       86      23                                                   +----------+--------+--------+--------+------------------+------------------+ +----------+--------+-------+----------------+-------------------+           PSV cm/sEDV cmsDescribe        Arm Pressure (mmHG) +----------+--------+-------+----------------+-------------------+ [id number]            Multiphasic, WNL                    +----------+--------+-------+----------------+-------------------+ +---------+--------+--+--------+--+---------+ VertebralPSV cm/s87EDV cm/s35Antegrade +---------+--------+--+--------+--+---------+  Left Carotid Findings: +----------+--------+--------+--------+------------------+------------------+           PSV cm/sEDV cm/sStenosisPlaque DescriptionComments           +----------+--------+--------+--------+------------------+------------------+ CCA Prox  126     28                                                   +----------+--------+--------+--------+------------------+------------------+ CCA Distal106     29                                intimal thickening +----------+--------+--------+--------+------------------+------------------+ ICA Prox  112     37                                                   +----------+--------+--------+--------+------------------+------------------+ ICA Distal102  41                                                   +----------+--------+--------+--------+------------------+------------------+ ECA       97      16                                                   +----------+--------+--------+--------+------------------+------------------+  +----------+--------+--------+----------------+-------------------+           PSV cm/sEDV cm/sDescribe        Arm Pressure (mmHG) +----------+--------+--------+----------------+-------------------+ [id number]             Multiphasic, WNL                    +----------+--------+--------+----------------+-------------------+ +---------+--------+--+--------+--+---------+ VertebralPSV cm/s94EDV cm/s25Antegrade +---------+--------+--+--------+--+---------+   Summary: Right Carotid: The extracranial vessels were near-normal with only minimal wall                thickening or plaque. Left Carotid: The extracranial vessels were near-normal with only minimal wall               thickening or plaque. Vertebrals:  Bilateral vertebral arteries demonstrate antegrade flow. Subclavians: Normal flow hemodynamics were seen in bilateral subclavian              arteries. *See table(s) above for measurements and observations.     Preliminary    DG Chest Port 1 View  Result Date: 10/08/2022 CLINICAL DATA:  Syncope EXAM: PORTABLE CHEST 1 VIEW COMPARISON:  Chest radiographs 12/13/2021 FINDINGS: Stable cardiomediastinal silhouette. No focal consolidation, pleural effusion, or pneumothorax. No displaced rib fractures. IMPRESSION: No active disease. Electronically Signed   By: Minerva Fester M.D.   On: 10/08/2022 23:09   (Echo, Carotid, EGD, Colonoscopy, ERCP)    Subjective: Complains  Discharge Exam: Vitals:   10/09/22 2109 10/10/22 0528  BP: 109/67 117/80  Pulse: 70 67  Resp: 18 18  Temp: 98.5 F (36.9 C) (!) 97.4 F (36.3 C)  SpO2: 100% 99%   Vitals:   10/09/22 0833 10/09/22 1158 10/09/22 2109 10/10/22 0528  BP: 107/68 103/63 109/67 117/80  Pulse: 69 73 70 67  Resp: 17 18 18 18   Temp: 98.3 F (36.8 C) 98.1 F (36.7 C) 98.5 F (36.9 C) (!) 97.4 F (36.3 C)  TempSrc: Oral Oral Oral Oral  SpO2: 98% 100% 100% 99%  Weight:      Height:        General: Pt is alert, awake, not in acute  distress Cardiovascular: RRR, S1/S2 +, no rubs, no gallops Respiratory: CTA bilaterally, no wheezing, no rhonchi Abdominal: Soft, NT, ND, bowel sounds + Extremities: no edema, no cyanosis    The results of significant diagnostics from this hospitalization (including imaging, microbiology, ancillary and laboratory) are listed below for reference.     Microbiology: Recent Results (from the past 240 hour(s))  SARS Coronavirus 2 by RT PCR (hospital order, performed in [facility] hospital lab) *cepheid single result test* Anterior Nasal Swab     Status: None   Collection Time: 10/09/22  2:30 AM   Specimen: Anterior Nasal Swab  Result Value Ref Range Status   SARS Coronavirus 2 by RT PCR NEGATIVE NEGATIVE  Final    Comment: (NOTE) SARS-CoV-2 target nucleic acids are NOT DETECTED.  The SARS-CoV-2 RNA is generally detectable in upper and lower respiratory specimens during the acute phase of infection. The lowest concentration of SARS-CoV-2 viral copies this assay can detect is 250 copies / mL. A negative result does not preclude SARS-CoV-2 infection and should not be used as the sole basis for treatment or other patient management decisions.  A negative result may occur with improper specimen collection / handling, submission of specimen other than nasopharyngeal swab, presence of viral mutation(s) within the areas targeted by this assay, and inadequate number of viral copies (<250 copies / mL). A negative result must be combined with clinical observations, patient history, and epidemiological information.  Fact Sheet for Patients:   RoadLapTop.co.za  Fact Sheet for Healthcare Providers: http://kim-miller.com/  This test is not yet approved or  cleared by the [place] FDA and has been authorized for detection and/or diagnosis of SARS-CoV-2 by FDA under an Emergency Use Authorization (EUA).  This EUA will remain in effect (meaning this  test can be used) for the duration of the COVID-19 declaration under Section 564(b)(1) of the Act, 21 U.S.C. section 360bbb-3(b)(1), unless the authorization is terminated or revoked sooner.  Performed at [facility], [address], [place], [place] [address]      Labs: BNP (last 3 results) No results for input(s): "BNP" in the last 8760 hours. Basic Metabolic Panel: Recent Labs  Lab 10/08/22 2239 10/09/22 0402  NA 138 138  K 2.7* 4.0  CL 110 107  CO2 21* 24  GLUCOSE 89 107*  BUN 17 16  CREATININE 0.81 0.78  CALCIUM 7.8* 8.2*  MG 2.2  --    Liver Function Tests: Recent Labs  Lab 10/08/22 2239  AST 25  ALT 17  ALKPHOS 43  BILITOT 0.2*  PROT 6.2*  ALBUMIN 3.2*   No results for input(s): "LIPASE", "AMYLASE" in the last 168 hours. No results for input(s): "AMMONIA" in the last 168 hours. CBC: Recent Labs  Lab 10/08/22 2239 10/09/22 0402  WBC 7.4 6.7  NEUTROABS 3.1 3.6  HGB 8.9* 9.1*  HCT 25.6* 25.6*  MCV 84.2 84.8  PLT 218 229   Cardiac Enzymes: No results for input(s): "CKTOTAL", "CKMB", "CKMBINDEX", "TROPONINI" in the last 168 hours. BNP: Invalid input(s): "POCBNP" CBG: No results for input(s): "GLUCAP" in the last 168 hours. D-Dimer No results for input(s): "DDIMER" in the last 72 hours. Hgb A1c No results for input(s): "HGBA1C" in the last 72 hours. Lipid Profile No results for input(s): "CHOL", "HDL", "LDLCALC", "TRIG", "CHOLHDL", "LDLDIRECT" in the last 72 hours. Thyroid function studies No results for input(s): "TSH", "T4TOTAL", "T3FREE", "THYROIDAB" in the last 72 hours.  Invalid input(s): "FREET3" Anemia work up Recent Labs    10/09/22 0402  VITAMINB12 311  FOLATE 5.4*  FERRITIN 180  TIBC 335  IRON 59  RETICCTPCT 1.0   Urinalysis    Component Value Date/Time   COLORURINE YELLOW 06/28/2022 0847   APPEARANCEUR CLEAR 06/28/2022 0847   LABSPEC 1.025 06/28/2022 0847   PHURINE 6.0 06/28/2022 0847   GLUCOSEU  NEGATIVE 06/28/2022 0847   HGBUR NEGATIVE 06/28/2022 0847   BILIRUBINUR NEGATIVE 06/28/2022 0847   KETONESUR TRACE (A) 06/28/2022 0847   PROTEINUR NEGATIVE 12/13/2021 1721   UROBILINOGEN 0.2 06/28/2022 0847   NITRITE NEGATIVE 06/28/2022 0847   LEUKOCYTESUR NEGATIVE 06/28/2022 0847   Sepsis Labs Recent Labs  Lab 10/08/22 2239 10/09/22 0402  WBC 7.4 6.7  Microbiology Recent Results (from the past 240 hour(s))  SARS Coronavirus 2 by RT PCR (hospital order, performed in [facility] hospital lab) *cepheid single result test* Anterior Nasal Swab     Status: None   Collection Time: 10/09/22  2:30 AM   Specimen: Anterior Nasal Swab  Result Value Ref Range Status   SARS Coronavirus 2 by RT PCR NEGATIVE NEGATIVE Final    Comment: (NOTE) SARS-CoV-2 target nucleic acids are NOT DETECTED.  The SARS-CoV-2 RNA is generally detectable in upper and lower respiratory specimens during the acute phase of infection. The lowest concentration of SARS-CoV-2 viral copies this assay can detect is 250 copies / mL. A negative result does not preclude SARS-CoV-2 infection and should not be used as the sole basis for treatment or other patient management decisions.  A negative result may occur with improper specimen collection / handling, submission of specimen other than nasopharyngeal swab, presence of viral mutation(s) within the areas targeted by this assay, and inadequate number of viral copies (<250 copies / mL). A negative result must be combined with clinical observations, patient history, and epidemiological information.  Fact Sheet for Patients:   RoadLapTop.co.za  Fact Sheet for Healthcare Providers: http://kim-miller.com/  This test is not yet approved or  cleared by the [place] FDA and has been authorized for detection and/or diagnosis of SARS-CoV-2 by FDA under an Emergency Use Authorization (EUA).  This EUA will remain in effect  (meaning this test can be used) for the duration of the COVID-19 declaration under Section 564(b)(1) of the Act, 21 U.S.C. section 360bbb-3(b)(1), unless the authorization is terminated or revoked sooner.  Performed at [facility], [address], [place], [place] [address]      SIGNED:   Marinda Elk, MD  [facility] 10/10/2022, 8:16 AM Pager   If 7PM-7AM, please contact night-coverage [email or web address] Password [email or web address]

## 2022-10-11 ENCOUNTER — Telehealth: Payer: Self-pay

## 2022-10-11 NOTE — Transitions of Care (Post Inpatient/ED Visit) (Signed)
   10/11/2022  Name: Barbara [name] MRN: [id number] DOB: Oct 09, 1960  Today's TOC FU Call Status: Today's TOC FU Call Status:: Successful TOC FU Call Competed TOC FU Call Complete Date: 10/11/22  Transition Care Management Follow-up Telephone Call Date of Discharge: 10/10/22 Discharge Facility: [facility]) Type of Discharge: Inpatient Admission Primary Inpatient Discharge Diagnosis:: Syncope and collapse How have you been since you were released from the hospital?: Better Any questions or concerns?: No  Items Reviewed: Did you receive and understand the discharge instructions provided?: Yes Medications obtained,verified, and reconciled?: Yes (Medications Reviewed) Any new allergies since your discharge?: No Dietary orders reviewed?: NA Do you have support at home?: Yes  Medications Reviewed Today: Medications Reviewed Today     Reviewed by Leigh Aurora, CMA (Certified Medical Assistant) on 10/11/22 at 1218  Med List Status: <None>   Medication Order Taking? Sig Documenting Provider Last Dose Status Informant  amLODipine (NORVASC) 5 MG tablet [id number] No Take 1 tablet (5 mg total) by mouth daily. Garnette Gunner, MD 10/08/2022 Active Self  cyclobenzaprine (FLEXERIL) 5 MG tablet [id number] No TAKE 1-2 TABLETS (5-10 MG TOTAL) BY MOUTH AT BEDTIME.  Patient taking differently: Take 5-10 mg by mouth at bedtime as needed for muscle spasms.   Garnette Gunner, MD unknown Active Self  diclofenac Sodium (VOLTAREN) 1 % GEL [id number] No APPLY 4 G TOPICALLY 4 (FOUR) TIMES DAILY AS NEEDED.  Patient taking differently: Apply 4 g topically 4 (four) times daily as needed (pain).   Garnette Gunner, MD unknown Active Self  dicyclomine (BENTYL) 20 MG tablet [id number] No Take 1 tablet (20 mg total) by mouth in the morning and at bedtime. Margaretann Loveless, [place] 10/08/2022 Active Self  famotidine (PEPCID) 40 MG tablet [id number] No TAKE 1 TABLET BY MOUTH EVERYDAY AT BEDTIME McElwee,  Lauren A, NP 10/07/2022 Active Self  hydrochlorothiazide (HYDRODIURIL) 25 MG tablet [id number] No Take 1 tablet (25 mg total) by mouth daily. Garnette Gunner, MD 10/08/2022 Active Self  lisinopril (ZESTRIL) 10 MG tablet [id number] No Take 1 tablet (10 mg total) by mouth daily. Garnette Gunner, MD 10/08/2022 Active Self  montelukast (SINGULAIR) 10 MG tablet [id number] No Take 1 tablet (10 mg total) by mouth at bedtime. Garnette Gunner, MD 10/07/2022 Active Self  pantoprazole (PROTONIX) 40 MG tablet [id number] No Take 40 mg by mouth daily. [provider] 10/08/2022 Active Self  rosuvastatin (CRESTOR) 10 MG tablet [id number] No Take 1 tablet (10 mg total) by mouth daily. Garnette Gunner, MD 10/08/2022 Active Self            Home Care and Equipment/Supplies: Were Home Health Services Ordered?: NA Any new equipment or medical supplies ordered?: NA  Functional Questionnaire: Do you need assistance with bathing/showering or dressing?: No Do you need assistance with meal preparation?: No Do you need assistance with eating?: No Do you have difficulty maintaining continence: No Do you need assistance with getting out of bed/getting out of a chair/moving?: No Do you have difficulty managing or taking your medications?: No  Follow up appointments reviewed: PCP Follow-up appointment confirmed?: Yes Date of PCP follow-up appointment?: 10/19/22 Follow-up Provider: Dr. Janee Morn [facility] Follow-up appointment confirmed?: NA Do you need transportation to your follow-up appointment?: No Do you understand care options if your condition(s) worsen?: Yes-patient verbalized understanding    SIGNATURE Agnes Lawrence, CMA (AAMA)  [facility] [phone]

## 2022-10-12 ENCOUNTER — Emergency Department (HOSPITAL_COMMUNITY)
Admission: EM | Admit: 2022-10-12 | Discharge: 2022-10-12 | Disposition: A | Discharge status: Home / Self Care | Payer: 59 | Attending: Emergency Medicine | Admitting: Emergency Medicine

## 2022-10-12 ENCOUNTER — Emergency Department (HOSPITAL_COMMUNITY): Payer: 59

## 2022-10-12 ENCOUNTER — Telehealth: Payer: Self-pay | Admitting: Family Medicine

## 2022-10-12 ENCOUNTER — Other Ambulatory Visit: Payer: Self-pay

## 2022-10-12 ENCOUNTER — Encounter (HOSPITAL_COMMUNITY): Payer: Self-pay

## 2022-10-12 DIAGNOSIS — R202 Paresthesia of skin: Secondary | ICD-10-CM | POA: Diagnosis present

## 2022-10-12 DIAGNOSIS — G43009 Migraine without aura, not intractable, without status migrainosus: Secondary | ICD-10-CM

## 2022-10-12 DIAGNOSIS — R55 Syncope and collapse: Secondary | ICD-10-CM

## 2022-10-12 DIAGNOSIS — F419 Anxiety disorder, unspecified: Secondary | ICD-10-CM | POA: Diagnosis not present

## 2022-10-12 LAB — I-STAT CHEM 8, ED
BUN: 14 mg/dL (ref 8–23)
Calcium, Ion: 1.11 mmol/L — ABNORMAL LOW (ref 1.15–1.40)
Chloride: 104 mmol/L (ref 98–111)
Creatinine, Ser: 1 mg/dL (ref 0.44–1.00)
Glucose, Bld: 96 mg/dL (ref 70–99)
HCT: 32 % — ABNORMAL LOW (ref 36.0–46.0)
Hemoglobin: 10.9 g/dL — ABNORMAL LOW (ref 12.0–15.0)
Potassium: 3.8 mmol/L (ref 3.5–5.1)
Sodium: 137 mmol/L (ref 135–145)
TCO2: 24 mmol/L (ref 22–32)

## 2022-10-12 LAB — DIFFERENTIAL
Abs Immature Granulocytes: 0.01 10*3/uL (ref 0.00–0.07)
Basophils Absolute: 0 10*3/uL (ref 0.0–0.1)
Basophils Relative: 1 %
Eosinophils Absolute: 0 10*3/uL (ref 0.0–0.5)
Eosinophils Relative: 0 %
Immature Granulocytes: 0 %
Lymphocytes Relative: 46 %
Lymphs Abs: 3.4 10*3/uL (ref 0.7–4.0)
Monocytes Absolute: 0.5 10*3/uL (ref 0.1–1.0)
Monocytes Relative: 7 %
Neutro Abs: 3.5 10*3/uL (ref 1.7–7.7)
Neutrophils Relative %: 46 %

## 2022-10-12 LAB — COMPREHENSIVE METABOLIC PANEL
ALT: 16 U/L (ref 0–44)
AST: 23 U/L (ref 15–41)
Albumin: 3.6 g/dL (ref 3.5–5.0)
Alkaline Phosphatase: 54 U/L (ref 38–126)
Anion gap: 10 (ref 5–15)
BUN: 12 mg/dL (ref 8–23)
CO2: 21 mmol/L — ABNORMAL LOW (ref 22–32)
Calcium: 8.6 mg/dL — ABNORMAL LOW (ref 8.9–10.3)
Chloride: 103 mmol/L (ref 98–111)
Creatinine, Ser: 1.03 mg/dL — ABNORMAL HIGH (ref 0.44–1.00)
GFR, Estimated: >60 mL/min (ref >60)
Glucose, Bld: 98 mg/dL (ref 70–99)
Potassium: 3.6 mmol/L (ref 3.5–5.1)
Sodium: 134 mmol/L — ABNORMAL LOW (ref 135–145)
Total Bilirubin: 0.7 mg/dL (ref 0.3–1.2)
Total Protein: 7 g/dL (ref 6.5–8.1)

## 2022-10-12 LAB — CBG MONITORING, ED: Glucose-Capillary: 93 mg/dL (ref 70–99)

## 2022-10-12 LAB — CBC
HCT: 30.2 % — ABNORMAL LOW (ref 36.0–46.0)
Hemoglobin: 10.5 g/dL — ABNORMAL LOW (ref 12.0–15.0)
MCH: 28 pg (ref 26.0–34.0)
MCHC: 34.8 g/dL (ref 30.0–36.0)
MCV: 80.5 fL (ref 80.0–100.0)
Platelets: 260 10*3/uL (ref 150–400)
RBC: 3.75 MIL/uL — ABNORMAL LOW (ref 3.87–5.11)
RDW: 13.2 % (ref 11.5–15.5)
WBC: 7.4 10*3/uL (ref 4.0–10.5)
nRBC: 0 % (ref 0.0–0.2)

## 2022-10-12 LAB — ETHANOL: Alcohol, Ethyl (B): <10 mg/dL (ref <10)

## 2022-10-12 LAB — APTT: aPTT: 29 seconds (ref 24–36)

## 2022-10-12 LAB — PROTIME-INR
INR: 1 (ref 0.8–1.2)
Prothrombin Time: 13.4 seconds (ref 11.4–15.2)

## 2022-10-12 MED ORDER — LACTATED RINGERS IV BOLUS
1000.0000 mL | Freq: Once | INTRAVENOUS | Status: AC
  Administered 2022-10-12: 1000 mL via INTRAVENOUS

## 2022-10-12 MED ORDER — METOCLOPRAMIDE HCL 5 MG/ML IJ SOLN
10.0000 mg | Freq: Once | INTRAMUSCULAR | Status: AC
  Administered 2022-10-12: 10 mg via INTRAVENOUS
  Filled 2022-10-12: qty 2

## 2022-10-12 MED ORDER — DIPHENHYDRAMINE HCL 50 MG/ML IJ SOLN
25.0000 mg | Freq: Once | INTRAMUSCULAR | Status: AC
  Administered 2022-10-12: 25 mg via INTRAVENOUS
  Filled 2022-10-12: qty 1

## 2022-10-12 MED ORDER — STROKE: EARLY STAGES OF RECOVERY BOOK: Freq: Once | Status: DC

## 2022-10-12 MED ORDER — LORAZEPAM 2 MG/ML IJ SOLN
1.0000 mg | Freq: Once | INTRAMUSCULAR | Status: AC | PRN
  Administered 2022-10-12: 1 mg via INTRAVENOUS
  Filled 2022-10-12: qty 1

## 2022-10-12 NOTE — ED Notes (Signed)
Pt c/o dizziness(room spinning) while ambulating in hallway. Unsteady gait noted, pt returned back to bed. Dr Criss Alvine aware.

## 2022-10-12 NOTE — Telephone Encounter (Signed)
Pt called today at 10:20 stated she want to speak to the nurse and I let the pt know that the nurse was assisting another provider today can I leave the nurse a message. So the patient proceed to say that she feel light headed and tingle in the foot. I then asked the pt to hold on while I get her transferred to the access nurse

## 2022-10-12 NOTE — Consult Note (Signed)
Neurology Consultation  Reason for Consult: Code Stroke Referring Physician: Criss Alvine  CC: Right sided weakness, aphasia  History is obtained from: Patient  HPI: Barbara [name] is a 62 y.o. female with a past medical history of GERD, HTN, sickle cell train, HTN presenting with right sided weakness.  She was getting ready for or work when she had a sudden onset of right-sided weakness while she was getting dressed.  She had additionally called her PCP because she noticed tingling that progressed up her leg.  She also endorses a headache on the right side of her head.  EMS reported that she initially had difficulty with speaking however she does not appear to have any expressive or receptive aphasia.  She was recently discharged from [facility] after a syncopal episode.  She is currently wearing a heart monitor.    LKW: 1020 TNK given?: No Premorbid modified Rankin scale (mRS): 0   ROS: Full ROS was performed and is negative except as noted in the HPI.   Past Medical History:  Diagnosis Date   GERD (gastroesophageal reflux disease)    Hypertension    Low iron    Sickle cell trait (HCC)    Viral gastroenteritis 07/24/2022    Family History  Problem Relation Age of Onset   CAD Brother 37       CABG at age 5, and deceased   CAD Sister 20       1 stent   CAD Father 71       Deceased in 61s    Social History:   reports that she quit smoking about 9 years ago. Her smoking use included cigarettes. She has never used smokeless tobacco. She reports current alcohol use. She reports that she does not use drugs.  Medications No current facility-administered medications for this encounter.  Current Outpatient Medications:    amLODipine (NORVASC) 5 MG tablet, Take 1 tablet (5 mg total) by mouth daily., Disp: 90 tablet, Rfl: 0   cyclobenzaprine (FLEXERIL) 5 MG tablet, TAKE 1-2 TABLETS (5-10 MG TOTAL) BY MOUTH AT BEDTIME. (Patient taking differently: Take 5-10 mg by mouth at  bedtime as needed for muscle spasms.), Disp: 30 tablet, Rfl: 1   diclofenac Sodium (VOLTAREN) 1 % GEL, APPLY 4 G TOPICALLY 4 (FOUR) TIMES DAILY AS NEEDED. (Patient taking differently: Apply 4 g topically 4 (four) times daily as needed (pain).), Disp: 100 g, Rfl: 3   dicyclomine (BENTYL) 20 MG tablet, Take 1 tablet (20 mg total) by mouth in the morning and at bedtime., Disp: 20 tablet, Rfl: 0   famotidine (PEPCID) 40 MG tablet, TAKE 1 TABLET BY MOUTH EVERYDAY AT BEDTIME, Disp: 90 tablet, Rfl: 1   hydrochlorothiazide (HYDRODIURIL) 25 MG tablet, Take 1 tablet (25 mg total) by mouth daily., Disp: 90 tablet, Rfl: 3   lisinopril (ZESTRIL) 10 MG tablet, Take 1 tablet (10 mg total) by mouth daily., Disp: 180 tablet, Rfl: 1   montelukast (SINGULAIR) 10 MG tablet, Take 1 tablet (10 mg total) by mouth at bedtime., Disp: 90 tablet, Rfl: 0   pantoprazole (PROTONIX) 40 MG tablet, Take 40 mg by mouth daily., Disp: , Rfl:    rosuvastatin (CRESTOR) 10 MG tablet, Take 1 tablet (10 mg total) by mouth daily., Disp: 90 tablet, Rfl: 3   Exam: Current vital signs: There were no vitals taken for this visit. Vital signs in last 24 hours:    GENERAL: Awake, alert in NAD HEENT: - Normocephalic and atraumatic, dry mm, no LN++, no  Thyromegally LUNGS - Clear to auscultation bilaterally with no wheezes CV - S1S2 RRR, no m/r/g, equal pulses bilaterally. ABDOMEN - Soft, nontender, nondistended with normoactive BS Ext: warm, well perfused, intact peripheral pulses, no edema  NEURO:  Mental Status: AA&Ox3  Language: speech is clear.  Naming, repetition, fluency, and comprehension intact. Cranial Nerves: PERRL. EOMI, visual fields full, no facial asymmetry, facial sensation intact, hearing intact, tongue/uvula/soft palate midline, normal sternocleidomastoid and trapezius muscle strength. No evidence of tongue atrophy or fasciculations Motor: RUE 5/5  LUE 5/5 RLE 4+/5 with slight drift LLE 5/5 Tone: is normal and bulk is  normal Sensation- Intact to light touch bilaterally Coordination: FTN intact bilaterally, no ataxia in BLE. Gait- deferred  1a Level of Conscious.: 0 1b LOC Questions: 0 1c LOC Commands: 0 2 Best Gaze: 0 3 Visual: 0 4 Facial Palsy: 0 5a Motor Arm - left: 0 5b Motor Arm - Right: 0 6a Motor Leg - Left: 0 6b Motor Leg - Right: 1 7 Limb Ataxia: 0 8 Sensory: 0 9 Best Language: 0 10 Dysarthria: 0 11 Extinct. and Inatten.: 0 TOTAL: 1   Labs I have reviewed labs in epic and the results pertinent to this consultation are:   CBC    Component Value Date/Time   WBC 6.7 10/09/2022 0402   RBC 3.02 (L) 10/09/2022 0402   RBC 3.29 (L) 10/09/2022 0402   HGB 9.1 (L) 10/09/2022 0402   HCT 25.6 (L) 10/09/2022 0402   PLT 229 10/09/2022 0402   MCV 84.8 10/09/2022 0402   MCH 30.1 10/09/2022 0402   MCHC 35.5 10/09/2022 0402   RDW 12.9 10/09/2022 0402   LYMPHSABS 2.5 10/09/2022 0402   MONOABS 0.6 10/09/2022 0402   EOSABS 0.0 10/09/2022 0402   BASOSABS 0.0 10/09/2022 0402    CMP     Component Value Date/Time   NA 138 10/09/2022 0402   K 4.0 10/09/2022 0402   CL 107 10/09/2022 0402   CO2 24 10/09/2022 0402   GLUCOSE 107 (H) 10/09/2022 0402   BUN 16 10/09/2022 0402   CREATININE 0.78 10/09/2022 0402   CALCIUM 8.2 (L) 10/09/2022 0402   PROT 6.2 (L) 10/08/2022 2239   ALBUMIN 3.2 (L) 10/08/2022 2239   AST 25 10/08/2022 2239   ALT 17 10/08/2022 2239   ALKPHOS 43 10/08/2022 2239   BILITOT 0.2 (L) 10/08/2022 2239   GFRNONAA >60 10/09/2022 0402   GFRAA >60 07/17/2018 2006    Lipid Panel     Component Value Date/Time   CHOL 164 06/28/2022 0847   TRIG 84.0 06/28/2022 0847   HDL 85.10 06/28/2022 0847   CHOLHDL 2 06/28/2022 0847   VLDL 16.8 06/28/2022 0847   LDLCALC 62 06/28/2022 0847     Imaging I have reviewed the images obtained:  CT-head 1. No evidence of acute intracranial abnormality. 2. ASPECTS is 10.  MRI examination of the brain 1. No acute intracranial  abnormality. 2. Mild chronic small vessel ischemic disease. 3. Negative head MRA.  Assessment:  62 y.o. female presenting with past medical history of GERD, HTN, sickle cell train, HTN presenting with right sided weakness.  She was recently admitted for a syncopal episode and is currently wearing a heart monitor.  Recommend migraine cocktail as she is reporting a headache on the right side of her head.  MRI was negative for an acute infarct.  Neurology will remain available as needed and if symptoms do not improve.  Impression: Complicated migraine  Recommendations: - Migraine cocktail per  EDP - No indication for new antiplatelets - Interpretation of her heart monitor and follow-up with cardiology outpatient - Neurology will remain available as needed  Patient seen and examined by NP/APP with MD. MD to update note as needed.   Elmer Picker, DNP, FNP-BC [facility] Pager: [phone]   Attending Neurohospitalist Addendum Patient seen and examined with APP/Resident. Agree with the history and physical as documented above. Agree with the plan as documented, which I helped formulate. I have edited the note above to reflect my full findings and recommendations. I have independently reviewed the chart, obtained history, review of systems and examined the patient.I have personally reviewed pertinent head/neck/spine imaging (CT/MRI). Please feel free to call with any questions.  -- Bing Neighbors, MD [facility] [phone]  If 7pm- 7am, please page neurology on call as listed in AMION.

## 2022-10-12 NOTE — ED Triage Notes (Signed)
Pt present to ED from home with c/o headache, numbness after taking shower this morning. Last well known time 1020.

## 2022-10-12 NOTE — Discharge Instructions (Signed)
It does not appear that you had a today.  It is more likely had something called a complicated migraine.  You will need to follow-up with neurology and I have placed a referral, you can call their office tomorrow to help set this up.  If you develop new or worsening headache, weakness or numbness in your arms or legs, speech difficulty, or any other new/concerning symptoms and return to the ER or call 911.

## 2022-10-12 NOTE — ED Provider Notes (Signed)
[place] EMERGENCY DEPARTMENT AT [facility] Provider Note   CSN: [id number] Arrival date & time: 10/12/22  1119  An emergency department physician performed an initial assessment on this suspected stroke patient at 1120.  History  Chief Complaint  Patient presents with   Stroke Symptoms    Allean Schwiebert is a 62 y.o. female.  HPI 61 year old female with code stroke.  Last known well was around 10 AM.  The patient reports that she was in the shower and felt fine and then noticed right foot tingling that progressed up her leg and into her right hand.  She does feel like her right leg is a little heavy.  Does not feel that there is any weakness in her arm.  She also developed a right-sided headache that is about a 6 out of 10.  It has mildly progressively worsened since onset.  She was having trouble speaking according to EMS, was speaking slowly and having a hard time getting words out.  Patient states she never felt like she had trouble with speaking.  No visual field deficits.  Was just admitted and discharged within the last few days for syncope.  She states she did get a recurrent lightheadedness but these other symptoms are new.  Home Medications Prior to Admission medications   Medication Sig Start Date End Date Taking? Authorizing Provider  amLODipine (NORVASC) 5 MG tablet Take 1 tablet (5 mg total) by mouth daily. 09/08/22 12/07/22  Garnette Gunner, MD  cyclobenzaprine (FLEXERIL) 5 MG tablet TAKE 1-2 TABLETS (5-10 MG TOTAL) BY MOUTH AT BEDTIME. Patient taking differently: Take 5-10 mg by mouth at bedtime as needed for muscle spasms. 08/22/22   Garnette Gunner, MD  diclofenac Sodium (VOLTAREN) 1 % GEL APPLY 4 G TOPICALLY 4 (FOUR) TIMES DAILY AS NEEDED. Patient taking differently: Apply 4 g topically 4 (four) times daily as needed (pain). 08/23/22   Garnette Gunner, MD  dicyclomine (BENTYL) 20 MG tablet Take 1 tablet (20 mg total) by mouth in the morning and at bedtime.  07/18/22   Margaretann Loveless, PA-C  famotidine (PEPCID) 40 MG tablet TAKE 1 TABLET BY MOUTH EVERYDAY AT BEDTIME 05/24/22   McElwee, Lauren A, NP  hydrochlorothiazide (HYDRODIURIL) 25 MG tablet Take 1 tablet (25 mg total) by mouth daily. 04/28/22   Garnette Gunner, MD  lisinopril (ZESTRIL) 10 MG tablet Take 1 tablet (10 mg total) by mouth daily. 07/11/22   Garnette Gunner, MD  montelukast (SINGULAIR) 10 MG tablet Take 1 tablet (10 mg total) by mouth at bedtime. 09/08/22 12/07/22  Garnette Gunner, MD  pantoprazole (PROTONIX) 40 MG tablet Take 40 mg by mouth daily. 12/26/14   [provider]  rosuvastatin (CRESTOR) 10 MG tablet Take 1 tablet (10 mg total) by mouth daily. 09/07/22   Garnette Gunner, MD      Allergies    Iodinated contrast media, Iodine, Morphine and codeine, Other, Sulfa antibiotics, and Sulfamethoxazole-trimethoprim    Review of Systems   Review of Systems  Eyes:  Positive for visual disturbance (mild blurry vision).  Respiratory:  Negative for shortness of breath.   Cardiovascular:  Negative for chest pain.  Gastrointestinal:  Positive for nausea. Negative for vomiting.  Musculoskeletal:  Negative for neck pain.  Neurological:  Positive for weakness, light-headedness, numbness and headaches.    Physical Exam Updated Vital Signs BP 108/71   Pulse 70   Temp 98.6 F (37 C) (Oral)   Resp 14  Ht 5\' 9"  (1.753 m)   Wt 70.4 kg   SpO2 100%   BMI 22.92 kg/m  Physical Exam Vitals and nursing note reviewed.  Constitutional:      General: She is not in acute distress.    Appearance: She is well-developed. She is not ill-appearing or diaphoretic.  HENT:     Head: Normocephalic and atraumatic.  Eyes:     Extraocular Movements: Extraocular movements intact.     Pupils: Pupils are equal, round, and reactive to light.     Comments: Normal visual field testing  Cardiovascular:     Rate and Rhythm: Normal rate and regular rhythm.     Heart sounds: Normal heart  sounds.  Pulmonary:     Effort: Pulmonary effort is normal.     Breath sounds: Normal breath sounds.  Abdominal:     Palpations: Abdomen is soft.     Tenderness: There is no abdominal tenderness.  Skin:    General: Skin is warm and dry.  Neurological:     Mental Status: She is alert.     Comments: CN 3-12 grossly intact. 5/5 strength in both upper extremities. 5/5 strength in left lower extremity. Some mild weakness in right lower extremity. Grossly normal sensation in all 4 extremities. Normal finger to nose.      ED Results / Procedures / Treatments   Labs (all labs ordered are listed, but only abnormal results are displayed) Labs Reviewed  CBC - Abnormal; Notable for the following components:      Result Value   RBC 3.75 (*)    Hemoglobin 10.5 (*)    HCT 30.2 (*)    All other components within normal limits  COMPREHENSIVE METABOLIC PANEL - Abnormal; Notable for the following components:   Sodium 134 (*)    CO2 21 (*)    Creatinine, Ser 1.03 (*)    Calcium 8.6 (*)    All other components within normal limits  I-STAT CHEM 8, ED - Abnormal; Notable for the following components:   Calcium, Ion 1.11 (*)    Hemoglobin 10.9 (*)    HCT 32.0 (*)    All other components within normal limits  PROTIME-INR  APTT  DIFFERENTIAL  ETHANOL  CBG MONITORING, ED    EKG EKG Interpretation  Date/Time:  Wednesday Oct 12 2022 11:38:32 EDT Ventricular Rate:  80 PR Interval:  143 QRS Duration: 87 QT Interval:  400 QTC Calculation: 462 R Axis:   51 Text Interpretation: Sinus rhythm Probable left atrial enlargement Borderline T abnormalities, inferior leads similar to Oct 09 2022 Confirmed by Pricilla Loveless [phone]) on 10/12/2022 12:19:22 PM  Radiology MR BRAIN WO CONTRAST  Result Date: 10/12/2022 CLINICAL DATA:  Stroke/TIA, determine embolic source. Transient ischemic attack (TIA). Neuro deficit, acute, stroke suspected. Headache and numbness. Right-sided weakness. EXAM: MRI HEAD  WITHOUT CONTRAST MRA HEAD WITHOUT CONTRAST TECHNIQUE: Multiplanar, multi-echo pulse sequences of the brain and surrounding structures were acquired without intravenous contrast. Angiographic images of the Circle of Willis were acquired using MRA technique without intravenous contrast. COMPARISON:  Head CT 10/12/2022.  Head MRI and MRA 12/13/2021. FINDINGS: MRI HEAD FINDINGS Brain: There is no evidence of an acute infarct, intracranial hemorrhage, mass, midline shift, or extra-axial fluid collection. T2 hyperintensities in the left greater than right cerebral white matter are unchanged from the prior MRI and are nonspecific but compatible with mild chronic small vessel ischemic disease. The ventricles and sulci are normal. Vascular: Major intracranial vascular flow voids are preserved. Skull  and upper cervical spine: No suspicious marrow lesion. Sinuses/Orbits: Unremarkable orbits. Paranasal sinuses and mastoid air cells are clear. Other: None. MRA HEAD FINDINGS Anterior circulation: The internal carotid arteries are widely patent from skull base to carotid termini. ACAs and MCAs are patent without evidence of a proximal branch occlusion or significant proximal stenosis. No aneurysm is identified. Posterior circulation: The included portions of the intracranial vertebral arteries are widely patent to the basilar with the right being dominant. Patent AICA and SCA origins are visualized bilaterally. The basilar artery is widely patent. Posterior communicating arteries are diminutive or absent. Both PCAs are patent without evidence of a significant proximal stenosis. No aneurysm is identified. Anatomic variants: None. IMPRESSION: 1. No acute intracranial abnormality. 2. Mild chronic small vessel ischemic disease. 3. Negative head MRA. Electronically Signed   By: Sebastian Ache M.D.   On: 10/12/2022 13:06   MR ANGIO HEAD WO CONTRAST  Result Date: 10/12/2022 CLINICAL DATA:  Stroke/TIA, determine embolic source.  Transient ischemic attack (TIA). Neuro deficit, acute, stroke suspected. Headache and numbness. Right-sided weakness. EXAM: MRI HEAD WITHOUT CONTRAST MRA HEAD WITHOUT CONTRAST TECHNIQUE: Multiplanar, multi-echo pulse sequences of the brain and surrounding structures were acquired without intravenous contrast. Angiographic images of the Circle of Willis were acquired using MRA technique without intravenous contrast. COMPARISON:  Head CT 10/12/2022.  Head MRI and MRA 12/13/2021. FINDINGS: MRI HEAD FINDINGS Brain: There is no evidence of an acute infarct, intracranial hemorrhage, mass, midline shift, or extra-axial fluid collection. T2 hyperintensities in the left greater than right cerebral white matter are unchanged from the prior MRI and are nonspecific but compatible with mild chronic small vessel ischemic disease. The ventricles and sulci are normal. Vascular: Major intracranial vascular flow voids are preserved. Skull and upper cervical spine: No suspicious marrow lesion. Sinuses/Orbits: Unremarkable orbits. Paranasal sinuses and mastoid air cells are clear. Other: None. MRA HEAD FINDINGS Anterior circulation: The internal carotid arteries are widely patent from skull base to carotid termini. ACAs and MCAs are patent without evidence of a proximal branch occlusion or significant proximal stenosis. No aneurysm is identified. Posterior circulation: The included portions of the intracranial vertebral arteries are widely patent to the basilar with the right being dominant. Patent AICA and SCA origins are visualized bilaterally. The basilar artery is widely patent. Posterior communicating arteries are diminutive or absent. Both PCAs are patent without evidence of a significant proximal stenosis. No aneurysm is identified. Anatomic variants: None. IMPRESSION: 1. No acute intracranial abnormality. 2. Mild chronic small vessel ischemic disease. 3. Negative head MRA. Electronically Signed   By: Sebastian Ache M.D.   On:  10/12/2022 13:06   CT HEAD CODE STROKE WO CONTRAST  Result Date: 10/12/2022 CLINICAL DATA:  Code stroke. Neuro deficit, acute, stroke suspected R sided weakness, aphasia EXAM: CT HEAD WITHOUT CONTRAST TECHNIQUE: Contiguous axial images were obtained from the base of the skull through the vertex without intravenous contrast. RADIATION DOSE REDUCTION: This exam was performed according to the departmental dose-optimization program which includes automated exposure control, adjustment of the mA and/or kV according to patient size and/or use of iterative reconstruction technique. COMPARISON:  CT head 12/13/2021. FINDINGS: Brain: No evidence of acute large vascular territory infarction, hemorrhage, hydrocephalus, extra-axial collection or mass lesion/mass effect. Vascular: No hyperdense vessel. Skull: No acute fracture. Sinuses/Orbits: Clear sinuses.  No acute orbital findings. Other: No mastoid effusions. ASPECTS [facility] Stroke Program Early CT Score) total score (0-10 with 10 being normal): 10. IMPRESSION: 1. No evidence of acute intracranial abnormality.  2. ASPECTS is 10. Code stroke imaging results were communicated on 10/12/2022 at 11:37 am to provider Dr. Selina Cooley Via secure text paging. Electronically Signed   By: Feliberto Harts M.D.   On: 10/12/2022 11:37    Procedures Procedures    Medications Ordered in ED Medications   stroke: early stages of recovery book (has no administration in time range)  metoCLOPramide (REGLAN) injection 10 mg (10 mg Intravenous Given 10/12/22 1152)  diphenhydrAMINE (BENADRYL) injection 25 mg (25 mg Intravenous Given 10/12/22 1152)  lactated ringers bolus 1,000 mL (0 mLs Intravenous Stopped 10/12/22 1449)  LORazepam (ATIVAN) injection 1 mg (1 mg Intravenous Given 10/12/22 1214)    ED Course/ Medical Decision Making/ A&P                             Medical Decision Making Amount and/or Complexity of Data Reviewed External Data Reviewed: notes. Labs: ordered.     Details: Chronic anemia Radiology: ordered and independent interpretation performed.    Details: No head bleed on head CT ECG/medicine tests: ordered and independent interpretation performed.    Details: Sinus rhythma  Risk Prescription drug management.   Patient presents as a code stroke however I suspect this is more complicated migraine versus functional.  CT head unremarkable.  She just had a carotid ultrasound and an echo within the last few days.  She has not yet started the heart monitor but is in sinus rhythm here.  Neurology recommends MRA and MRI of brain and if unremarkable can discharge home.  Less likely this is a TIA.  She was given a headache cocktail and feels better.  Feels a little woozy after some of the medicines but is ambulating unremarkably.  Will discharge home with return precautions.        Final Clinical Impression(s) / ED Diagnoses Final diagnoses:  Paresthesia    Rx / DC Orders ED Discharge Orders          Ordered    Ambulatory referral to Neurology       Comments: An appointment is requested in approximately: 2 weeks   10/12/22 1423              Pricilla Loveless, MD 10/12/22 1553

## 2022-10-12 NOTE — Code Documentation (Signed)
Stroke Response Nurse Documentation Code Documentation  Barbara [name] is a 62 y.o. female arriving to [facility]  via [facility] on 10/12/2022 with past medical hx of hypertension, GERD, and sickle cell trait. Recent discharge from hospital for syncope. On No antithrombotic.    Patient from home where she was LKW at 1020 and now complaining of headache, right sides weakness/tingling and aphasia while taking a shower.Code stroke was activated by EMS.  Stroke team at the bedside on patient arrival. Labs drawn and patient cleared for CT by EDP. Patient to CT with team. NIHSS 1, see documentation for details and code stroke times. Patient with right leg weakness on exam. Reported during visual assessment examiners stationary fingers were moving in all quadrants. The following imaging was completed:  CT Head. Patient is not a candidate for IV Thrombolytic due to symptoms resolving.   Care Plan: Q53min VS and NIHSS until patient is outside window for TNK then Q2.  Bedside handoff with ED RN Arline Asp.    Ferman Hamming Stroke Response RN

## 2022-10-12 NOTE — ED Notes (Signed)
Patient transported to MRI 

## 2022-10-13 ENCOUNTER — Telehealth: Payer: Self-pay

## 2022-10-13 ENCOUNTER — Other Ambulatory Visit: Payer: Self-pay | Admitting: Family Medicine

## 2022-10-13 DIAGNOSIS — I10 Essential (primary) hypertension: Secondary | ICD-10-CM

## 2022-10-13 NOTE — Transitions of Care (Post Inpatient/ED Visit) (Signed)
   10/13/2022  Name: Barbara [name] MRN: [id number] DOB: 1961-01-17  Today's TOC FU Call Status: Today's TOC FU Call Status:: Successful TOC FU Call Competed TOC FU Call Complete Date: 10/13/22  Transition Care Management Follow-up Telephone Call Date of Discharge: 10/12/22 Discharge Facility: [facility]) Type of Discharge: Emergency Department How have you been since you were released from the hospital?: Better Any questions or concerns?: No  Items Reviewed: Did you receive and understand the discharge instructions provided?: Yes Medications obtained,verified, and reconciled?: No Any new allergies since your discharge?: No Dietary orders reviewed?: NA Do you have support at home?: Yes  Medications Reviewed Today: Medications Reviewed Today     Reviewed by Leigh Aurora, CMA (Certified Medical Assistant) on 10/11/22 at 1218  Med List Status: <None>   Medication Order Taking? Sig Documenting Provider Last Dose Status Informant  amLODipine (NORVASC) 5 MG tablet [id number] No Take 1 tablet (5 mg total) by mouth daily. Garnette Gunner, MD 10/08/2022 Active Self  cyclobenzaprine (FLEXERIL) 5 MG tablet [id number] No TAKE 1-2 TABLETS (5-10 MG TOTAL) BY MOUTH AT BEDTIME.  Patient taking differently: Take 5-10 mg by mouth at bedtime as needed for muscle spasms.   Garnette Gunner, MD unknown Active Self  diclofenac Sodium (VOLTAREN) 1 % GEL [id number] No APPLY 4 G TOPICALLY 4 (FOUR) TIMES DAILY AS NEEDED.  Patient taking differently: Apply 4 g topically 4 (four) times daily as needed (pain).   Garnette Gunner, MD unknown Active Self  dicyclomine (BENTYL) 20 MG tablet [id number] No Take 1 tablet (20 mg total) by mouth in the morning and at bedtime. Margaretann Loveless, [place] 10/08/2022 Active Self  famotidine (PEPCID) 40 MG tablet [id number] No TAKE 1 TABLET BY MOUTH EVERYDAY AT BEDTIME McElwee, Lauren A, NP 10/07/2022 Active Self  hydrochlorothiazide (HYDRODIURIL) 25 MG tablet  [id number] No Take 1 tablet (25 mg total) by mouth daily. Garnette Gunner, MD 10/08/2022 Active Self  lisinopril (ZESTRIL) 10 MG tablet [id number] No Take 1 tablet (10 mg total) by mouth daily. Garnette Gunner, MD 10/08/2022 Active Self  montelukast (SINGULAIR) 10 MG tablet [id number] No Take 1 tablet (10 mg total) by mouth at bedtime. Garnette Gunner, MD 10/07/2022 Active Self  pantoprazole (PROTONIX) 40 MG tablet [id number] No Take 40 mg by mouth daily. [provider] 10/08/2022 Active Self  rosuvastatin (CRESTOR) 10 MG tablet [id number] No Take 1 tablet (10 mg total) by mouth daily. Garnette Gunner, MD 10/08/2022 Active Self            Home Care and Equipment/Supplies: Were Home Health Services Ordered?: NA Any new equipment or medical supplies ordered?: NA  Functional Questionnaire: Do you need assistance with bathing/showering or dressing?: No Do you need assistance with meal preparation?: No Do you need assistance with eating?: No Do you have difficulty maintaining continence: No Do you need assistance with getting out of bed/getting out of a chair/moving?: No Do you have difficulty managing or taking your medications?: No  Follow up appointments reviewed: PCP Follow-up appointment confirmed?: Yes Date of PCP follow-up appointment?: 10/19/22 Follow-up Provider: Dr. Janee Morn [facility] Follow-up appointment confirmed?: No Do you need transportation to your follow-up appointment?: No Do you understand care options if your condition(s) worsen?: Yes-patient verbalized understanding    SIGNATURE Arvil Persons, BSN, RN

## 2022-10-14 NOTE — Telephone Encounter (Signed)
error 

## 2022-10-18 NOTE — Transitions of Care (Post Inpatient/ED Visit) (Deleted)
   10/18/2022  Name: Barbara [name] MRN: [id number] DOB: 04-27-1961  {[id number]}

## 2022-10-18 NOTE — Transitions of Care (Post Inpatient/ED Visit) (Signed)
10/18/2022  Name: Barbara [name] MRN: [id number] DOB: Apr 02, 1961  Today's TOC FU Call Status: Today's TOC FU Call Status:: Successful TOC FU Call Competed TOC FU Call Complete Date: 10/18/22  Transition Care Management Follow-up Telephone Call Date of Discharge: 10/10/22 Discharge Facility: [facility]) Type of Discharge: Inpatient Admission Primary Inpatient Discharge Diagnosis:: paraesthesis How have you been since you were released from the hospital?: Better Any questions or concerns?: Yes Patient Questions/Concerns:: Pt wants a note to release her to work today. Advised pt to contact Cardiology for a note to rtw and if she has issues to give [place] a call. Patient Questions/Concerns Addressed: Other:  Items Reviewed: Did you receive and understand the discharge instructions provided?: Yes Medications obtained,verified, and reconciled?: No Any new allergies since your discharge?: No Dietary orders reviewed?: NA Do you have support at home?: Yes  Medications Reviewed Today: Medications Reviewed Today     Reviewed by Leigh Aurora, CMA (Certified Medical Assistant) on 10/11/22 at 1218  Med List Status: <None>   Medication Order Taking? Sig Documenting Provider Last Dose Status Informant  amLODipine (NORVASC) 5 MG tablet [id number] No Take 1 tablet (5 mg total) by mouth daily. Garnette Gunner, MD 10/08/2022 Active Self  cyclobenzaprine (FLEXERIL) 5 MG tablet [id number] No TAKE 1-2 TABLETS (5-10 MG TOTAL) BY MOUTH AT BEDTIME.  Patient taking differently: Take 5-10 mg by mouth at bedtime as needed for muscle spasms.   Garnette Gunner, MD unknown Active Self  diclofenac Sodium (VOLTAREN) 1 % GEL [id number] No APPLY 4 G TOPICALLY 4 (FOUR) TIMES DAILY AS NEEDED.  Patient taking differently: Apply 4 g topically 4 (four) times daily as needed (pain).   Garnette Gunner, MD unknown Active Self  dicyclomine (BENTYL) 20 MG tablet [id number] No Take 1 tablet (20 mg total) by mouth  in the morning and at bedtime. Margaretann Loveless, [place] 10/08/2022 Active Self  famotidine (PEPCID) 40 MG tablet [id number] No TAKE 1 TABLET BY MOUTH EVERYDAY AT BEDTIME McElwee, Lauren A, NP 10/07/2022 Active Self  hydrochlorothiazide (HYDRODIURIL) 25 MG tablet [id number] No Take 1 tablet (25 mg total) by mouth daily. Garnette Gunner, MD 10/08/2022 Active Self  lisinopril (ZESTRIL) 10 MG tablet [id number] No Take 1 tablet (10 mg total) by mouth daily. Garnette Gunner, MD 10/08/2022 Active Self  montelukast (SINGULAIR) 10 MG tablet [id number] No Take 1 tablet (10 mg total) by mouth at bedtime. Garnette Gunner, MD 10/07/2022 Active Self  pantoprazole (PROTONIX) 40 MG tablet [id number] No Take 40 mg by mouth daily. [provider] 10/08/2022 Active Self  rosuvastatin (CRESTOR) 10 MG tablet [id number] No Take 1 tablet (10 mg total) by mouth daily. Garnette Gunner, MD 10/08/2022 Active Self            Home Care and Equipment/Supplies: Were Home Health Services Ordered?: NA Any new equipment or medical supplies ordered?: Yes Name of Medical supply agency?: heart monitor Were you able to get the equipment/medical supplies?: Yes Do you have any questions related to the use of the equipment/supplies?: No  Functional Questionnaire: Do you need assistance with bathing/showering or dressing?: No Do you need assistance with meal preparation?: No Do you need assistance with eating?: No Do you have difficulty maintaining continence: No Do you need assistance with getting out of bed/getting out of a chair/moving?: No Do you have difficulty managing or taking your medications?: No  Follow up appointments reviewed: Date of PCP follow-up appointment?: 10/19/22 Follow-up Provider: Dr. Janee Morn Specialist  Hospital Follow-up appointment confirmed?: Yes Date of Specialist follow-up appointment?: 11/15/22 Follow-Up Specialty Provider:: Dutch Gray Do you need transportation to your follow-up  appointment?: No Do you understand care options if your condition(s) worsen?: Yes-patient verbalized understanding    SIGNATURE Arvil Persons, BSN, RN

## 2022-10-19 ENCOUNTER — Ambulatory Visit (INDEPENDENT_AMBULATORY_CARE_PROVIDER_SITE_OTHER): Payer: 59 | Admitting: Family Medicine

## 2022-10-19 ENCOUNTER — Encounter: Payer: Self-pay | Admitting: Family Medicine

## 2022-10-19 VITALS — BP 132/84 | HR 74 | Temp 97.6°F | Wt 148.4 lb

## 2022-10-19 DIAGNOSIS — R55 Syncope and collapse: Secondary | ICD-10-CM | POA: Diagnosis not present

## 2022-10-19 DIAGNOSIS — K219 Gastro-esophageal reflux disease without esophagitis: Secondary | ICD-10-CM

## 2022-10-19 DIAGNOSIS — I7300 Raynaud's syndrome without gangrene: Secondary | ICD-10-CM

## 2022-10-19 DIAGNOSIS — R197 Diarrhea, unspecified: Secondary | ICD-10-CM | POA: Diagnosis not present

## 2022-10-19 DIAGNOSIS — Z09 Encounter for follow-up examination after completed treatment for conditions other than malignant neoplasm: Secondary | ICD-10-CM

## 2022-10-19 MED ORDER — FAMOTIDINE 40 MG PO TABS
40.0000 mg | ORAL_TABLET | Freq: Every day | ORAL | 1 refills | Status: DC
Start: 2022-10-19 — End: 2022-12-28

## 2022-10-19 MED ORDER — DICYCLOMINE HCL 20 MG PO TABS: 20.0000 mg | ORAL_TABLET | Freq: Two times a day (BID) | ORAL | 0 refills | Status: DC

## 2022-10-19 NOTE — Assessment & Plan Note (Addendum)
Plan:  Follow-up with neurology to explore possible neuropathic or vascular causes. Continue amlodipine 5 mg Maintain potassium balance and monitor for other electrolyte abnormalities.

## 2022-10-19 NOTE — Assessment & Plan Note (Signed)
Probable related to electrolyte imbalance, particularly low potassium, but cardiac and neurologic origin not ruled out.  Plan:  Continue wearing the Zio patch for cardiac monitoring and review results at follow-up. Ensure adequate hydration and maintain normal potassium levels through diet. Follow up with cardiology and neurology as scheduled.

## 2022-10-19 NOTE — Patient Instructions (Signed)
Follow up with cardiology on June 10th for further evaluation, including the results of your ZEO patch monitor. Stay in regular communication with neurology to address ongoing numbness and tingling in your extremities. Keep taking your prescribed medications for heartburn: Famotidine 40 mg and Bentyl (Dicyclomine) as needed for crampiness. Ensure adequate hydration and track your symptoms, especially concerning weight loss, diarrhea, and chest pain. If symptoms persist or worsen, consider consulting with a gastroenterologist (GI) for further evaluation. If you have any concerns or new symptoms, please contact our office promptly.

## 2022-10-19 NOTE — Assessment & Plan Note (Signed)
With chronic abdominal pain And GI bleeding Takes Protonix 40 mg Requesting refill including 40 mg and Bentyl 10 mg as needed Encourage patient follow-up with GI Patient follow-up after that as needed

## 2022-10-19 NOTE — Progress Notes (Signed)
Assessment/Plan:   Problem List Items Addressed This Visit       Cardiovascular and Mediastinum   Primary hypertension   Relevant Medications   famotidine (PEPCID) 40 MG tablet     Digestive   GERD (gastroesophageal reflux disease)    With chronic abdominal pain And GI bleeding Takes Protonix 40 mg Requesting refill including 40 mg and Bentyl 10 mg as needed Encourage patient follow-up with GI Patient follow-up after that as needed      Relevant Medications   famotidine (PEPCID) 40 MG tablet   dicyclomine (BENTYL) 20 MG tablet   Other Relevant Orders   Ambulatory referral to Gastroenterology     Other   Raynaud's phenomenon without gangrene    Plan:  Follow-up with neurology to explore possible neuropathic or vascular causes. Continue amlodipine 5 mg Maintain potassium balance and monitor for other electrolyte abnormalities.      Syncope and collapse    Probable related to electrolyte imbalance, particularly low potassium, but cardiac and neurologic origin not ruled out.  Plan:  Continue wearing the Zio patch for cardiac monitoring and review results at follow-up. Ensure adequate hydration and maintain normal potassium levels through diet. Follow up with cardiology and neurology as scheduled.       Diarrhea   Relevant Medications   dicyclomine (BENTYL) 20 MG tablet   Other Relevant Orders   Ambulatory referral to Gastroenterology   Other Visit Diagnoses     Hospital discharge follow-up    -  Primary       Medications Discontinued During This Encounter  Medication Reason   famotidine (PEPCID) 40 MG tablet Reorder   dicyclomine (BENTYL) 20 MG tablet Reorder    Return if symptoms worsen or fail to improve.    Subjective:   Encounter date: 10/19/2022  Barbara [name] is a 62 y.o. female who has GERD (gastroesophageal reflux disease); Abnormal uterine bleeding (AUB); Primary hypertension; Lower back pain; Onychomycosis; Raynaud's phenomenon without  gangrene; Syncope and collapse; Hypokalemia; Prolonged QT interval; and Diarrhea on their problem list..   She  has a past medical history of Allergy (1965), Anemia (1980), Arthritis (2007), GERD (gastroesophageal reflux disease), Hypertension, Low iron, Sickle cell trait (HCC), Ulcer (1981), and Viral gastroenteritis (07/24/2022)..   She presents with chief complaint of Follow-up (ED Follow up Syncope. Patient states that she still has chest pain/ heart burn , sob and heaviness. Rx refill on famotidine and dicyclomine) .   Follow up Hospitalization  Patient was admitted to [facility] on 10/08/2022 and discharged on 10/10/2022. She was treated for syncope and loss of consciousness and hypokalemia. Treatment for this included electrolyte repletion and evaluation by cardiology. Telephone follow up was done on 10/11/2022 She reports excellent compliance with //treatment. She reports this condition is improved.  Fainting spells. The patient had an episode of near syncope on the 18th, which led to hospitalization due to changes on EKG and low potassium levels. The patient was seen by cardiology, and a subsequent similar episode occurred a few days later. EKG changes were noted possibly due to low potassium, but levels normalized after repletion. Patient wears a Zeo patch for monitoring, with results pending. Echocardiogram showed no structural abnormalities.  Chest pain. The patient reports persistent chest discomfort nightly. Episodes were not attributed to cardiac issues, potentially related to heartburn.  Tingling and numbness in extremities. Patient continues to experience these symptoms. No conclusive diagnosis yet, but neurology follow-up is planned. The possibility of Raynaud's phenomenon was considered.  Heartburn.  Patient is taking pantoprazole 40 mg and famotidine 40 mg daily and uses dicyclomine 10 mg as needed ,which has provided some relief.  Weight loss. The patient reports  unintentional weight loss without effort. They have a significant history of diarrhea contributing to electrolyte imbalances and heartburn.  Review of Systems  Constitutional:  Positive for malaise/fatigue and weight loss.  Respiratory:  Negative for cough, shortness of breath and wheezing.   Cardiovascular:  Positive for chest pain.  Gastrointestinal:  Positive for diarrhea and heartburn.  Neurological:  Positive for dizziness and loss of consciousness.    Past Surgical History:  Procedure Laterality Date   ANTERIOR CRUCIATE LIGAMENT REPAIR     CHOLECYSTECTOMY     ENDOMETRIAL ABLATION     LEFT HEART CATHETERIZATION WITH CORONARY ANGIOGRAM N/A 08/27/2014   Procedure: LEFT HEART CATHETERIZATION WITH CORONARY ANGIOGRAM;  Surgeon: Iran Ouch, MD;  Location: [facility] CATH [facility];  Service: Cardiovascular;  Laterality: N/A;   TUBAL LIGATION  1980    Outpatient Medications Prior to Visit  Medication Sig Dispense Refill   amLODipine (NORVASC) 5 MG tablet Take 1 tablet (5 mg total) by mouth daily. 90 tablet 0   cyclobenzaprine (FLEXERIL) 5 MG tablet TAKE 1-2 TABLETS (5-10 MG TOTAL) BY MOUTH AT BEDTIME. (Patient taking differently: Take 5-10 mg by mouth at bedtime as needed for muscle spasms.) 30 tablet 1   diclofenac Sodium (VOLTAREN) 1 % GEL APPLY 4 G TOPICALLY 4 (FOUR) TIMES DAILY AS NEEDED. (Patient taking differently: Apply 4 g topically 4 (four) times daily as needed (pain).) 100 g 3   hydrochlorothiazide (HYDRODIURIL) 25 MG tablet Take 1 tablet (25 mg total) by mouth daily. 90 tablet 3   lisinopril (ZESTRIL) 10 MG tablet Take 1 tablet (10 mg total) by mouth daily. 180 tablet 1   montelukast (SINGULAIR) 10 MG tablet Take 1 tablet (10 mg total) by mouth at bedtime. 90 tablet 0   pantoprazole (PROTONIX) 40 MG tablet Take 40 mg by mouth daily.     rosuvastatin (CRESTOR) 10 MG tablet Take 1 tablet (10 mg total) by mouth daily. 90 tablet 3   dicyclomine (BENTYL) 20 MG tablet Take 1 tablet (20 mg  total) by mouth in the morning and at bedtime. (Patient not taking: Reported on 10/19/2022) 20 tablet 0   famotidine (PEPCID) 40 MG tablet TAKE 1 TABLET BY MOUTH EVERYDAY AT BEDTIME (Patient not taking: Reported on 10/19/2022) 90 tablet 1   No facility-administered medications prior to visit.    Family History  Problem Relation Age of Onset   CAD Brother 35       CABG at age 77, and deceased   Heart disease Brother    Hypertension Brother    CAD Sister 67       1 stent   CAD Father 5       Deceased in 58s   Heart disease Father    Hypertension Father    Cancer Mother    Heart disease Brother    Hypertension Brother    Hypertension Daughter    Hypertension Maternal Aunt    Stroke Maternal Aunt    Hypertension Paternal Uncle     Social History   Socioeconomic History   Marital status: Divorced    Spouse name: Not on file   Number of children: 2   Years of education: Not on file   Highest education level: Bachelor's degree (e.g., BA, AB, BS)  Occupational History   Not on file  Tobacco Use  Smoking status: Former    Packs/day: 0.00    Years: 2.00    Additional pack years: 0.00    Total pack years: 0.00    Types: Cigarettes    Quit date: 08/26/2013    Years since quitting: 9.1   Smokeless tobacco: Never  Vaping Use   Vaping Use: Never used  Substance and Sexual Activity   Alcohol use: Yes    Comment: occasionally, glass of wine   Drug use: No   Sexual activity: Yes    Birth control/protection: Surgical  Other Topics Concern   Not on file  Social History Narrative   Not on file   Social Determinants of Health   Financial Resource Strain: Low Risk  (10/15/2022)   Overall Financial Resource Strain (CARDIA)    Difficulty of Paying Living Expenses: Not very hard  Food Insecurity: No Food Insecurity (10/15/2022)   Hunger Vital Sign    Worried About Running Out of Food in the Last Year: Never true    Ran Out of Food in the Last Year: Never true  Transportation  Needs: No Transportation Needs (10/15/2022)   PRAPARE - Administrator, Civil Service (Medical): No    Lack of Transportation (Non-Medical): No  Physical Activity: Sufficiently Active (10/15/2022)   Exercise Vital Sign    Days of Exercise per Week: 5 days    Minutes of Exercise per Session: 50 min  Stress: Stress Concern Present (10/15/2022)   [employer] of Occupational Health - Occupational Stress Questionnaire    Feeling of Stress : Rather much  Social Connections: Moderately Isolated (10/15/2022)   Social Connection and Isolation Panel [NHANES]    Frequency of Communication with Friends and Family: More than three times a week    Frequency of Social Gatherings with Friends and Family: More than three times a week    Attends Religious Services: 1 to 4 times per year    Active Member of [employer] or Organizations: No    Attends Banker Meetings: Not on file    Marital Status: Divorced  Intimate Partner Violence: Not At Risk (10/09/2022)   Humiliation, Afraid, Rape, and Kick questionnaire    Fear of Current or Ex-Partner: No    Emotionally Abused: No    Physically Abused: No    Sexually Abused: No                                                                                                  Objective:  Physical Exam: BP 132/84 (BP Location: Left Arm, Patient Position: Sitting, Cuff Size: Large)   Pulse 74   Temp 97.6 F (36.4 C) (Temporal)   Wt 148 lb 6.4 oz (67.3 kg)   SpO2 100%   BMI 21.91 kg/m   Wt Readings from Last 3 Encounters:  10/19/22 148 lb 6.4 oz (67.3 kg)  10/12/22 155 lb 3.3 oz (70.4 kg)  10/09/22 155 lb 3.3 oz (70.4 kg)     Physical Exam Constitutional:      General: She is not in acute distress.    Appearance: Normal appearance. She  is not ill-appearing or toxic-appearing.  HENT:     Head: Normocephalic and atraumatic.     Nose: Nose normal. No congestion.  Eyes:     General: No scleral icterus.    Extraocular Movements:  Extraocular movements intact.  Cardiovascular:     Rate and Rhythm: Normal rate and regular rhythm.     Pulses: Normal pulses.     Heart sounds: Normal heart sounds.  Pulmonary:     Effort: Pulmonary effort is normal. No respiratory distress.     Breath sounds: Normal breath sounds.  Abdominal:     General: Abdomen is flat. Bowel sounds are normal.     Palpations: Abdomen is soft.  Musculoskeletal:        General: Normal range of motion.  Lymphadenopathy:     Cervical: No cervical adenopathy.  Skin:    General: Skin is warm and dry.     Findings: No rash.  Neurological:     General: No focal deficit present.     Mental Status: She is alert and oriented to person, place, and time. Mental status is at baseline.  Psychiatric:        Mood and Affect: Mood normal.        Behavior: Behavior normal.        Thought Content: Thought content normal.        Judgment: Judgment normal.     MR BRAIN WO CONTRAST  Result Date: 10/12/2022 CLINICAL DATA:  Stroke/TIA, determine embolic source. Transient ischemic attack (TIA). Neuro deficit, acute, stroke suspected. Headache and numbness. Right-sided weakness. EXAM: MRI HEAD WITHOUT CONTRAST MRA HEAD WITHOUT CONTRAST TECHNIQUE: Multiplanar, multi-echo pulse sequences of the brain and surrounding structures were acquired without intravenous contrast. Angiographic images of the Circle of Willis were acquired using MRA technique without intravenous contrast. COMPARISON:  Head CT 10/12/2022.  Head MRI and MRA 12/13/2021. FINDINGS: MRI HEAD FINDINGS Brain: There is no evidence of an acute infarct, intracranial hemorrhage, mass, midline shift, or extra-axial fluid collection. T2 hyperintensities in the left greater than right cerebral white matter are unchanged from the prior MRI and are nonspecific but compatible with mild chronic small vessel ischemic disease. The ventricles and sulci are normal. Vascular: Major intracranial vascular flow voids are preserved.  Skull and upper cervical spine: No suspicious marrow lesion. Sinuses/Orbits: Unremarkable orbits. Paranasal sinuses and mastoid air cells are clear. Other: None. MRA HEAD FINDINGS Anterior circulation: The internal carotid arteries are widely patent from skull base to carotid termini. ACAs and MCAs are patent without evidence of a proximal branch occlusion or significant proximal stenosis. No aneurysm is identified. Posterior circulation: The included portions of the intracranial vertebral arteries are widely patent to the basilar with the right being dominant. Patent AICA and SCA origins are visualized bilaterally. The basilar artery is widely patent. Posterior communicating arteries are diminutive or absent. Both PCAs are patent without evidence of a significant proximal stenosis. No aneurysm is identified. Anatomic variants: None. IMPRESSION: 1. No acute intracranial abnormality. 2. Mild chronic small vessel ischemic disease. 3. Negative head MRA. Electronically Signed   By: Sebastian Ache M.D.   On: 10/12/2022 13:06   MR ANGIO HEAD WO CONTRAST  Result Date: 10/12/2022 CLINICAL DATA:  Stroke/TIA, determine embolic source. Transient ischemic attack (TIA). Neuro deficit, acute, stroke suspected. Headache and numbness. Right-sided weakness. EXAM: MRI HEAD WITHOUT CONTRAST MRA HEAD WITHOUT CONTRAST TECHNIQUE: Multiplanar, multi-echo pulse sequences of the brain and surrounding structures were acquired without intravenous contrast. Angiographic images of the  Circle of Willis were acquired using MRA technique without intravenous contrast. COMPARISON:  Head CT 10/12/2022.  Head MRI and MRA 12/13/2021. FINDINGS: MRI HEAD FINDINGS Brain: There is no evidence of an acute infarct, intracranial hemorrhage, mass, midline shift, or extra-axial fluid collection. T2 hyperintensities in the left greater than right cerebral white matter are unchanged from the prior MRI and are nonspecific but compatible with mild chronic small  vessel ischemic disease. The ventricles and sulci are normal. Vascular: Major intracranial vascular flow voids are preserved. Skull and upper cervical spine: No suspicious marrow lesion. Sinuses/Orbits: Unremarkable orbits. Paranasal sinuses and mastoid air cells are clear. Other: None. MRA HEAD FINDINGS Anterior circulation: The internal carotid arteries are widely patent from skull base to carotid termini. ACAs and MCAs are patent without evidence of a proximal branch occlusion or significant proximal stenosis. No aneurysm is identified. Posterior circulation: The included portions of the intracranial vertebral arteries are widely patent to the basilar with the right being dominant. Patent AICA and SCA origins are visualized bilaterally. The basilar artery is widely patent. Posterior communicating arteries are diminutive or absent. Both PCAs are patent without evidence of a significant proximal stenosis. No aneurysm is identified. Anatomic variants: None. IMPRESSION: 1. No acute intracranial abnormality. 2. Mild chronic small vessel ischemic disease. 3. Negative head MRA. Electronically Signed   By: Sebastian Ache M.D.   On: 10/12/2022 13:06   CT HEAD CODE STROKE WO CONTRAST  Result Date: 10/12/2022 CLINICAL DATA:  Code stroke. Neuro deficit, acute, stroke suspected R sided weakness, aphasia EXAM: CT HEAD WITHOUT CONTRAST TECHNIQUE: Contiguous axial images were obtained from the base of the skull through the vertex without intravenous contrast. RADIATION DOSE REDUCTION: This exam was performed according to the departmental dose-optimization program which includes automated exposure control, adjustment of the mA and/or kV according to patient size and/or use of iterative reconstruction technique. COMPARISON:  CT head 12/13/2021. FINDINGS: Brain: No evidence of acute large vascular territory infarction, hemorrhage, hydrocephalus, extra-axial collection or mass lesion/mass effect. Vascular: No hyperdense vessel.  Skull: No acute fracture. Sinuses/Orbits: Clear sinuses.  No acute orbital findings. Other: No mastoid effusions. ASPECTS [facility] Stroke Program Early CT Score) total score (0-10 with 10 being normal): 10. IMPRESSION: 1. No evidence of acute intracranial abnormality. 2. ASPECTS is 10. Code stroke imaging results were communicated on 10/12/2022 at 11:37 am to provider Dr. Selina Cooley Via secure text paging. Electronically Signed   By: Feliberto Harts M.D.   On: 10/12/2022 11:37   VAS US CAROTID  Result Date: 10/10/2022 Carotid Arterial Duplex Study Patient Name:  BRENLEY GARDELL  Date of Exam:   10/09/2022 Medical Rec #: [id number]      Accession #:    [id number] Date of Birth: 11-10-1960      Patient Gender: F Patient Age:   30 years Exam Location:  [facility] Procedure:      VAS US CAROTID Referring Phys: DEBBY CROSLEY --------------------------------------------------------------------------------  Indications:       Syncope. Risk Factors:      Past history of smoking. Comparison Study:  No prior studies. Performing Technologist: Jean Rosenthal RDMS, RVT  Examination Guidelines: A complete evaluation includes B-mode imaging, spectral Doppler, color Doppler, and power Doppler as needed of all accessible portions of each vessel. Bilateral testing is considered an integral part of a complete examination. Limited examinations for reoccurring indications may be performed as noted.  Right Carotid Findings: +----------+--------+--------+--------+------------------+------------------+           PSV cm/sEDV cm/sStenosisPlaque  DescriptionComments           +----------+--------+--------+--------+------------------+------------------+ CCA Prox  123     30                                                   +----------+--------+--------+--------+------------------+------------------+ CCA Distal120     31                                intimal thickening  +----------+--------+--------+--------+------------------+------------------+ ICA Prox  99      31                                                   +----------+--------+--------+--------+------------------+------------------+ ICA Distal119     41                                                   +----------+--------+--------+--------+------------------+------------------+ ECA       86      23                                                   +----------+--------+--------+--------+------------------+------------------+ +----------+--------+-------+----------------+-------------------+           PSV cm/sEDV cmsDescribe        Arm Pressure (mmHG) +----------+--------+-------+----------------+-------------------+ [id number]            Multiphasic, WNL                    +----------+--------+-------+----------------+-------------------+ +---------+--------+--+--------+--+---------+ VertebralPSV cm/s87EDV cm/s35Antegrade +---------+--------+--+--------+--+---------+  Left Carotid Findings: +----------+--------+--------+--------+------------------+------------------+           PSV cm/sEDV cm/sStenosisPlaque DescriptionComments           +----------+--------+--------+--------+------------------+------------------+ CCA Prox  126     28                                                   +----------+--------+--------+--------+------------------+------------------+ CCA Distal106     29                                intimal thickening +----------+--------+--------+--------+------------------+------------------+ ICA Prox  112     37                                                   +----------+--------+--------+--------+------------------+------------------+ ICA Distal102     41                                                   +----------+--------+--------+--------+------------------+------------------+  ECA       97      16                                                    +----------+--------+--------+--------+------------------+------------------+ +----------+--------+--------+----------------+-------------------+           PSV cm/sEDV cm/sDescribe        Arm Pressure (mmHG) +----------+--------+--------+----------------+-------------------+ [id number]             Multiphasic, WNL                    +----------+--------+--------+----------------+-------------------+ +---------+--------+--+--------+--+---------+ VertebralPSV cm/s94EDV cm/s25Antegrade +---------+--------+--+--------+--+---------+   Summary: Right Carotid: The extracranial vessels were near-normal with only minimal wall                thickening or plaque. Left Carotid: The extracranial vessels were near-normal with only minimal wall               thickening or plaque. Vertebrals:  Bilateral vertebral arteries demonstrate antegrade flow. Subclavians: Normal flow hemodynamics were seen in bilateral subclavian              arteries. *See table(s) above for measurements and observations.  Electronically signed by Sherald Hess MD on 10/10/2022 at 9:15:00 AM.    Final    ECHOCARDIOGRAM COMPLETE  Result Date: 10/09/2022    ECHOCARDIOGRAM REPORT   Patient Name:   ZAYIAH ZAJAC Date of Exam: 10/09/2022 Medical Rec #:  [id number]     Height:       69.0 in Accession #:    [id number]    Weight:       155.2 lb Date of Birth:  11-20-1960     BSA:          1.855 m Patient Age:    61 years      BP:           107/68 mmHg Patient Gender: F             HR:           77 bpm. Exam Location:  Inpatient Procedure: 2D Echo, Cardiac Doppler and Color Doppler Indications:    syncope  History:        Patient has no prior history of Echocardiogram examinations.                 Risk Factors:Hypertension.  Sonographer:    Mike Gip Referring Phys: [phone] DEBBY CROSLEY IMPRESSIONS  1. Left ventricular ejection fraction, by estimation, is 60 to 65%. The left ventricle has normal function.  The left ventricle has no regional wall motion abnormalities. Left ventricular diastolic parameters were normal.  2. Right ventricular systolic function is normal. The right ventricular size is normal. There is normal pulmonary artery systolic pressure.  3. The mitral valve is normal in structure. No evidence of mitral valve regurgitation.  4. The aortic valve is normal in structure. Aortic valve regurgitation is not visualized.  5. The inferior vena cava is normal in size with greater than 50% respiratory variability, suggesting right atrial pressure of 3 mmHg. Conclusion(s)/Recommendation(s): Normal biventricular function without evidence of hemodynamically significant valvular heart disease. FINDINGS  Left Ventricle: Left ventricular ejection fraction, by estimation, is 60 to 65%. The left ventricle has normal function. The left ventricle has no regional wall motion abnormalities. The left ventricular internal cavity  size was normal in size. There is  no left ventricular hypertrophy. Left ventricular diastolic parameters were normal. Right Ventricle: The right ventricular size is normal. Right ventricular systolic function is normal. There is normal pulmonary artery systolic pressure. The tricuspid regurgitant velocity is 2.71 m/s, and with an assumed right atrial pressure of 3 mmHg,  the estimated right ventricular systolic pressure is 32.4 mmHg. Left Atrium: Left atrial size was normal in size. Right Atrium: Right atrial size was normal in size. Pericardium: There is no evidence of pericardial effusion. Mitral Valve: The mitral valve is normal in structure. No evidence of mitral valve regurgitation. Tricuspid Valve: The tricuspid valve is normal in structure. Tricuspid valve regurgitation is mild. Aortic Valve: The aortic valve is normal in structure. Aortic valve regurgitation is not visualized. Pulmonic Valve: Pulmonic valve regurgitation is not visualized. Aorta: The aortic root and ascending aorta are  structurally normal, with no evidence of dilitation. Venous: The inferior vena cava is normal in size with greater than 50% respiratory variability, suggesting right atrial pressure of 3 mmHg. IAS/Shunts: The interatrial septum was not well visualized.  LEFT VENTRICLE PLAX 2D LVIDd:         4.60 cm     Diastology LVIDs:         2.70 cm     LV e' medial:    10.20 cm/s LV PW:         1.00 cm     LV E/e' medial:  8.4 LV IVS:        1.00 cm     LV e' lateral:   14.00 cm/s LVOT diam:     1.80 cm     LV E/e' lateral: 6.1 LV SV:         68 LV SV Index:   36 LVOT Area:     2.54 cm  LV Volumes (MOD) LV vol d, MOD A2C: 83.9 ml LV vol d, MOD A4C: 71.5 ml LV vol s, MOD A2C: 24.8 ml LV vol s, MOD A4C: 28.2 ml LV SV MOD A2C:     59.1 ml LV SV MOD A4C:     71.5 ml LV SV MOD BP:      51.4 ml RIGHT VENTRICLE             IVC RV Basal diam:  3.90 cm     IVC diam: 2.00 cm RV S prime:     15.00 cm/s TAPSE (M-mode): 2.4 cm LEFT ATRIUM             Index        RIGHT ATRIUM           Index LA diam:        3.50 cm 1.89 cm/m   RA Area:     16.20 cm LA Vol (A2C):   42.0 ml 22.64 ml/m  RA Volume:   39.50 ml  21.29 ml/m LA Vol (A4C):   39.0 ml 21.03 ml/m LA Biplane Vol: 43.3 ml 23.34 ml/m  AORTIC VALVE LVOT Vmax:   138.00 cm/s LVOT Vmean:  88.800 cm/s LVOT VTI:    0.266 m  AORTA Ao Root diam: 3.10 cm Ao Asc diam:  2.80 cm MITRAL VALVE               TRICUSPID VALVE MV Area (PHT): 3.43 cm    TR Peak grad:   29.4 mmHg MV Decel Time: 221 msec    TR Vmax:  271.00 cm/s MV E velocity: 86.10 cm/s MV A velocity: 88.30 cm/s  SHUNTS MV E/A ratio:  0.98        Systemic VTI:  0.27 m                            Systemic Diam: 1.80 cm Carolan Clines Electronically signed by Carolan Clines Signature Date/Time: 10/09/2022/10:50:47 AM    Final    DG Chest Port 1 View  Result Date: 10/08/2022 CLINICAL DATA:  Syncope EXAM: PORTABLE CHEST 1 VIEW COMPARISON:  Chest radiographs 12/13/2021 FINDINGS: Stable cardiomediastinal silhouette. No focal  consolidation, pleural effusion, or pneumothorax. No displaced rib fractures. IMPRESSION: No active disease. Electronically Signed   By: Minerva Fester M.D.   On: 10/08/2022 23:09    Recent Results (from the past 2160 hour(s))  CBC with Differential     Status: Abnormal   Collection Time: 10/08/22 10:39 PM  Result Value Ref Range   WBC 7.4 4.0 - 10.5 K/uL   RBC 3.04 (L) 3.87 - 5.11 MIL/uL   Hemoglobin 8.9 (L) 12.0 - 15.0 g/dL   HCT 60.4 (L) 54.0 - 98.1 %   MCV 84.2 80.0 - 100.0 fL   MCH 29.3 26.0 - 34.0 pg   MCHC 34.8 30.0 - 36.0 g/dL   RDW 19.1 47.8 - 29.5 %   Platelets 218 150 - 400 K/uL   nRBC 0.0 0.0 - 0.2 %   Neutrophils Relative % 42 %   Neutro Abs 3.1 1.7 - 7.7 K/uL   Lymphocytes Relative 47 %   Lymphs Abs 3.5 0.7 - 4.0 K/uL   Monocytes Relative 10 %   Monocytes Absolute 0.8 0.1 - 1.0 K/uL   Eosinophils Relative 1 %   Eosinophils Absolute 0.0 0.0 - 0.5 K/uL   Basophils Relative 0 %   Basophils Absolute 0.0 0.0 - 0.1 K/uL   Immature Granulocytes 0 %   Abs Immature Granulocytes 0.01 0.00 - 0.07 K/uL    Comment: Performed at [facility], [address], [place], [place] [address]  Magnesium     Status: None   Collection Time: 10/08/22 10:39 PM  Result Value Ref Range   Magnesium 2.2 1.7 - 2.4 mg/dL    Comment: Performed at [facility], [address], [place], [place] [address]  Comprehensive metabolic panel     Status: Abnormal   Collection Time: 10/08/22 10:39 PM  Result Value Ref Range   Sodium 138 135 - 145 mmol/L   Potassium 2.7 (LL) 3.5 - 5.1 mmol/L    Comment: CRITICAL RESULT CALLED TO, READ BACK BY AND VERIFIED WITH BONIS,G AT 2337 ON 10/08/22 BY LUZOLOP    Chloride 110 98 - 111 mmol/L   CO2 21 (L) 22 - 32 mmol/L   Glucose, Bld 89 70 - 99 mg/dL    Comment: Glucose reference range applies only to samples taken after fasting for at least 8 hours.   BUN 17 6 - 20 mg/dL   Creatinine, Ser 4.69 0.44 - 1.00 mg/dL   Calcium  7.8 (L) 8.9 - 10.3 mg/dL   Total Protein 6.2 (L) 6.5 - 8.1 g/dL   Albumin 3.2 (L) 3.5 - 5.0 g/dL   AST 25 15 - 41 U/L   ALT 17 0 - 44 U/L   Alkaline Phosphatase 43 38 - 126 U/L   Total Bilirubin 0.2 (L) 0.3 - 1.2 mg/dL   GFR, Estimated >62 >95 mL/min  Comment: (NOTE) Calculated using the CKD-EPI Creatinine Equation (2021)    Anion gap 7 5 - 15    Comment: Performed at [facility], [address], [place], [place] [address]  Ethanol     Status: Abnormal   Collection Time: 10/08/22 10:39 PM  Result Value Ref Range   Alcohol, Ethyl (B) 14 (H) <10 mg/dL    Comment: (NOTE) Lowest detectable limit for serum alcohol is 10 mg/dL.  For medical purposes only. Performed at [facility], [address] [place], [place] [address]   SARS Coronavirus 2 by RT PCR (hospital order, performed in [facility] hospital lab) *cepheid single result test* Anterior Nasal Swab     Status: None   Collection Time: 10/09/22  2:30 AM   Specimen: Anterior Nasal Swab  Result Value Ref Range   SARS Coronavirus 2 by RT PCR NEGATIVE NEGATIVE    Comment: (NOTE) SARS-CoV-2 target nucleic acids are NOT DETECTED.  The SARS-CoV-2 RNA is generally detectable in upper and lower respiratory specimens during the acute phase of infection. The lowest concentration of SARS-CoV-2 viral copies this assay can detect is 250 copies / mL. A negative result does not preclude SARS-CoV-2 infection and should not be used as the sole basis for treatment or other patient management decisions.  A negative result may occur with improper specimen collection / handling, submission of specimen other than nasopharyngeal swab, presence of viral mutation(s) within the areas targeted by this assay, and inadequate number of viral copies (<250 copies / mL). A negative result must be combined with clinical observations, patient history, and epidemiological information.  Fact Sheet for Patients:    RoadLapTop.co.za  Fact Sheet for Healthcare Providers: http://kim-miller.com/  This test is not yet approved or  cleared by the [place] FDA and has been authorized for detection and/or diagnosis of SARS-CoV-2 by FDA under an Emergency Use Authorization (EUA).  This EUA will remain in effect (meaning this test can be used) for the duration of the COVID-19 declaration under Section 564(b)(1) of the Act, 21 U.S.C. section 360bbb-3(b)(1), unless the authorization is terminated or revoked sooner.  Performed at [facility], [address], [place], [place] [address]   Basic metabolic panel     Status: Abnormal   Collection Time: 10/09/22  4:02 AM  Result Value Ref Range   Sodium 138 135 - 145 mmol/L   Potassium 4.0 3.5 - 5.1 mmol/L   Chloride 107 98 - 111 mmol/L   CO2 24 22 - 32 mmol/L   Glucose, Bld 107 (H) 70 - 99 mg/dL    Comment: Glucose reference range applies only to samples taken after fasting for at least 8 hours.   BUN 16 6 - 20 mg/dL   Creatinine, Ser 2.53 0.44 - 1.00 mg/dL   Calcium 8.2 (L) 8.9 - 10.3 mg/dL   GFR, Estimated >66 >44 mL/min    Comment: (NOTE) Calculated using the CKD-EPI Creatinine Equation (2021)    Anion gap 7 5 - 15    Comment: Performed at [facility], [address], [place], [place] [address]  CBC with Differential/Platelet     Status: Abnormal   Collection Time: 10/09/22  4:02 AM  Result Value Ref Range   WBC 6.7 4.0 - 10.5 K/uL   RBC 3.02 (L) 3.87 - 5.11 MIL/uL   Hemoglobin 9.1 (L) 12.0 - 15.0 g/dL   HCT 25.9 (L) 56.3 - 87.5 %   MCV 84.8 80.0 -  100.0 fL   MCH 30.1 26.0 - 34.0 pg   MCHC 35.5 30.0 - 36.0 g/dL   RDW 16.1 09.6 - 04.5 %   Platelets 229 150 - 400 K/uL   nRBC 0.0 0.0 - 0.2 %   Neutrophils Relative % 54 %   Neutro Abs 3.6 1.7 - 7.7 K/uL   Lymphocytes Relative 37 %   Lymphs Abs 2.5 0.7 - 4.0 K/uL   Monocytes Relative 9 %   Monocytes  Absolute 0.6 0.1 - 1.0 K/uL   Eosinophils Relative 0 %   Eosinophils Absolute 0.0 0.0 - 0.5 K/uL   Basophils Relative 0 %   Basophils Absolute 0.0 0.0 - 0.1 K/uL   Immature Granulocytes 0 %   Abs Immature Granulocytes 0.02 0.00 - 0.07 K/uL    Comment: Performed at [facility], [address], [place], [place] [address]  Vitamin B12     Status: None   Collection Time: 10/09/22  4:02 AM  Result Value Ref Range   Vitamin B-12 311 180 - 914 pg/mL    Comment: (NOTE) This assay is not validated for testing neonatal or myeloproliferative syndrome specimens for Vitamin B12 levels. Performed at [facility], [address] [place], [place] [address]   Folate     Status: Abnormal   Collection Time: 10/09/22  4:02 AM  Result Value Ref Range   Folate 5.4 (L) >5.9 ng/mL    Comment: Performed at [facility], [address] [place], [place] [address]  Iron and TIBC     Status: None   Collection Time: 10/09/22  4:02 AM  Result Value Ref Range   Iron 59 28 - 170 ug/dL   TIBC 213 086 - 578 ug/dL   Saturation Ratios 18 10.4 - 31.8 %   UIBC 276 ug/dL    Comment: Performed at [facility], [address] [place], [place] [address]  Ferritin     Status: None   Collection Time: 10/09/22  4:02 AM  Result Value Ref Range   Ferritin 180 11 - 307 ng/mL    Comment: Performed at [facility], [address], [place], [place] [address]  Reticulocytes     Status: Abnormal   Collection Time: 10/09/22  4:02 AM  Result Value Ref Range   Retic Ct Pct 1.0 0.4 - 3.1 %   RBC. 3.29 (L) 3.87 - 5.11 MIL/uL   Retic Count, Absolute 32.9 19.0 - 186.0 K/uL   Immature Retic Fract 9.5 2.3 - 15.9 %    Comment: Performed at [facility], [address] [place], [place] [address]  ECHOCARDIOGRAM COMPLETE     Status: None   Collection Time: 10/09/22 10:16 AM  Result Value Ref Range   Weight 2,483.26 oz    Height 69 in   BP 107/68 mmHg   Single Plane A2C EF 70.4 %   Single Plane A4C EF 60.6 %   Calc EF 65.4 %   S' Lateral 2.70 cm   Area-P 1/2 3.43 cm2   Est EF 60 - 65%   Rapid urine drug screen (hospital performed)     Status: Abnormal   Collection Time: 10/09/22  6:51 PM  Result Value Ref Range   Opiates NONE DETECTED NONE DETECTED   Cocaine NONE DETECTED NONE DETECTED   Benzodiazepines NONE DETECTED NONE DETECTED   Amphetamines NONE DETECTED NONE DETECTED   Tetrahydrocannabinol POSITIVE (A) NONE DETECTED   Barbiturates NONE  DETECTED NONE DETECTED    Comment: (NOTE) DRUG SCREEN FOR MEDICAL PURPOSES ONLY.  IF CONFIRMATION IS NEEDED FOR ANY PURPOSE, NOTIFY LAB WITHIN 5 DAYS.  LOWEST DETECTABLE LIMITS FOR URINE DRUG SCREEN Drug Class                     Cutoff (ng/mL) Amphetamine and metabolites    1000 Barbiturate and metabolites    200 Benzodiazepine                 200 Opiates and metabolites        300 Cocaine and metabolites        300 THC                            50 Performed at [facility], [address], [place], [place] [address]   CBG monitoring, ED     Status: None   Collection Time: 10/12/22 11:22 AM  Result Value Ref Range   Glucose-Capillary 93 70 - 99 mg/dL    Comment: Glucose reference range applies only to samples taken after fasting for at least 8 hours.  Protime-INR     Status: None   Collection Time: 10/12/22 11:23 AM  Result Value Ref Range   Prothrombin Time 13.4 11.4 - 15.2 seconds   INR 1.0 0.8 - 1.2    Comment: (NOTE) INR goal varies based on device and disease states. Performed at [facility], [address], [place], [place] [address]   APTT     Status: None   Collection Time: 10/12/22 11:23 AM  Result Value Ref Range   aPTT 29 24 - 36 seconds    Comment: Performed at [facility], [address], [place], [place] [address]  CBC     Status: Abnormal   Collection Time: 10/12/22 11:23 AM  Result Value  Ref Range   WBC 7.4 4.0 - 10.5 K/uL   RBC 3.75 (L) 3.87 - 5.11 MIL/uL   Hemoglobin 10.5 (L) 12.0 - 15.0 g/dL   HCT 13.0 (L) 86.5 - 78.4 %   MCV 80.5 80.0 - 100.0 fL   MCH 28.0 26.0 - 34.0 pg   MCHC 34.8 30.0 - 36.0 g/dL   RDW 69.6 29.5 - 28.4 %   Platelets 260 150 - 400 K/uL   nRBC 0.0 0.0 - 0.2 %    Comment: Performed at [facility], [address], [place], [place] [address]  Differential     Status: None   Collection Time: 10/12/22 11:23 AM  Result Value Ref Range   Neutrophils Relative % 46 %   Neutro Abs 3.5 1.7 - 7.7 K/uL   Lymphocytes Relative 46 %   Lymphs Abs 3.4 0.7 - 4.0 K/uL   Monocytes Relative 7 %   Monocytes Absolute 0.5 0.1 - 1.0 K/uL   Eosinophils Relative 0 %   Eosinophils Absolute 0.0 0.0 - 0.5 K/uL   Basophils Relative 1 %   Basophils Absolute 0.0 0.0 - 0.1 K/uL   Immature Granulocytes 0 %   Abs Immature Granulocytes 0.01 0.00 - 0.07 K/uL    Comment: Performed at [facility], [address] [place], [place] [address]  Comprehensive metabolic panel     Status: Abnormal   Collection Time: 10/12/22 11:23 AM  Result Value Ref Range   Sodium 134 (L) 135 - 145 mmol/L   Potassium 3.6 3.5 -  5.1 mmol/L   Chloride 103 98 - 111 mmol/L   CO2 21 (L) 22 - 32 mmol/L   Glucose, Bld 98 70 - 99 mg/dL    Comment: Glucose reference range applies only to samples taken after fasting for at least 8 hours.   BUN 12 8 - 23 mg/dL   Creatinine, Ser 1.61 (H) 0.44 - 1.00 mg/dL   Calcium 8.6 (L) 8.9 - 10.3 mg/dL   Total Protein 7.0 6.5 - 8.1 g/dL   Albumin 3.6 3.5 - 5.0 g/dL   AST 23 15 - 41 U/L   ALT 16 0 - 44 U/L   Alkaline Phosphatase 54 38 - 126 U/L   Total Bilirubin 0.7 0.3 - 1.2 mg/dL   GFR, Estimated >09 >60 mL/min    Comment: (NOTE) Calculated using the CKD-EPI Creatinine Equation (2021)    Anion gap 10 5 - 15    Comment: Performed at [facility], [address], [place], [place] [address]  Ethanol     Status: None   Collection Time: 10/12/22  11:23 AM  Result Value Ref Range   Alcohol, Ethyl (B) <10 <10 mg/dL    Comment: (NOTE) Lowest detectable limit for serum alcohol is 10 mg/dL.  For medical purposes only. Performed at [facility], [address], [place], [place] [address]   I-stat chem 8, ED     Status: Abnormal   Collection Time: 10/12/22 11:26 AM  Result Value Ref Range   Sodium 137 135 - 145 mmol/L   Potassium 3.8 3.5 - 5.1 mmol/L   Chloride 104 98 - 111 mmol/L   BUN 14 8 - 23 mg/dL   Creatinine, Ser 4.78 0.44 - 1.00 mg/dL   Glucose, Bld 96 70 - 99 mg/dL    Comment: Glucose reference range applies only to samples taken after fasting for at least 8 hours.   Calcium, Ion 1.11 (L) 1.15 - 1.40 mmol/L   TCO2 24 22 - 32 mmol/L   Hemoglobin 10.9 (L) 12.0 - 15.0 g/dL   HCT 29.5 (L) 62.1 - 30.8 %        Garner Nash, MD, MS

## 2022-10-21 NOTE — Progress Notes (Addendum)
NEUROLOGY CONSULTATION NOTE  Barbara [name] MRN: [id number] DOB: Oct 08, 1960  Referring provider: Fanny Bien, MD Primary care provider: Fanny Bien, MD  Reason for consult:  right sided numbness and weakness  Assessment/Plan:   Right sided numbness Right sided weakness Raynaud's phenomenon Migraine with aura  She exhibits some mild right upper and lower extremity weakness as well as hyperesthesia on the right side, including torso.  Unclear etiology.  Since it is lateralized, would consider spinal cord etiology (as MRI of brain negative) but she does not exhibit any objective symptoms of myelopathy.  Given the Raynaud's, would want to evaluate further for a neuropathy.  Would also consider vascular etiology as well (such as subclavian steal syndrome) but unlikely given negative carotid ultrasound and wouldn't explain lower extremity symptoms.  NCV-EMG right upper and lower extremities Further recommendations pending results.   Subjective:  Barbara [name] is a 62 year old female with Sickle cell trait, HTN, iron-deficiency anemia, and arthritis who presents for right-sided numbness and weakness.  History supplemented by ED and primary care notes.  CT, MRI and MRA of head personally reviewed.  She reports numbness and tingling for 2 months.  It occurs in both hands and sometimes her feet, but usually just right-sided.  She feels it daily but often occurs after getting wet.  She notes whiteness of her fingers of her right hand after the tingling stops.  She does have left sided sciatica.  B12 level on 5/19 was 311.  Labs from February revealed negative ANA, sed rate 21, negative CRP and TSH 2.91.    On 5/22, she was in the shower when suddenly she developed tingling in her right foot that progressed up her right leg and to her right hand.  Also noted that her right leg felt a little heavy as well but not her arm.  She then developed a right sided 6/10 piercing headache.  History of  migraines but this was different. She did have nausea but no photophobia and phonophobia.  When EMS arrived, she was noted to be speaking slowly and had difficulty getting words out.  She was seen at [facility] ED.  CT head and follow up MRI and MRA of brain revealed mild chronic small vessel ischemic changes but no acute stroke or LVO or significant stenosis.  She was treated with a headache cocktail for presumed complicated migraine.    Yesterday, she had another headache but was pressure-like beginning in the back or head that radiated down neck and into the shoulders and up to the front and temples bilaterally  She had just been discharged on 5/18 from [facility] for syncope of unknown etiology.  Orthostatic vitals were negative.  2D echo showed EF 50% with no wall motion abnormalities.  Carotid Doppler revealed no hemodynamically significant stenosis.  Telemetry was unremarkable.  Labs revealed baseline anemia.  K+ was noted to be 2.7, which was repleted.  She was discharged with outpatient cardiology follow up.  Since then, she had continued to feel lightheaded.  She was set up with outpatient cardiac event monitor.       PAST MEDICAL HISTORY: Past Medical History:  Diagnosis Date   Allergy 1965   Nuts, trees, grass, bees, red ants   Anemia 1980   Arthritis 2007   GERD (gastroesophageal reflux disease)    Hypertension    Low iron    Sickle cell trait (HCC)    Ulcer 1981   Viral gastroenteritis 07/24/2022    PAST  SURGICAL HISTORY: Past Surgical History:  Procedure Laterality Date   ANTERIOR CRUCIATE LIGAMENT REPAIR     CHOLECYSTECTOMY     ENDOMETRIAL ABLATION     LEFT HEART CATHETERIZATION WITH CORONARY ANGIOGRAM N/A 08/27/2014   Procedure: LEFT HEART CATHETERIZATION WITH CORONARY ANGIOGRAM;  Surgeon: Iran Ouch, MD;  Location: [facility] CATH LAB;  Service: Cardiovascular;  Laterality: N/A;   TUBAL LIGATION  1980    MEDICATIONS: Current Outpatient Medications on File  Prior to Visit  Medication Sig Dispense Refill   amLODipine (NORVASC) 5 MG tablet Take 1 tablet (5 mg total) by mouth daily. 90 tablet 0   cyclobenzaprine (FLEXERIL) 5 MG tablet TAKE 1-2 TABLETS (5-10 MG TOTAL) BY MOUTH AT BEDTIME. (Patient taking differently: Take 5-10 mg by mouth at bedtime as needed for muscle spasms.) 30 tablet 1   diclofenac Sodium (VOLTAREN) 1 % GEL APPLY 4 G TOPICALLY 4 (FOUR) TIMES DAILY AS NEEDED. (Patient taking differently: Apply 4 g topically 4 (four) times daily as needed (pain).) 100 g 3   dicyclomine (BENTYL) 20 MG tablet Take 1 tablet (20 mg total) by mouth in the morning and at bedtime. 20 tablet 0   famotidine (PEPCID) 40 MG tablet Take 1 tablet (40 mg total) by mouth daily. 90 tablet 1   hydrochlorothiazide (HYDRODIURIL) 25 MG tablet Take 1 tablet (25 mg total) by mouth daily. 90 tablet 3   lisinopril (ZESTRIL) 10 MG tablet Take 1 tablet (10 mg total) by mouth daily. 180 tablet 1   montelukast (SINGULAIR) 10 MG tablet Take 1 tablet (10 mg total) by mouth at bedtime. 90 tablet 0   pantoprazole (PROTONIX) 40 MG tablet Take 40 mg by mouth daily.     rosuvastatin (CRESTOR) 10 MG tablet Take 1 tablet (10 mg total) by mouth daily. 90 tablet 3   No current facility-administered medications on file prior to visit.    ALLERGIES: Allergies  Allergen Reactions   Iodinated Contrast Media Anaphylaxis    States he face swells when she has CT iodinated contrast   Iodine Swelling    Other reaction(s): Facial swelling   Morphine And Codeine Itching   Other Swelling    All Nut Allergies   Sulfa Antibiotics Swelling   Sulfamethoxazole-Trimethoprim Other (See Comments)    FAMILY HISTORY: Family History  Problem Relation Age of Onset   CAD Brother 13       CABG at age 29, and deceased   Heart disease Brother    Hypertension Brother    CAD Sister 84       1 stent   CAD Father 9       Deceased in 37s   Heart disease Father    Hypertension Father    Cancer  Mother    Heart disease Brother    Hypertension Brother    Hypertension Daughter    Hypertension Maternal Aunt    Stroke Maternal Aunt    Hypertension Paternal Uncle     Objective:  Blood pressure 120/70, pulse 75, resp. rate 18, height 5\' 9"  (1.753 m), SpO2 98 %. General: No acute distress.  Patient appears well-groomed.   Head:  Normocephalic/atraumatic Eyes:  fundi examined but not visualized Neck: supple, no paraspinal tenderness, full range of motion Back: No paraspinal tenderness Heart: regular rate and rhythm Lungs: Clear to auscultation bilaterally. Vascular: No carotid bruits. Neurological Exam: Mental status: alert and oriented to person, place, and time, speech fluent and not dysarthric, language intact. Cranial nerves: CN I: not tested  CN II: pupils equal, round and reactive to light, visual fields intact CN III, IV, VI:  full range of motion, no nystagmus, no ptosis CN V: facial sensation intact. CN VII: upper and lower face symmetric CN VIII: hearing intact CN IX, X: gag intact, uvula midline CN XI: sternocleidomastoid and trapezius muscles intact CN XII: tongue midline Bulk & Tone: normal, no fasciculations. Motor:  muscle strength 5- right upper extremity, right ankle dorsiflexion and EHL, otherwise 5/5 throughout Sensation:  Pinprick sensation hyperesthesia right upper and lower extremities, vibratory sensation intact. Deep Tendon Reflexes:  2+ throughout,  toes downgoing.   Finger to nose testing:  Without dysmetria.   Heel to shin:  Without dysmetria.   Gait:  Normal station and stride.  Romberg negative.    Thank you for allowing me to take part in the care of this patient.  Shon Millet, DO  CC: Fanny Bien, MD

## 2022-10-24 ENCOUNTER — Encounter: Payer: Self-pay | Admitting: Neurology

## 2022-10-24 ENCOUNTER — Ambulatory Visit (INDEPENDENT_AMBULATORY_CARE_PROVIDER_SITE_OTHER): Payer: 59 | Admitting: Neurology

## 2022-10-24 VITALS — BP 120/70 | HR 75 | Resp 18 | Ht 69.0 in

## 2022-10-24 DIAGNOSIS — R200 Anesthesia of skin: Secondary | ICD-10-CM | POA: Diagnosis not present

## 2022-10-24 DIAGNOSIS — R531 Weakness: Secondary | ICD-10-CM | POA: Diagnosis not present

## 2022-10-24 DIAGNOSIS — G43001 Migraine without aura, not intractable, with status migrainosus: Secondary | ICD-10-CM | POA: Diagnosis not present

## 2022-10-24 DIAGNOSIS — I7300 Raynaud's syndrome without gangrene: Secondary | ICD-10-CM | POA: Diagnosis not present

## 2022-10-24 NOTE — Patient Instructions (Signed)
We will start with nerve study of right arm and leg  Further recommendations pending results.

## 2022-10-28 ENCOUNTER — Other Ambulatory Visit: Payer: Self-pay | Admitting: Family Medicine

## 2022-10-28 DIAGNOSIS — M545 Low back pain, unspecified: Secondary | ICD-10-CM

## 2022-10-28 DIAGNOSIS — I10 Essential (primary) hypertension: Secondary | ICD-10-CM

## 2022-10-31 ENCOUNTER — Encounter: Payer: Self-pay | Admitting: Obstetrics and Gynecology

## 2022-10-31 ENCOUNTER — Ambulatory Visit (INDEPENDENT_AMBULATORY_CARE_PROVIDER_SITE_OTHER): Payer: 59 | Admitting: Obstetrics and Gynecology

## 2022-10-31 VITALS — BP 105/61 | HR 80 | Wt 155.0 lb

## 2022-10-31 DIAGNOSIS — N939 Abnormal uterine and vaginal bleeding, unspecified: Secondary | ICD-10-CM | POA: Diagnosis not present

## 2022-10-31 NOTE — Progress Notes (Signed)
NEW GYNECOLOGY PATIENT Patient name: Barbara [name] MRN [id number]  Date of birth: [birth year] Chief Complaint:   postmenopausal bleeding     History:  Barbara [name] is a 62 y.o. [id number] being seen today for PMB.  Postmenopausal bleeding on and off for a while. Most recent bleeding was heavier than most; typically it would be consistently spotting once a quarter Since January she had bleeding every month with diarrhea similar to when she used to have menses 2 years ago had a D&C Prior to D&c had at least 5 years without bleeding and after D&C bleeding was lighter Does not recall any additional treatment after hysteroscopy Ultimately would like a hysterectomy Reports prior EMB and had to stop due to having significant difficulty tolerating procedure and provider having difficulty getting into uterus, reports very tilted uterus   Had mild cramping and diarrhea  Diagnosed with an arrythmia recently, after she fainted Weight loss of 30# since February, gained maybe 10# back Baseline has a lot of stomach issues so no new early satiety    Got sick around her birthday and had cramping in legs and subsequent dyncopal espidoes, went to ED recxieved lduids and workup,m noted ot have new arrythmia and wore  moniotir for a few days, has follow up next week with cardiology   Urine urgenmcy - when she feels the urge to void she has to go immediately or she may have an accident No recent UTI Not sexually active Small urine volume loss with sneeze/laugh/cough      Gynecologic History Patient's last menstrual period was 10/08/2021. Contraception: abstinence Last Pap: 06/17/2020 NILM, other HR HPV positive Last Mammogram: 01/2022 BIRADS 1 Last Colonoscopy:  Obstetric History OB History  Gravida Para Term Preterm AB Living  4 2   2 2 2   SAB IAB Ectopic Multiple Live Births  2       2    # Outcome Date GA Lbr Len/2nd Weight Sex Delivery Anes PTL Lv  4 SAB 2002          3 Preterm 1990 [redacted]w[redacted]d   F  Vag-Spont None N LIV  2 Preterm 1989 [redacted]w[redacted]d   F Vag-Spont None N LIV  1 SAB 1987            Past Medical History:  Diagnosis Date   Allergy 1965   Nuts, trees, grass, bees, red ants   Anemia 1980   Arthritis 2007   GERD (gastroesophageal reflux disease)    Hypertension    Low iron    Sickle cell trait (HCC)    Ulcer 1981   Viral gastroenteritis 07/24/2022    Past Surgical History:  Procedure Laterality Date   ANTERIOR CRUCIATE LIGAMENT REPAIR     CHOLECYSTECTOMY     ENDOMETRIAL ABLATION     LEFT HEART CATHETERIZATION WITH CORONARY ANGIOGRAM N/A 08/27/2014   Procedure: LEFT HEART CATHETERIZATION WITH CORONARY ANGIOGRAM;  Surgeon: Iran Ouch, MD;  Location: [facility];  Service: Cardiovascular;  Laterality: N/A;   TUBAL LIGATION  1980    Current Outpatient Medications on File Prior to Visit  Medication Sig Dispense Refill   amLODipine (NORVASC) 5 MG tablet Take 1 tablet (5 mg total) by mouth daily. 90 tablet 0   cyclobenzaprine (FLEXERIL) 5 MG tablet TAKE 1-2 TABLETS (5-10 MG TOTAL) BY MOUTH AT BEDTIME. 30 tablet 1   diclofenac Sodium (VOLTAREN) 1 % GEL APPLY 4 G TOPICALLY 4 (FOUR) TIMES DAILY AS NEEDED. 100 g 3   dicyclomine (BENTYL)  20 MG tablet Take 1 tablet (20 mg total) by mouth in the morning and at bedtime. 20 tablet 0   famotidine (PEPCID) 40 MG tablet Take 1 tablet (40 mg total) by mouth daily. 90 tablet 1   hydrochlorothiazide (HYDRODIURIL) 25 MG tablet Take 1 tablet (25 mg total) by mouth daily. 90 tablet 3   lisinopril (ZESTRIL) 10 MG tablet Take 1 tablet (10 mg total) by mouth daily. 180 tablet 1   montelukast (SINGULAIR) 10 MG tablet Take 1 tablet (10 mg total) by mouth at bedtime. 90 tablet 0   pantoprazole (PROTONIX) 40 MG tablet Take 40 mg by mouth daily.     rosuvastatin (CRESTOR) 10 MG tablet Take 1 tablet (10 mg total) by mouth daily. 90 tablet 3   No current facility-administered medications on file prior to visit.    Allergies  Allergen  Reactions   Iodinated Contrast Media Anaphylaxis    States he face swells when she has CT iodinated contrast   Iodine Swelling    Other reaction(s): Facial swelling   Morphine And Codeine Itching   Other Swelling    All Nut Allergies   Sulfa Antibiotics Swelling   Sulfamethoxazole-Trimethoprim Other (See Comments)    Social History:  reports that she quit smoking about 9 years ago. Her smoking use included cigarettes. She has never used smokeless tobacco. She reports current alcohol use. She reports that she does not use drugs.  Family History  Problem Relation Age of Onset   CAD Brother 40       CABG at age 55, and deceased   Heart disease Brother    Hypertension Brother    CAD Sister 22       1 stent   CAD Father 45       Deceased in 59s   Heart disease Father    Hypertension Father    Cancer Mother    Heart disease Brother    Hypertension Brother    Hypertension Daughter    Hypertension Maternal Aunt    Stroke Maternal Aunt    Hypertension Paternal Uncle     The following portions of the patient's history were reviewed and updated as appropriate: allergies, current medications, past family history, past medical history, past social history, past surgical history and problem list.  Review of Systems Pertinent items noted in HPI and remainder of comprehensive ROS otherwise negative.  Physical Exam:  BP 105/61   Pulse 80   Wt 155 lb (70.3 kg)   LMP 10/08/2021   BMI 22.89 kg/m  Physical Exam Vitals and nursing note reviewed.  Constitutional:      Appearance: Normal appearance.  Cardiovascular:     Rate and Rhythm: Normal rate.  Pulmonary:     Effort: Pulmonary effort is normal.     Breath sounds: Normal breath sounds.  Neurological:     General: No focal deficit present.     Mental Status: She is alert and oriented to person, place, and time.  Psychiatric:        Mood and Affect: Mood normal.        Behavior: Behavior normal.        Thought Content:  Thought content normal.        Judgment: Judgment normal.        Assessment and Plan:   1. Abnormal uterine bleeding (AUB) Pelvic US ordered to assess stripe. Recommend endometrial sampling. If there appears to be a specific lesion, will proceed with pap and preop for  HSC whereas if it appears generally thickened, will proceed with EMB in office with pre-procedure misoprostol to soften cervix. Suspect retroflexed uterus with cervical stenosis. Labs today to fully assess menopausal status given hx of endometrial ablation followed by bleeding on and off for the last few years.  - US PELVIC COMPLETE WITH TRANSVAGINAL; Future - Estradiol - FSH   Routine preventative health maintenance measures emphasized. Please refer to After Visit Summary for other counseling recommendations.   Follow-up: Return for GYN Follow Up.     Lorriane Shire, MD Obstetrician & Gynecologist, [facility] [employer], [facility]

## 2022-10-31 NOTE — Telephone Encounter (Signed)
Chart supports rx. Last OV: 10/19/2022 Next OV: 12/08/2022

## 2022-11-01 ENCOUNTER — Ambulatory Visit (HOSPITAL_BASED_OUTPATIENT_CLINIC_OR_DEPARTMENT_OTHER)
Admission: RE | Admit: 2022-11-01 | Discharge: 2022-11-01 | Disposition: A | Discharge status: Home / Self Care | Payer: 59 | Source: Ambulatory Visit | Attending: Obstetrics and Gynecology | Admitting: Obstetrics and Gynecology

## 2022-11-01 DIAGNOSIS — N939 Abnormal uterine and vaginal bleeding, unspecified: Secondary | ICD-10-CM | POA: Insufficient documentation

## 2022-11-01 LAB — FOLLICLE STIMULATING HORMONE: FSH: 100 m[IU]/mL (ref 25.8–134.8)

## 2022-11-01 LAB — ESTRADIOL: Estradiol: <5 pg/mL (ref 0.0–54.7)

## 2022-11-02 ENCOUNTER — Other Ambulatory Visit: Payer: Self-pay | Admitting: Obstetrics and Gynecology

## 2022-11-02 ENCOUNTER — Other Ambulatory Visit: Payer: Self-pay | Admitting: Family Medicine

## 2022-11-02 DIAGNOSIS — R197 Diarrhea, unspecified: Secondary | ICD-10-CM

## 2022-11-02 DIAGNOSIS — Z780 Asymptomatic menopausal state: Secondary | ICD-10-CM

## 2022-11-02 MED ORDER — MISOPROSTOL 200 MCG PO TABS
ORAL_TABLET | ORAL | 0 refills | Status: DC
Start: 2022-11-02 — End: 2022-12-08

## 2022-11-03 ENCOUNTER — Other Ambulatory Visit: Payer: Self-pay | Admitting: Family Medicine

## 2022-11-03 DIAGNOSIS — I10 Essential (primary) hypertension: Secondary | ICD-10-CM

## 2022-11-03 MED ORDER — DICYCLOMINE HCL 20 MG PO TABS
20.0000 mg | ORAL_TABLET | Freq: Two times a day (BID) | ORAL | 0 refills | Status: DC
Start: 2022-11-03 — End: 2023-01-09

## 2022-11-03 NOTE — Telephone Encounter (Signed)
Chart supports rx. Last OV: [id number] Next OV: [id number]

## 2022-11-04 ENCOUNTER — Ambulatory Visit (INDEPENDENT_AMBULATORY_CARE_PROVIDER_SITE_OTHER): Payer: 59 | Admitting: Neurology

## 2022-11-04 DIAGNOSIS — R531 Weakness: Secondary | ICD-10-CM

## 2022-11-04 DIAGNOSIS — R200 Anesthesia of skin: Secondary | ICD-10-CM | POA: Diagnosis not present

## 2022-11-04 DIAGNOSIS — G43001 Migraine without aura, not intractable, with status migrainosus: Secondary | ICD-10-CM

## 2022-11-04 DIAGNOSIS — I7300 Raynaud's syndrome without gangrene: Secondary | ICD-10-CM

## 2022-11-04 NOTE — Procedures (Signed)
[facility]  [address] [place], [address]  [place], [place] [address] Tel: [phone] Fax: [phone] Test Date:  11/04/2022  Patient: Barbara [name] DOB: December 19, 1960 Physician: Nita Sickle, DO  Sex: Female Height: 5\' 9"  Ref Phys: Shon Millet, DO  ID#: [id number]   Technician:    History: This is a 62 year old female referred for evaluation of paresthesias involving the right arm and leg.  NCV & EMG Findings: Extensive electrodiagnostic testing of the right upper and lower extremity shows:  All sensory responses including the right median, ulnar, mixed palmar, sural, and superficial peroneal nerves are within normal limits. All motor responses including the right median, ulnar, peroneal, and tibial nerves are within normal limits. There is no evidence of active or chronic motor axonal loss changes affecting any of the tested muscles.  Motor unit configuration and recruitment pattern is within normal limits.  Impression: This is a normal study of the right upper and lower extremities.  In particular, there is no evidence of a large fiber sensorimotor polyneuropathy, cervical/lumbosacral radiculopathy, or carpal tunnel syndrome.   ___________________________ Nita Sickle, DO    Nerve Conduction Studies   Stim Site NR Peak (ms) Norm Peak (ms) O-P Amp (V) Norm O-P Amp  Right Median Anti Sensory (2nd Digit)  32 C  Wrist    3.1 <3.8 40.4 >10  Right Sup Peroneal Anti Sensory (Ant Lat Mall)  32 C  12 cm    2.5 <4.6 14.3 >3  Right Sural Anti Sensory (Lat Mall)  32 C  Calf    2.5 <4.6 10.0 >3  Right Ulnar Anti Sensory (5th Digit)  32 C  Wrist    2.7 <3.2 42.9 >5     Stim Site NR Onset (ms) Norm Onset (ms) O-P Amp (mV) Norm O-P Amp Site1 Site2 Delta-0 (ms) Dist (cm) Vel (m/s) Norm Vel (m/s)  Right Median Motor (Abd Poll Brev)  32 C  Wrist    2.7 <4.0 15.1 >5 Elbow Wrist 5.0 30.0 60 >50  Elbow    7.7  14.4         Right Peroneal Motor (Ext Dig Brev)  32 C  Ankle     3.2 <6.0 2.6 >2.5 B Fib Ankle 6.9 40.0 58 >40  B Fib    10.1  2.1  Poplt B Fib 1.7 8.0 47 >40  Poplt    11.8  1.9         Right Tibial Motor (Abd Hall Brev)  32 C  Ankle    4.1 <6.0 4.1 >4 Knee Ankle 7.2 45.0 62 >40  Knee    11.3  3.3         Right Ulnar Motor (Abd Dig Minimi)  32 C  Wrist    1.9 <3.1 10.7 >7 B Elbow Wrist 4.4 23.0 52 >50  B Elbow    6.3  10.1  A Elbow B Elbow 1.7 10.0 59 >50  A Elbow    8.0  9.5            Stim Site NR Peak (ms) Norm Peak (ms) P-T Amp (V) Site1 Site2 Delta-P (ms) Norm Delta (ms)  Right Median/Ulnar Palm Comparison (Wrist - 8cm)  32 C  Median Palm    1.7 <2.2 61.5 Median Palm Ulnar Palm 0.2   Ulnar Palm    1.5 <2.2 16.4       Electromyography   Side Muscle Ins.Act Fibs Fasc Recrt Amp Dur Poly Activation Comment  Right 1stDorInt Nml  Nml Nml Nml Nml Nml Nml Nml N/A  Right Abd Poll Brev Nml Nml Nml Nml Nml Nml Nml Nml N/A  Right PronatorTeres Nml Nml Nml Nml Nml Nml Nml Nml N/A  Right Biceps Nml Nml Nml Nml Nml Nml Nml Nml N/A  Right Triceps Nml Nml Nml Nml Nml Nml Nml Nml N/A  Right Deltoid Nml Nml Nml Nml Nml Nml Nml Nml N/A  Right AntTibialis Nml Nml Nml Nml Nml Nml Nml Nml N/A  Right Gastroc Nml Nml Nml Nml Nml Nml Nml Nml N/A  Right Flex Dig Long Nml Nml Nml Nml Nml Nml Nml Nml N/A  Right BicepsFemS Nml Nml Nml Nml Nml Nml Nml Nml N/A  Right GluteusMed Nml Nml Nml Nml Nml Nml Nml Nml N/A      Waveforms:

## 2022-11-10 ENCOUNTER — Other Ambulatory Visit: Payer: Self-pay

## 2022-11-10 DIAGNOSIS — R531 Weakness: Secondary | ICD-10-CM

## 2022-11-10 DIAGNOSIS — R200 Anesthesia of skin: Secondary | ICD-10-CM

## 2022-11-15 ENCOUNTER — Encounter: Payer: Self-pay | Admitting: Nurse Practitioner

## 2022-11-15 ENCOUNTER — Ambulatory Visit: Payer: 59 | Attending: Nurse Practitioner | Admitting: Nurse Practitioner

## 2022-11-15 DIAGNOSIS — I4729 Other ventricular tachycardia: Secondary | ICD-10-CM

## 2022-11-15 DIAGNOSIS — Z8249 Family history of ischemic heart disease and other diseases of the circulatory system: Secondary | ICD-10-CM

## 2022-11-15 DIAGNOSIS — I471 Supraventricular tachycardia, unspecified: Secondary | ICD-10-CM | POA: Diagnosis not present

## 2022-11-15 DIAGNOSIS — I441 Atrioventricular block, second degree: Secondary | ICD-10-CM

## 2022-11-15 DIAGNOSIS — R072 Precordial pain: Secondary | ICD-10-CM

## 2022-11-15 DIAGNOSIS — I10 Essential (primary) hypertension: Secondary | ICD-10-CM

## 2022-11-15 DIAGNOSIS — Z87898 Personal history of other specified conditions: Secondary | ICD-10-CM | POA: Diagnosis not present

## 2022-11-15 NOTE — Addendum Note (Signed)
Addended by: Bernadene Person C on: 11/15/2022 11:11 AM   Modules accepted: Orders

## 2022-11-15 NOTE — Progress Notes (Addendum)
Office Visit    Patient Name: Barbara [name] Date of Encounter: 11/15/2022  Primary Care Provider:  Garnette Gunner, MD Primary Cardiologist:  Maisie Fus, MD  Chief Complaint    62 year old female with a history of hypertension, family history of premature CAD, syncope, IDA, sickle cell trait, and GERD who presents for follow-up related to syncope.  Past Medical History    Past Medical History:  Diagnosis Date   Allergy 1965   Nuts, trees, grass, bees, red ants   Anemia 1980   Arthritis 2007   GERD (gastroesophageal reflux disease)    Hypertension    Low iron    Sickle cell trait (HCC)    Ulcer 1981   Viral gastroenteritis 07/24/2022   Past Surgical History:  Procedure Laterality Date   ANTERIOR CRUCIATE LIGAMENT REPAIR     CHOLECYSTECTOMY     ENDOMETRIAL ABLATION     LEFT HEART CATHETERIZATION WITH CORONARY ANGIOGRAM N/A 08/27/2014   Procedure: LEFT HEART CATHETERIZATION WITH CORONARY ANGIOGRAM;  Surgeon: Iran Ouch, MD;  Location: [facility] CATH LAB;  Service: Cardiovascular;  Laterality: N/A;   TUBAL LIGATION  1980    Allergies  Allergies  Allergen Reactions   Iodinated Contrast Media Anaphylaxis    States he face swells when she has CT iodinated contrast   Iodine Swelling    Other reaction(s): Facial swelling   Morphine And Codeine Itching   Other Swelling    All Nut Allergies   Sulfa Antibiotics Swelling   Sulfamethoxazole-Trimethoprim Other (See Comments)     Labs/Other Studies Reviewed    The following studies were reviewed today:  Cardiac Studies & Procedures     STRESS TESTS  ECHOCARDIOGRAM STRESS TEST 11/21/2017  Narrative *[facility]* [address] [place], [place] [address] [phone]  ------------------------------------------------------------------- Stress Echocardiography  Patient:    Barbara [name] MR #:       [id number] Study Date: 11/21/2017 Gender:     F Age:        57 Height:     175.3 cm Weight:      78.9 kg BSA:        1.97 m^2 Pt. Status: Room:  ATTENDING    Belva Crome, MD ORDERING     Belva Crome, MD REFERRING    Belva Crome, MD PERFORMING   [facility] SONOGRAPHER  Jimmy Reel, RDCS  cc:  -------------------------------------------------------------------  ------------------------------------------------------------------- Indications:      Chest pain 786.51.  ------------------------------------------------------------------- History:   PMH:  No prior cardiac history.  ------------------------------------------------------------------- Study Conclusions  - Stress ECG conclusions: There were no stress arrhythmias or conduction abnormalities. The stress ECG was negative for ischemia. - Staged echo: There was no echocardiographic evidence for stress-induced ischemia.  Impressions:  - 1. Stress echo negative for evidence of inducible ischemia. Normal left ventricular systolic function at rest and good augmentation of the left ventricular segments at stress. 2. Good exercise capacity and blood pressure response was normal.  ------------------------------------------------------------------- Study data:   Study status:  Routine.  Consent:  The risks, benefits, and alternatives to the procedure were explained to the patient and informed consent was obtained.  Procedure:  The patient reported no pain pre or post test. Initial setup. The patient was brought to the laboratory. A baseline ECG was recorded. Surface ECG leads and automatic cuff blood pressure measurements were monitored. Treadmill exercise testing was performed using the Bruce protocol. The patient exercised for 6 min 1 sec, to protocol stage  3, to a maximal work rate of 7 mets. Exercise was terminated due to achievement of target heart rate. The patient was positioned for image acquisition and recovery monitoring. Transthoracic stress echocardiography for chest pain evaluation.  Image quality was good. Images were captured at baseline and peak exercise.  Study completion:  The patient tolerated the procedure well. There were no complications.          Bruce protocol. Stress echocardiography. Birthdate:  Patient birthdate: [birth year].  Age:  Patient is 62 yr old.  Sex:  Gender: female.    BMI: 25.7 kg/m^2.  Blood pressure: 122/72  Patient status:  Outpatient.  Study date:  Study date: 11/21/2017. Study time: 08:30 AM.  -------------------------------------------------------------------  ------------------------------------------------------------------- Baseline ECG:  Normal.  ------------------------------------------------------------------- Stress protocol:  +--------+---------------+---+------------+--------+ !Stage   !Time into phase!HR !BP (mmHg)   !Symptoms! +--------+---------------+---+------------+--------+ !Baseline!---------------!69 !149/83 (105)!None    ! +--------+---------------+---+------------+--------+ !Stage 1 !---------------!120!------------!--------! +--------+---------------+---+------------+--------+ !Stage 2 !---------------!157!189/98 (128)!--------! +--------+---------------+---+------------+--------+ !Stage 3 !---------------!157!------------!--------! +--------+---------------+---+------------+--------+ !Recovery!3 min          !74 !142/87 (105)!--------! +--------+---------------+---+------------+--------+  ------------------------------------------------------------------- Stress results:   Maximal heart rate during stress was 157 bpm (96% of maximal predicted heart rate). The maximal predicted heart rate was 164 bpm.The target heart rate was achieved. The heart rate response to stress was normal. There was a normal resting blood pressure with an appropriate response to stress.  The patient experienced no chest pain during stress.  ------------------------------------------------------------------- Stress ECG:  There  were no stress arrhythmias or conduction abnormalities.  The stress ECG was negative for ischemia.  ------------------------------------------------------------------- Baseline:  - The estimated LV ejection fraction was 60%. - Normal wall motion; no LV regional wall motion abnormalities.  Peak stress:  - The estimated LV ejection fraction was 75%. - Normal wall motion; no LV regional wall motion abnormalities.  ------------------------------------------------------------------- Stress echo results:     Left ventricular ejection fraction was normal at rest and with stress. There was no echocardiographic evidence for stress-induced ischemia.  ------------------------------------------------------------------- Measurements  Left ventricle                         Value        Reference LV ID, ED, PLAX chordal        (L)     42.1  mm     43 - 52 LV ID, ES, PLAX chordal                31.2  mm     23 - 38 LV fx shortening, PLAX chordal (L)     26    %      >=29 LV PW thickness, ED                    10.7  mm     --------- IVS/LV PW ratio, ED                    1.02         <=1.3  Ventricular septum                     Value        Reference IVS thickness, ED                      10.9  mm     ---------  LVOT  Value        Reference LVOT ID, S                             18    mm     --------- LVOT area                              2.54  cm^2   ---------  Aorta                                  Value        Reference Aortic root ID, ED                     32    mm     ---------  Left atrium                            Value        Reference LA ID, A-P, ES                         37    mm     --------- LA ID/bsa, A-P                         1.88  cm/m^2 <=2.2  Tricuspid valve                        Value        Reference Tricuspid regurg peak velocity         267   cm/s   --------- Tricuspid peak RV-RA gradient          29    mm Hg   ---------  Legend: (L)  and  (H)  mark values outside specified reference range.  ------------------------------------------------------------------- Prepared and Electronically Authenticated by  Belva Crome, MD 2019-07-02T09:53:51   ECHOCARDIOGRAM  ECHOCARDIOGRAM COMPLETE 10/09/2022  Narrative ECHOCARDIOGRAM REPORT    Patient Name:   STEPANIE GRAVER Date of Exam: 10/09/2022 Medical Rec #:  [id number]     Height:       69.0 in Accession #:    [id number]    Weight:       155.2 lb Date of Birth:  18-Sep-1960     BSA:          1.855 m Patient Age:    61 years      BP:           107/68 mmHg Patient Gender: F             HR:           77 bpm. Exam Location:  Inpatient  Procedure: 2D Echo, Cardiac Doppler and Color Doppler  Indications:    syncope  History:        Patient has no prior history of Echocardiogram examinations. Risk Factors:Hypertension.  Sonographer:    Mike Gip Referring Phys: [phone] DEBBY CROSLEY  IMPRESSIONS   1. Left ventricular ejection fraction, by estimation, is 60 to 65%. The left ventricle has normal function. The left ventricle has no regional wall motion abnormalities. Left ventricular diastolic parameters were normal. 2. Right ventricular systolic function  is normal. The right ventricular size is normal. There is normal pulmonary artery systolic pressure. 3. The mitral valve is normal in structure. No evidence of mitral valve regurgitation. 4. The aortic valve is normal in structure. Aortic valve regurgitation is not visualized. 5. The inferior vena cava is normal in size with greater than 50% respiratory variability, suggesting right atrial pressure of 3 mmHg.  Conclusion(s)/Recommendation(s): Normal biventricular function without evidence of hemodynamically significant valvular heart disease.  FINDINGS Left Ventricle: Left ventricular ejection fraction, by estimation, is 60 to 65%. The left ventricle has normal function. The left ventricle has  no regional wall motion abnormalities. The left ventricular internal cavity size was normal in size. There is no left ventricular hypertrophy. Left ventricular diastolic parameters were normal.  Right Ventricle: The right ventricular size is normal. Right ventricular systolic function is normal. There is normal pulmonary artery systolic pressure. The tricuspid regurgitant velocity is 2.71 m/s, and with an assumed right atrial pressure of 3 mmHg, the estimated right ventricular systolic pressure is 32.4 mmHg.  Left Atrium: Left atrial size was normal in size.  Right Atrium: Right atrial size was normal in size.  Pericardium: There is no evidence of pericardial effusion.  Mitral Valve: The mitral valve is normal in structure. No evidence of mitral valve regurgitation.  Tricuspid Valve: The tricuspid valve is normal in structure. Tricuspid valve regurgitation is mild.  Aortic Valve: The aortic valve is normal in structure. Aortic valve regurgitation is not visualized.  Pulmonic Valve: Pulmonic valve regurgitation is not visualized.  Aorta: The aortic root and ascending aorta are structurally normal, with no evidence of dilitation.  Venous: The inferior vena cava is normal in size with greater than 50% respiratory variability, suggesting right atrial pressure of 3 mmHg.  IAS/Shunts: The interatrial septum was not well visualized.   LEFT VENTRICLE PLAX 2D LVIDd:         4.60 cm     Diastology LVIDs:         2.70 cm     LV e' medial:    10.20 cm/s LV PW:         1.00 cm     LV E/e' medial:  8.4 LV IVS:        1.00 cm     LV e' lateral:   14.00 cm/s LVOT diam:     1.80 cm     LV E/e' lateral: 6.1 LV SV:         68 LV SV Index:   36 LVOT Area:     2.54 cm  LV Volumes (MOD) LV vol d, MOD A2C: 83.9 ml LV vol d, MOD A4C: 71.5 ml LV vol s, MOD A2C: 24.8 ml LV vol s, MOD A4C: 28.2 ml LV SV MOD A2C:     59.1 ml LV SV MOD A4C:     71.5 ml LV SV MOD BP:      51.4 ml  RIGHT VENTRICLE              IVC RV Basal diam:  3.90 cm     IVC diam: 2.00 cm RV S prime:     15.00 cm/s TAPSE (M-mode): 2.4 cm  LEFT ATRIUM             Index        RIGHT ATRIUM           Index LA diam:        3.50 cm 1.89 cm/m   RA Area:  16.20 cm LA Vol (A2C):   42.0 ml 22.64 ml/m  RA Volume:   39.50 ml  21.29 ml/m LA Vol (A4C):   39.0 ml 21.03 ml/m LA Biplane Vol: 43.3 ml 23.34 ml/m AORTIC VALVE LVOT Vmax:   138.00 cm/s LVOT Vmean:  88.800 cm/s LVOT VTI:    0.266 m  AORTA Ao Root diam: 3.10 cm Ao Asc diam:  2.80 cm  MITRAL VALVE               TRICUSPID VALVE MV Area (PHT): 3.43 cm    TR Peak grad:   29.4 mmHg MV Decel Time: 221 msec    TR Vmax:        271.00 cm/s MV E velocity: 86.10 cm/s MV A velocity: 88.30 cm/s  SHUNTS MV E/A ratio:  0.98        Systemic VTI:  0.27 m Systemic Diam: 1.80 cm  Carolan Clines Electronically signed by Carolan Clines Signature Date/Time: 10/09/2022/10:50:47 AM    Final    MONITORS  LONG TERM MONITOR-LIVE TELEMETRY (3-14 DAYS) 11/07/2022  Narrative 9 triggered events for sinus rhythm and AT. One episode in the AM of increased P to P and then a dropped beat. Benign.  No significant tachyarrhythmia or bradyarrhythmia. No atrial fibrillation or flutter.   Patch Wear Time:  13 days and 15 hours (2024-05-22T16:24:37-0400 to 2024-06-05T07:34:18-0400)  Patient had a min HR of 25 bpm, max HR of 197 bpm, and avg HR of 78 bpm. Predominant underlying rhythm was Sinus Rhythm. 1 run of Ventricular Tachycardia occurred lasting 4 beats with a max rate of 117 bpm (avg 115 bpm). 5 Supraventricular Tachycardia runs occurred, the run with the fastest interval lasting 6 beats with a max rate of 197 bpm, the longest lasting 16 beats with an avg rate of 107 bpm. Some episodes of Supraventricular Tachycardia may be possible Atrial Tachycardia with variable block. Ectopic Atrial Rhythm was present. Second Degree AV Block-Mobitz I (Wenckebach) was present. Supraventricular  Tachycardia was detected within +/- 45 seconds of symptomatic patient event(s). Isolated SVEs were rare (<1.0%), SVE Couplets were rare (<1.0%), and SVE Triplets were rare (<1.0%). Isolated VEs were rare (<1.0%), and no VE Couplets or VE Triplets were present. Ventricular Bigeminy was present.          Recent Labs: 06/28/2022: TSH 2.91 10/08/2022: Magnesium 2.2 10/12/2022: ALT 16; BUN 14; Creatinine, Ser 1.00; Hemoglobin 10.9; Platelets 260; Potassium 3.8; Sodium 137  Recent Lipid Panel    Component Value Date/Time   CHOL 164 06/28/2022 0847   TRIG 84.0 06/28/2022 0847   HDL 85.10 06/28/2022 0847   CHOLHDL 2 06/28/2022 0847   VLDL 16.8 06/28/2022 0847   LDLCALC 62 06/28/2022 0847    History of Present Illness    62 year old female with the above past medical history including hypertension, family history of premature CAD, syncope, IDA, sickle cell trait, and GERD.  She has a strong family history of premature CAD.  Cardiac catheterization in 2016 in the setting of chest pain showed no significant CAD.  Stress echo in 2019 was normal.  She was last seen in the office on 01/03/2022 and was stable from a cardiac standpoint.  She did note chest discomfort that was thought to be musculoskeletal in nature with reproducibility.  No further cardiac workup was recommended at the time.  She was hospitalized in May 2024 in the setting of syncope.  This was felt to be likely vasovagal in nature.  Echocardiogram at that time showed  EF 60 to 65%, normal LV function, no RWMA, mild tricuspid valve regurgitation.  Carotid Dopplers were unremarkable.  14-day live ZIO revealed predominantly sinus rhythm, 1 brief run of VT, 5 runs of SVT, open second-degree AV block Mobitz type I, PVCs and PACs, otherwise no significant arrhythmia.  She was seen in the ED on 10/11/2021 in the setting of sudden onset of tingling in her right foot that progressed up to her right leg and right hand, accompanied by a piercing headache.  CT  of the head and follow-up MRI/MRA revealed mild chronic small vessel ischemic changes, no evidence of stroke, no large vessel occlusion or significant stenosis.  Her symptoms improved with a migraine cocktail.  She has since seen neurology as an outpatient.  She underwent nerve testing per neurology which was essentially normal.  She presents today for follow-up.  Since her last visit and since her hospitalization he has been stable overall from a cardiac standpoint.  She does note intermittent dizziness, she denies presyncope, syncope.  She denies any significant palpitations.  She does note intermittent chest pain which she describes as a stabbing pain that lasts for 2 to 3 minutes at a time.  She has had symptoms both at rest and with activity.  She is very concerned that syncope vision to her chest discomfort could be anginal equivalent.  She notes that her family members have had dizziness/syncope prior to heart attacks.   Home Medications    Current Outpatient Medications  Medication Sig Dispense Refill   amLODipine (NORVASC) 5 MG tablet Take 1 tablet (5 mg total) by mouth daily. 90 tablet 0   cyclobenzaprine (FLEXERIL) 5 MG tablet TAKE 1-2 TABLETS (5-10 MG TOTAL) BY MOUTH AT BEDTIME. 30 tablet 1   diclofenac Sodium (VOLTAREN) 1 % GEL APPLY 4 G TOPICALLY 4 (FOUR) TIMES DAILY AS NEEDED. 100 g 3   dicyclomine (BENTYL) 20 MG tablet Take 1 tablet (20 mg total) by mouth in the morning and at bedtime. 20 tablet 0   famotidine (PEPCID) 40 MG tablet Take 1 tablet (40 mg total) by mouth daily. 90 tablet 1   hydrochlorothiazide (HYDRODIURIL) 25 MG tablet Take 1 tablet (25 mg total) by mouth daily. 90 tablet 3   lisinopril (ZESTRIL) 10 MG tablet TAKE 1 TABLET BY MOUTH TWICE A DAY 180 tablet 1   misoprostol (CYTOTEC) 200 MCG tablet Place one tablet in the vagina the night prior to your next clinic appointment 1 tablet 0   montelukast (SINGULAIR) 10 MG tablet Take 1 tablet (10 mg total) by mouth at bedtime.  90 tablet 0   pantoprazole (PROTONIX) 40 MG tablet Take 40 mg by mouth daily.     rosuvastatin (CRESTOR) 10 MG tablet Take 1 tablet (10 mg total) by mouth daily. 90 tablet 3   No current facility-administered medications for this visit.     Review of Systems    She denies palpitations, dyspnea, pnd, orthopnea, n, v, dizziness, syncope, edema, weight gain, or early satiety. All other systems reviewed and are otherwise negative except as noted above.   Physical Exam    VS:  BP 120/68 (BP Location: Left Arm, Patient Position: Sitting, Cuff Size: Normal)   Pulse 80   Ht 5\' 9"  (1.753 m)   Wt 153 lb 9.6 oz (69.7 kg)   LMP 10/08/2021   SpO2 99%   BMI 22.68 kg/m   GEN: Well nourished, well developed, in no acute distress. HEENT: normal. Neck: Supple, no JVD, carotid bruits, or  masses. Cardiac: RRR, no murmurs, rubs, or gallops. No clubbing, cyanosis, edema.  Radials/DP/PT 2+ and equal bilaterally.  Respiratory:  Respirations regular and unlabored, clear to auscultation bilaterally. GI: Soft, nontender, nondistended, BS + x 4. MS: no deformity or atrophy. Skin: warm and dry, no rash. Neuro:  Strength and sensation are intact. Psych: Normal affect.  Accessory Clinical Findings    ECG personally reviewed by me today - No EKG in office today   - no acute changes.   Lab Results  Component Value Date   WBC 7.4 10/12/2022   HGB 10.9 (L) 10/12/2022   HCT 32.0 (L) 10/12/2022   MCV 80.5 10/12/2022   PLT 260 10/12/2022   Lab Results  Component Value Date   CREATININE 1.00 10/12/2022   BUN 14 10/12/2022   NA 137 10/12/2022   K 3.8 10/12/2022   CL 104 10/12/2022   CO2 21 (L) 10/12/2022   Lab Results  Component Value Date   ALT 16 10/12/2022   AST 23 10/12/2022   ALKPHOS 54 10/12/2022   BILITOT 0.7 10/12/2022   Lab Results  Component Value Date   CHOL 164 06/28/2022   HDL 85.10 06/28/2022   LDLCALC 62 06/28/2022   TRIG 84.0 06/28/2022   CHOLHDL 2 06/28/2022    Lab  Results  Component Value Date   HGBA1C 6.0 06/28/2022    Assessment & Plan    1. History of syncope: Echo in 09/2022 was essentially normal.  Carotid Dopplers were unremarkable. 14-day ZIO revealed predominantly sinus rhythm, 1 brief run of VT, 5 runs of SVT, open second-degree AV block Mobitz type I, PVCs and PACs, otherwise no significant arrhythmia.  Workup to date overall reassuring.  She continues to have intermittent dizziness, denies any presyncope or syncope.  She does note that her family members have had dizziness/syncope prior to heart attacks she is concerned that this could be a sign of coronary artery disease.  Reinforced ED precautions.  Will pursue cardiac PET stress test as below.  2. NSVT/PSVT/second-degree AV block Mobitz type I: Recent Zio monitor as above.  She does note occasional dizziness, denies any associated palpitations, denies presyncope, or recurrent syncope.  Continue to monitor symptoms though caution with beta-blocker given evidence of second degree AV block Mobitz type 1.   3. Family history of CAD/precordial pain: Cardiac catheterization in 2016 in the setting of chest pain showed no significant CAD.  Stress echo in 2019 was normal. She notes intermittent chest discomfort which she describes as a stabbing pain that lasts for 2 to 3 minutes at a time.  She has had symptoms both at rest and with activity.  She is very concerned that syncope vision to her chest discomfort could be anginal equivalent.  She notes that her family members have had dizziness/syncope prior to heart attacks.  Unfortunately, she is not a good candidate for coronary CTA due to severe contrast allergy.  Therefore, through shared decision making, will pursue cardiac PET stress test.  Patient notes that she is pending possible hysterectomy in the near future.  If stress test is unremarkable, she will be okay to proceed with surgery.  Continue amlodipine, HCTZ, lisinopril, and Crestor.  Informed  Consent   Shared Decision Making/Informed Consent The risks [chest pain, shortness of breath, cardiac arrhythmias, dizziness, blood pressure fluctuations, myocardial infarction, stroke/transient ischemic attack, nausea, vomiting, allergic reaction, radiation exposure, metallic taste sensation and life-threatening complications (estimated to be 1 in 10,000)], benefits (risk stratification, diagnosing coronary artery disease, treatment  guidance) and alternatives of a cardiac PET stress test were discussed in detail with Ms. Begin and she agrees to proceed.    4. Hypertension: BP well controlled. Continue current antihypertensive regimen.   5. Disposition: Follow-up in 2 months.       Joylene Grapes, NP 11/15/2022, 11:10 AM

## 2022-11-15 NOTE — Patient Instructions (Signed)
Medication Instructions:  Your physician recommends that you continue on your current medications as directed. Please refer to the Current Medication list given to you today.  *If you need a refill on your cardiac medications before your next appointment, please call your pharmacy*   Lab Work: NONE ordered at this time of appointment     Testing/Procedures: How to Prepare for Your Cardiac PET/CT Stress Test:  1. Please do not take these medications before your test:   Medications that may interfere with the cardiac pharmacological stress agent (ex. nitrates - including erectile dysfunction medications, isosorbide mononitrate, tamulosin or beta-blockers) the day of the exam. (Erectile dysfunction medication should be held for at least 72 hrs prior to test) Theophylline containing medications for 12 hours. Dipyridamole 48 hours prior to the test. Your remaining medications may be taken with water.  2. Nothing to eat or drink, except water, 3 hours prior to arrival time.   NO caffeine/decaffeinated products, or chocolate 12 hours prior to arrival.  3. NO perfume, cologne or lotion  4. Total time is 1 to 2 hours; you may want to bring reading material for the waiting time.  5. Please report to Radiology at the [facility] Main Entrance 30 minutes early for your test.  [address] [place], [place] [address]  Diabetic Preparation:  Hold oral medications. You may take NPH and Lantus insulin. Do not take Humalog or Humulin R (Regular Insulin) the day of your test. Check blood sugars prior to leaving the house. If able to eat breakfast prior to 3 hour fasting, you may take all medications, including your insulin, Do not worry if you miss your breakfast dose of insulin - start at your next meal.  IF YOU THINK YOU MAY BE PREGNANT, OR ARE NURSING PLEASE INFORM THE TECHNOLOGIST.  In preparation for your appointment, medication and supplies will be purchased.  Appointment  availability is limited, so if you need to cancel or reschedule, please call the Radiology Department at [phone]  24 hours in advance to avoid a cancellation fee of $100.00  What to Expect After you Arrive:  Once you arrive and check in for your appointment, you will be taken to a preparation room within the Radiology Department.  A technologist or Nurse will obtain your medical history, verify that you are correctly prepped for the exam, and explain the procedure.  Afterwards,  an IV will be started in your arm and electrodes will be placed on your skin for EKG monitoring during the stress portion of the exam. Then you will be escorted to the PET/CT scanner.  There, staff will get you positioned on the scanner and obtain a blood pressure and EKG.  During the exam, you will continue to be connected to the EKG and blood pressure machines.  A small, safe amount of a radioactive tracer will be injected in your IV to obtain a series of pictures of your heart along with an injection of a stress agent.    After your Exam:  It is recommended that you eat a meal and drink a caffeinated beverage to counter act any effects of the stress agent.  Drink plenty of fluids for the remainder of the day and urinate frequently for the first couple of hours after the exam.  Your doctor will inform you of your test results within 7-10 business days.  For more information and frequently asked questions, please visit our website : [email or web address]  For questions about your test or  how to prepare for your test, please call: Rockwell Alexandria, Cardiac Imaging Nurse Navigator  Larey Brick, Cardiac Imaging Nurse Navigator Office: [phone]    Follow-Up: At [facility], you and your health needs are our priority.  As part of our continuing mission to provide you with exceptional heart care, we have created designated Provider Care Teams.  These Care Teams include your primary Cardiologist  (physician) and Advanced Practice Providers (APPs -  Physician Assistants and Nurse Practitioners) who all work together to provide you with the care you need, when you need it.  We recommend signing up for the patient portal called "MyChart".  Sign up information is provided on this After Visit Summary.  MyChart is used to connect with patients for Virtual Visits (Telemedicine).  Patients are able to view lab/test results, encounter notes, upcoming appointments, etc.  Non-urgent messages can be sent to your provider as well.   To learn more about what you can do with MyChart, go to [email or web address].    Your next appointment:   2 month(s)  Provider:   Maisie Fus, MD  or Bernadene Person, NP        Other Instructions

## 2022-11-16 IMAGING — CT CT ABD-PELV W/O CM
2 of 4 series · 17 of 46 positions shown, 19 images · non-contrast
Comparison: 07/17/2018

CLINICAL DATA: Abdominal pain, blood in stool



[Series 2: axial st · axial · 0.83mm/px · z∈[-485,-115]mm · 14 of 84 slices shown, 16 images]
[im 5/84  soft-tissue]
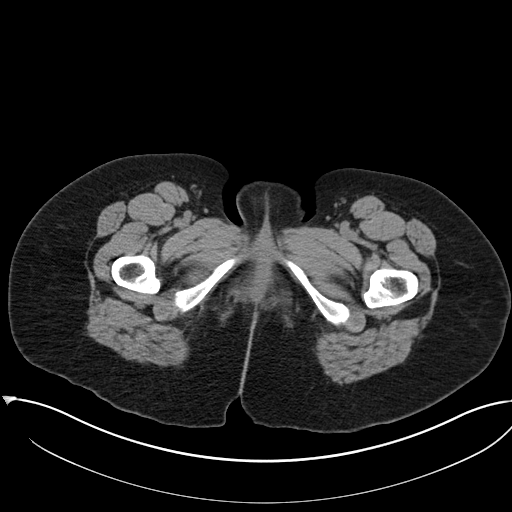
[im 5/84  bone]
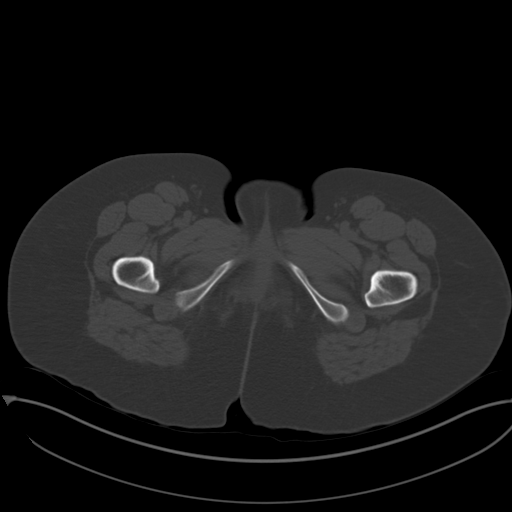
[im 10/84  soft-tissue]
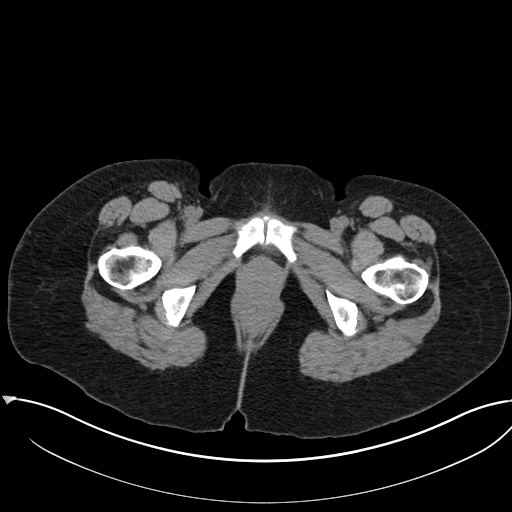
[im 15/84  soft-tissue]
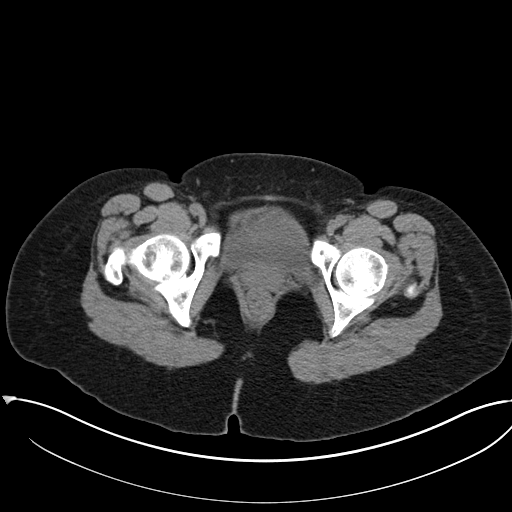
[im 25/84  soft-tissue]
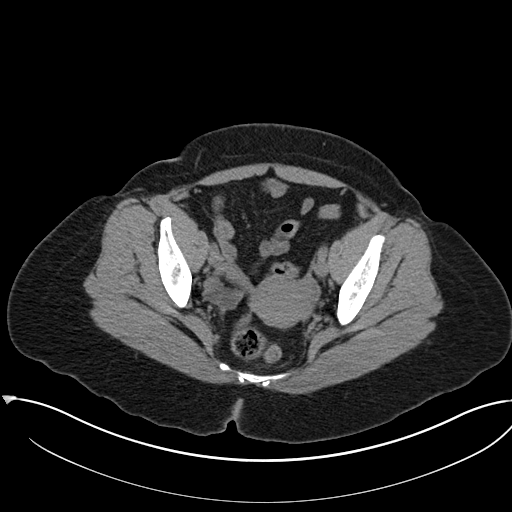
[im 30/84  soft-tissue]
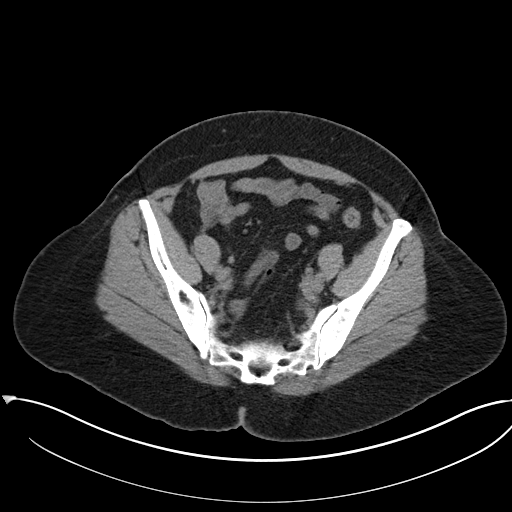
[im 35/84  soft-tissue]
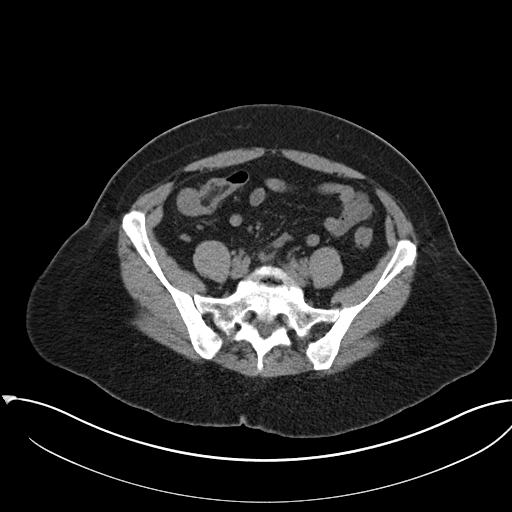
[im 40/84  soft-tissue]
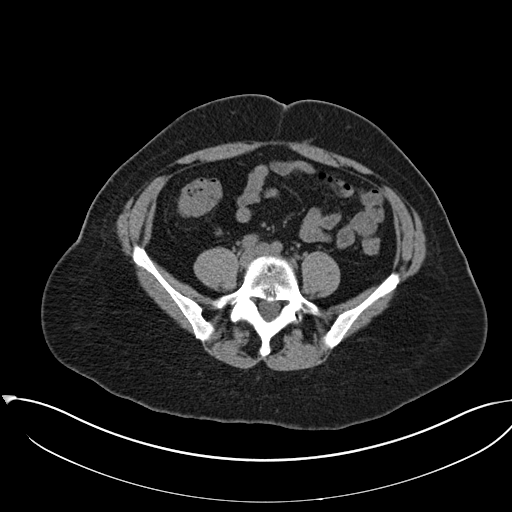
[im 44/84  soft-tissue]
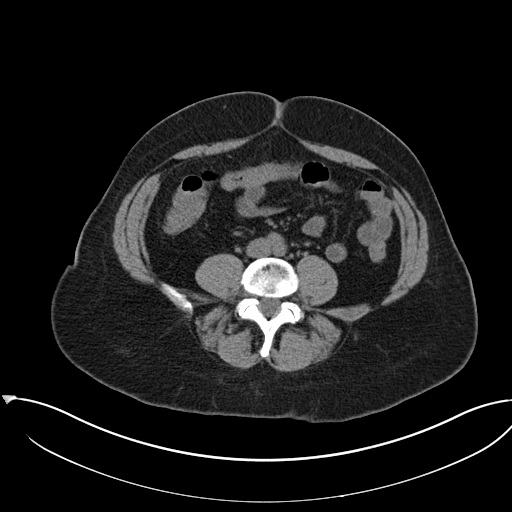
[im 49/84  soft-tissue]
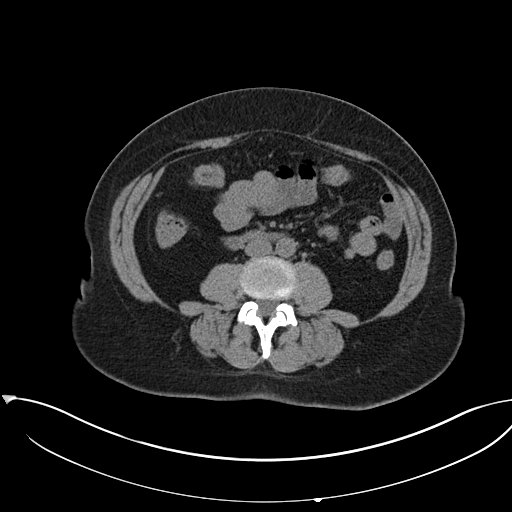
[im 49/84  bone]
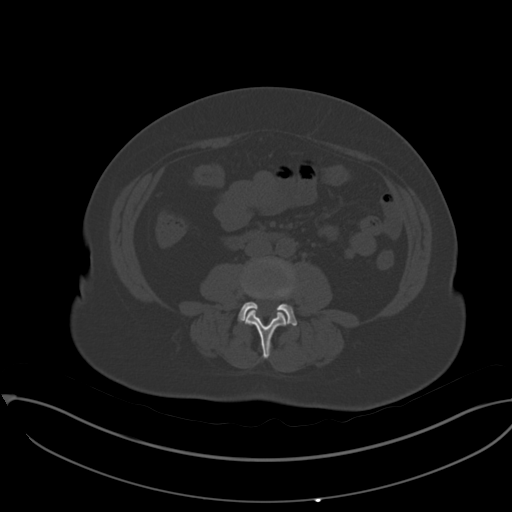
[im 54/84  soft-tissue]
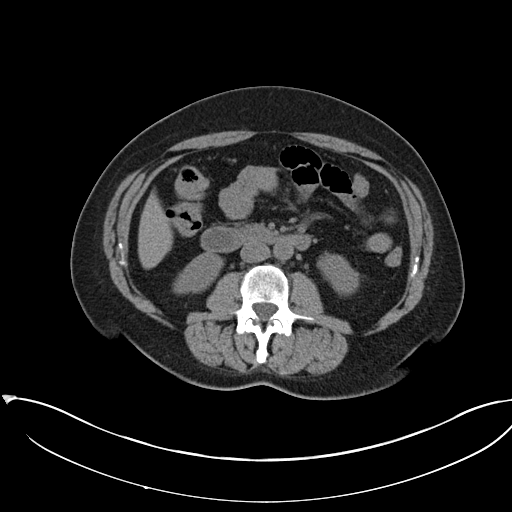
[im 64/84  soft-tissue]
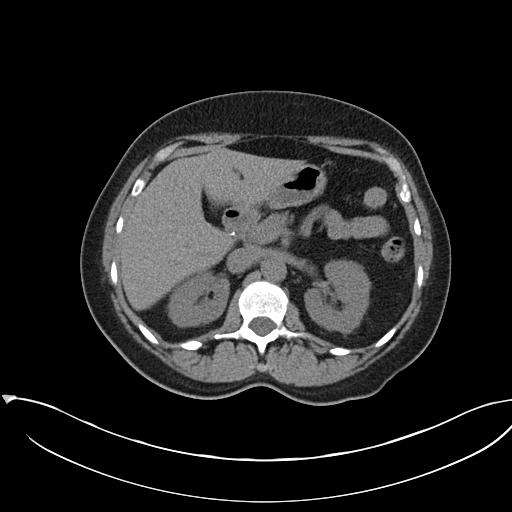
[im 69/84  soft-tissue]
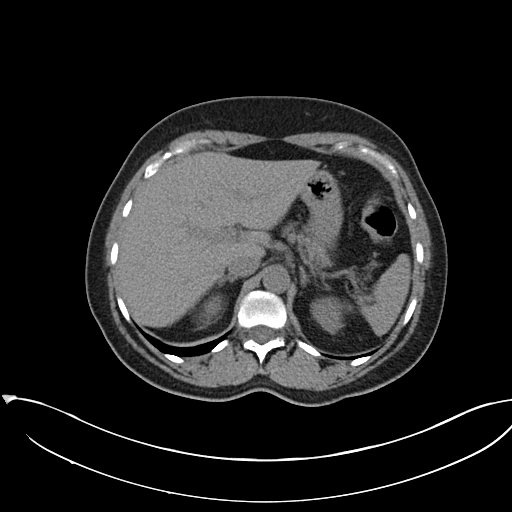
[im 74/84  soft-tissue]
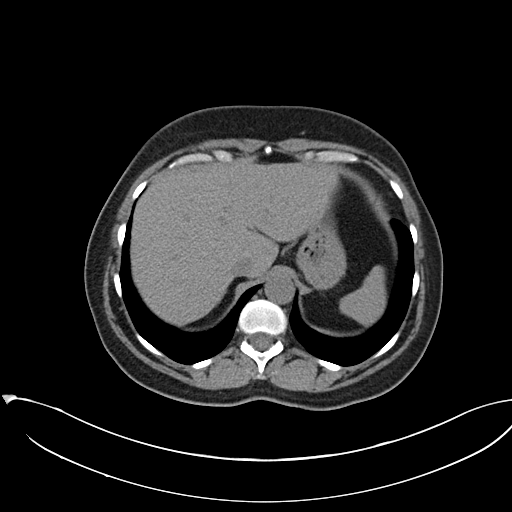
[im 79/84  soft-tissue]
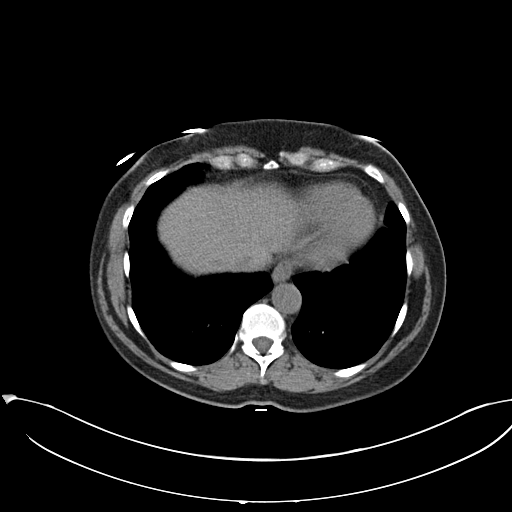

[Series 5: coronal st · coronal · 0.84mm/px · 3 of 156 slices shown]
[im 52/156  soft-tissue]
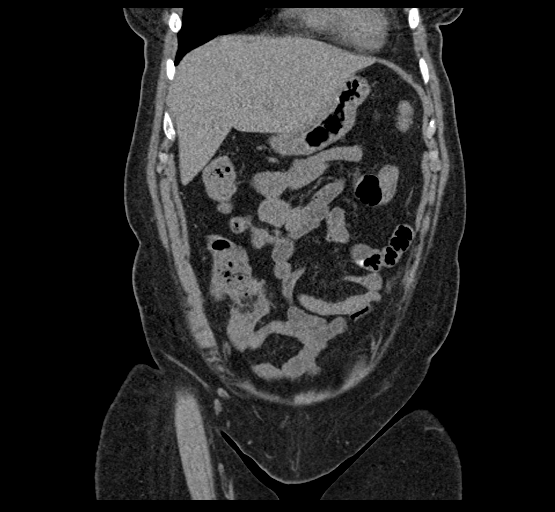
[im 69/156  soft-tissue]
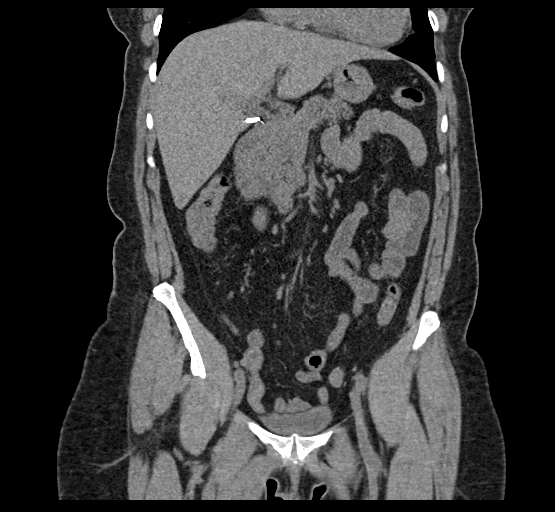
[im 87/156  soft-tissue]
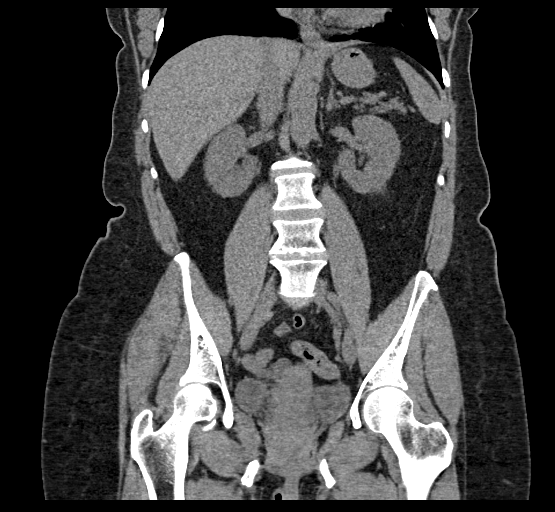

[17 of 46 positions shown; findings below may reference images not displayed]

FINDINGS: Lower chest: Lung bases are clear.

Hepatobiliary: Unenhanced liver is unremarkable.

Status post cholecystectomy. No intrahepatic or extrahepatic ductal
dilatation.

Pancreas: Within normal limits.

Spleen: Within normal limits.

Adrenals/Urinary Tract: Adrenal glands are within normal limits.

Kidneys are within normal limits. No renal calculi or
hydronephrosis.

Bladder is within normal limits.

Stomach/Bowel: Stomach is within normal limits.

No evidence of bowel obstruction.

Normal appendix (series 2/image 61).

No colonic wall thickening or mass is evident on unenhanced CT.

Vascular/Lymphatic: No evidence of abdominal aortic aneurysm.

No suspicious abdominopelvic lymphadenopathy.

Reproductive: Uterus is within normal limits.

Bilateral ovaries are within normal limits.

Other: No abdominopelvic ascites.

Musculoskeletal: Very mild degenerative changes of the lower lumbar
spine.
IMPRESSION: Negative unenhanced CT abdomen/pelvis. No colonic wall thickening or
mass is evident on CT.

## 2022-12-01 ENCOUNTER — Other Ambulatory Visit: Payer: Self-pay | Admitting: Family Medicine

## 2022-12-01 DIAGNOSIS — G8929 Other chronic pain: Secondary | ICD-10-CM

## 2022-12-01 NOTE — Telephone Encounter (Signed)
Chart supports rx. Last OV: [id number] Next OV: [id number]  

## 2022-12-04 ENCOUNTER — Other Ambulatory Visit: Payer: Self-pay | Admitting: Family Medicine

## 2022-12-04 DIAGNOSIS — I10 Essential (primary) hypertension: Secondary | ICD-10-CM

## 2022-12-05 ENCOUNTER — Encounter: Payer: Self-pay | Admitting: Obstetrics and Gynecology

## 2022-12-05 ENCOUNTER — Other Ambulatory Visit (HOSPITAL_COMMUNITY)
Admission: RE | Admit: 2022-12-05 | Discharge: 2022-12-05 | Disposition: A | Discharge status: Home / Self Care | Payer: 59 | Source: Ambulatory Visit | Attending: Obstetrics and Gynecology | Admitting: Obstetrics and Gynecology

## 2022-12-05 ENCOUNTER — Ambulatory Visit (INDEPENDENT_AMBULATORY_CARE_PROVIDER_SITE_OTHER): Payer: 59 | Admitting: Obstetrics and Gynecology

## 2022-12-05 VITALS — BP 99/63 | HR 79 | Wt 155.0 lb

## 2022-12-05 DIAGNOSIS — Z124 Encounter for screening for malignant neoplasm of cervix: Secondary | ICD-10-CM

## 2022-12-05 DIAGNOSIS — N939 Abnormal uterine and vaginal bleeding, unspecified: Secondary | ICD-10-CM | POA: Insufficient documentation

## 2022-12-05 NOTE — Progress Notes (Signed)
    GYNECOLOGY VISIT  Patient name: Barbara [name] MRN [id number]  Date of birth: Apr 27, 1961 Chief Complaint:   No chief complaint on file.   History:  Barbara [name] is a 62 y.o. [phone] being seen today for AUB.    Past Medical History:  Diagnosis Date   Allergy 1965   Nuts, trees, grass, bees, red ants   Anemia 1980   Arthritis 2007   GERD (gastroesophageal reflux disease)    Hypertension    Low iron    Sickle cell trait (HCC)    Ulcer 1981   Viral gastroenteritis 07/24/2022    Past Surgical History:  Procedure Laterality Date   ANTERIOR CRUCIATE LIGAMENT REPAIR     CHOLECYSTECTOMY     ENDOMETRIAL ABLATION     LEFT HEART CATHETERIZATION WITH CORONARY ANGIOGRAM N/A 08/27/2014   Procedure: LEFT HEART CATHETERIZATION WITH CORONARY ANGIOGRAM;  Surgeon: Iran Ouch, MD;  Location: [facility] CATH LAB;  Service: Cardiovascular;  Laterality: N/A;   TUBAL LIGATION  1980    The following portions of the patient's history were reviewed and updated as appropriate: allergies, current medications, past family history, past medical history, past social history, past surgical history and problem list.   Health Maintenance:   Last pap No results found for: "DIAGPAP", "HPVHIGH", "ADEQPAP"    Review of Systems:  Pertinent items are noted in HPI. Comprehensive review of systems was otherwise negative.   Objective:  Physical Exam BP 99/63   Pulse 79   Wt 155 lb (70.3 kg)   BMI 22.89 kg/m    Physical Exam  Endometrial Biopsy Procedure  Patient identified, informed consent performed,  indication reviewed, consent signed.  Reviewed risk of perforation, pain, bleeding, insufficient sample, etc were reviewd. Time out was performed.  Urine pregnancy test negative.  Speculum placed in the vagina.  Cervix visualized.  Cleaned with Betadine x 2.  Anterior cervix infiltrated grasped anteriorly with a single tooth tenaculum.  Paracervical block was not administered.  Endometrial pipelle was  used to draw up 1cc of 1% lidocaine, introduced into the cervical os and instilled into the endometrial cavity.  The pipelle was passed twice without difficulty and sample obtained. Tenaculum was removed, good hemostasis noted.  Patient tolerated procedure well.  Patient was given post-procedure instructions.     Assessment & Plan:   1. Abnormal uterine bleeding (AUB) Now s/p EMB - Surgical pathology  2. Screening for cervical cancer S/p pap  - Cytology - PAP    Lorriane Shire, MD Minimally Invasive Gynecologic Surgery [facility], [facility]

## 2022-12-05 NOTE — Telephone Encounter (Signed)
Chart supports rx. Last OV: [id number] Next OV: [id number]

## 2022-12-06 LAB — SURGICAL PATHOLOGY

## 2022-12-08 ENCOUNTER — Encounter: Payer: Self-pay | Admitting: Family Medicine

## 2022-12-08 ENCOUNTER — Ambulatory Visit (INDEPENDENT_AMBULATORY_CARE_PROVIDER_SITE_OTHER): Payer: 59 | Admitting: Family Medicine

## 2022-12-08 VITALS — BP 98/62 | HR 92 | Temp 96.5°F | Ht 68.0 in | Wt 155.8 lb

## 2022-12-08 DIAGNOSIS — E876 Hypokalemia: Secondary | ICD-10-CM

## 2022-12-08 DIAGNOSIS — I10 Essential (primary) hypertension: Secondary | ICD-10-CM

## 2022-12-08 DIAGNOSIS — R252 Cramp and spasm: Secondary | ICD-10-CM

## 2022-12-08 DIAGNOSIS — D649 Anemia, unspecified: Secondary | ICD-10-CM

## 2022-12-08 DIAGNOSIS — R42 Dizziness and giddiness: Secondary | ICD-10-CM

## 2022-12-08 DIAGNOSIS — I959 Hypotension, unspecified: Secondary | ICD-10-CM

## 2022-12-08 DIAGNOSIS — I7300 Raynaud's syndrome without gangrene: Secondary | ICD-10-CM

## 2022-12-08 DIAGNOSIS — D500 Iron deficiency anemia secondary to blood loss (chronic): Secondary | ICD-10-CM | POA: Diagnosis not present

## 2022-12-08 DIAGNOSIS — R000 Tachycardia, unspecified: Secondary | ICD-10-CM

## 2022-12-08 DIAGNOSIS — N939 Abnormal uterine and vaginal bleeding, unspecified: Secondary | ICD-10-CM

## 2022-12-08 DIAGNOSIS — R200 Anesthesia of skin: Secondary | ICD-10-CM

## 2022-12-08 LAB — CBC WITH DIFFERENTIAL/PLATELET
Basophils Absolute: 0 10*3/uL (ref 0.0–0.1)
Basophils Relative: 0.6 % (ref 0.0–3.0)
Eosinophils Absolute: 0.1 10*3/uL (ref 0.0–0.7)
Eosinophils Relative: 1.1 % (ref 0.0–5.0)
HCT: 28.7 % — ABNORMAL LOW (ref 36.0–46.0)
Hemoglobin: 9.4 g/dL — ABNORMAL LOW (ref 12.0–15.0)
Lymphocytes Relative: 38.4 % (ref 12.0–46.0)
Lymphs Abs: 2.1 10*3/uL (ref 0.7–4.0)
MCHC: 32.9 g/dL (ref 30.0–36.0)
MCV: 86.1 fl (ref 78.0–100.0)
Monocytes Absolute: 0.5 10*3/uL (ref 0.1–1.0)
Monocytes Relative: 9 % (ref 3.0–12.0)
Neutro Abs: 2.8 10*3/uL (ref 1.4–7.7)
Neutrophils Relative %: 50.9 % (ref 43.0–77.0)
Platelets: 269 10*3/uL (ref 150.0–400.0)
RBC: 3.33 Mil/uL — ABNORMAL LOW (ref 3.87–5.11)
RDW: 13.9 % (ref 11.5–15.5)
WBC: 5.5 10*3/uL (ref 4.0–10.5)

## 2022-12-08 LAB — BASIC METABOLIC PANEL
BUN: 23 mg/dL (ref 6–23)
CO2: 29 mEq/L (ref 19–32)
Calcium: 9.7 mg/dL (ref 8.4–10.5)
Chloride: 103 mEq/L (ref 96–112)
Creatinine, Ser: 1.06 mg/dL (ref 0.40–1.20)
GFR: 56.84 mL/min — ABNORMAL LOW (ref >60.00)
Glucose, Bld: 93 mg/dL (ref 70–99)
Potassium: 4.7 mEq/L (ref 3.5–5.1)
Sodium: 140 mEq/L (ref 135–145)

## 2022-12-08 MED ORDER — LISINOPRIL 10 MG PO TABS: 10.0000 mg | ORAL_TABLET | Freq: Every day | ORAL | Status: DC

## 2022-12-08 NOTE — Assessment & Plan Note (Signed)
History of low hemoglobin; monitor with CBC. Follow-up on recent bleeding and biopsy results.

## 2022-12-08 NOTE — Assessment & Plan Note (Signed)
Recent biopsy performed; no acute issues. Monitor and follow up as necessary.

## 2022-12-08 NOTE — Progress Notes (Signed)
Assessment/Plan:   Problem List Items Addressed This Visit       Cardiovascular and Mediastinum   Primary hypertension - Primary    Persistent low blood pressure readings at home. Discontinue HCTZ due to its diuretic effect contributing to low potassium and low BP. Monitor blood pressure and potassium levels closely.      Relevant Medications   lisinopril (ZESTRIL) 10 MG tablet     Genitourinary   Abnormal uterine bleeding (AUB)    Recent biopsy performed; no acute issues. Monitor and follow up as necessary.        Other   Raynaud's phenomenon without gangrene    Continue with current management plan. Patient to monitor symptoms and ensuring warm extremities.      Hypokalemia   Relevant Orders   Basic Metabolic Panel (BMET)   Iron deficiency anemia due to chronic blood loss    History of low hemoglobin; monitor with CBC. Follow-up on recent bleeding and biopsy results.      Relevant Orders   CBC w/Diff   Tachycardia    Cardiologist recommended a cardiac stress test. Patient instructed to follow up on scheduling the test.      Other Visit Diagnoses     Hypotension, unspecified hypotension type       Relevant Medications   lisinopril (ZESTRIL) 10 MG tablet   Dizziness       Leg cramps       Numbness, limb           Medications Discontinued During This Encounter  Medication Reason   misoprostol (CYTOTEC) 200 MCG tablet Completed Course   hydrochlorothiazide (HYDRODIURIL) 25 MG tablet    lisinopril (ZESTRIL) 10 MG tablet     Return in about 3 months (around 03/10/2023), or or sooner if symptoms worsen or fail to improve, for BP.    Subjective:   Encounter date: 12/08/2022  Barbara [name] is a 62 y.o. female who has GERD (gastroesophageal reflux disease); Abnormal uterine bleeding (AUB); Primary hypertension; Lower back pain; Onychomycosis; Raynaud's phenomenon without gangrene; Syncope and collapse; Hypokalemia; Prolonged QT interval; Diarrhea;  Iron deficiency anemia due to chronic blood loss; and Tachycardia on their problem list..   She  has a past medical history of Allergy (1965), Anemia (1980), Arthritis (2007), GERD (gastroesophageal reflux disease), Hypertension, Low iron, Sickle cell trait (HCC), Ulcer (1981), and Viral gastroenteritis (07/24/2022)..   Chief Complaint: Follow-up for low blood pressure, dizziness, cold extremities, numbness.  History of Present Illness:  Low Blood Pressure. Patient Barbara [name] presents for follow-up regarding low blood pressure issues. Current medications include lisinopril 10mg  once a day, hydrochlorothiazide (HCTZ) 25mg  daily, and amlodipine 5mg  for Raynaud's phenomenon and hypertension. The patient has recorded blood pressure readings at home with the lowest reading of 90/66 mmHg and the highest at 99/72 mmHg.   Dizziness and Numbness. The patient reports intermittent dizziness and numbness in her hands and toes, which sometimes go cold and numb during the day. She recently saw Dr. Everlena Cooper in Neurology, who performed a nerve conduction study that returned normal. A cervical scan is planned to investigate further for cervical stenosis.  Raynaud's Phenomenon. Continued cold extremities and numbness, particularly with exposure to cold environments. Blood work to recheck potassium levels was requested due to recent symptoms of dizziness and muscle cramps which have re-emerged after hospitalization.  Intermittent Tachycardia and Family History of Heart Disease. Follow-up with cardiology advised. Cardiac stress test has been authorized and instructions provided via MyChart but no appointment scheduled  yet. The patient needs to call to arrange this test.  Abnormal Uterine Bleeding. Recent visit to Dr. Jeralene Huff, OBGYN, for postmenopausal bleeding. An ultrasound revealed a small mass; a biopsy was performed on July 15th and indicated no acute issues.  Review of Systems  Constitutional:  Negative for  chills, diaphoresis, fever, malaise/fatigue and weight loss.  HENT:  Negative for congestion, ear discharge, ear pain and hearing loss.   Eyes:  Negative for blurred vision, double vision, photophobia, pain, discharge and redness.  Respiratory:  Negative for cough, sputum production, shortness of breath and wheezing.   Cardiovascular:  Positive for palpitations. Negative for chest pain.  Gastrointestinal:  Negative for abdominal pain, blood in stool, constipation, diarrhea, melena, nausea and vomiting.  Genitourinary:  Negative for dysuria, flank pain, frequency, hematuria and urgency.  Musculoskeletal:  Negative for myalgias.  Skin:  Negative for itching and rash.  Neurological:  Positive for dizziness and tingling. Negative for tremors, speech change, seizures, loss of consciousness, weakness and headaches.  Psychiatric/Behavioral:  Negative for depression, hallucinations, memory loss, substance abuse and suicidal ideas. The patient does not have insomnia.   All other systems reviewed and are negative.   Past Surgical History:  Procedure Laterality Date   ANTERIOR CRUCIATE LIGAMENT REPAIR     CHOLECYSTECTOMY     ENDOMETRIAL ABLATION     LEFT HEART CATHETERIZATION WITH CORONARY ANGIOGRAM N/A 08/27/2014   Procedure: LEFT HEART CATHETERIZATION WITH CORONARY ANGIOGRAM;  Surgeon: Iran Ouch, MD;  Location: [facility]ATH [facility];  Service: Cardiovascular;  Laterality: N/A;   TUBAL LIGATION  1980    Outpatient Medications Prior to Visit  Medication Sig Dispense Refill   amLODipine (NORVASC) 5 MG tablet TAKE 1 TABLET (5 MG TOTAL) BY MOUTH DAILY. 90 tablet 0   cyclobenzaprine (FLEXERIL) 5 MG tablet TAKE 1-2 TABLETS (5-10 MG TOTAL) BY MOUTH AT BEDTIME. 30 tablet 1   diclofenac Sodium (VOLTAREN) 1 % GEL APPLY 4 G TOPICALLY 4 (FOUR) TIMES DAILY AS NEEDED. 100 g 3   dicyclomine (BENTYL) 20 MG tablet Take 1 tablet (20 mg total) by mouth in the morning and at bedtime. 20 tablet 0   famotidine (PEPCID)  40 MG tablet Take 1 tablet (40 mg total) by mouth daily. 90 tablet 1   montelukast (SINGULAIR) 10 MG tablet Take 1 tablet (10 mg total) by mouth at bedtime. (Patient taking differently: Take 10 mg by mouth as needed (Allergies).) 90 tablet 0   pantoprazole (PROTONIX) 40 MG tablet Take 40 mg by mouth daily.     rosuvastatin (CRESTOR) 10 MG tablet Take 1 tablet (10 mg total) by mouth daily. 90 tablet 3   hydrochlorothiazide (HYDRODIURIL) 25 MG tablet Take 1 tablet (25 mg total) by mouth daily. 90 tablet 3   lisinopril (ZESTRIL) 10 MG tablet TAKE 1 TABLET BY MOUTH TWICE A DAY (Patient taking differently: Take 10 mg by mouth daily.) 180 tablet 1   misoprostol (CYTOTEC) 200 MCG tablet Place one tablet in the vagina the night prior to your next clinic appointment (Patient not taking: Reported on 12/08/2022) 1 tablet 0   No facility-administered medications prior to visit.    Family History  Problem Relation Age of Onset   CAD Brother 80       CABG at age 31, and deceased   Heart disease Brother    Hypertension Brother    CAD Sister 34       1 stent   CAD Father 104  Deceased in 70s   Heart disease Father    Hypertension Father    Cancer Mother    Heart disease Brother    Hypertension Brother    Hypertension Daughter    Hypertension Maternal Aunt    Stroke Maternal Aunt    Hypertension Paternal Uncle     Social History   Socioeconomic History   Marital status: Divorced    Spouse name: Not on file   Number of children: 2   Years of education: Not on file   Highest education level: Bachelor's degree (e.g., BA, AB, BS)  Occupational History   Not on file  Tobacco Use   Smoking status: Former    Current packs/day: 0.00    Types: Cigarettes    Quit date: 08/27/2011    Years since quitting: 11.2   Smokeless tobacco: Never  Vaping Use   Vaping status: Never Used  Substance and Sexual Activity   Alcohol use: Yes    Comment: occasionally, glass of wine   Drug use: No   Sexual  activity: Yes    Birth control/protection: Surgical  Other Topics Concern   Not on file  Social History Narrative   Right handed   Drinks caffeine   Lives with brother 2nd floor apartment   employed   Social Determinants of Health   Financial Resource Strain: Low Risk  (10/15/2022)   Overall Financial Resource Strain (CARDIA)    Difficulty of Paying Living Expenses: Not very hard  Food Insecurity: No Food Insecurity (10/15/2022)   Hunger Vital Sign    Worried About Running Out of Food in the Last Year: Never true    Ran Out of Food in the Last Year: Never true  Transportation Needs: No Transportation Needs (10/15/2022)   PRAPARE - Administrator, Civil Service (Medical): No    Lack of Transportation (Non-Medical): No  Physical Activity: Sufficiently Active (10/15/2022)   Exercise Vital Sign    Days of Exercise per Week: 5 days    Minutes of Exercise per Session: 50 min  Stress: Stress Concern Present (10/15/2022)   [employer] of Occupational Health - Occupational Stress Questionnaire    Feeling of Stress : Rather much  Social Connections: Moderately Isolated (10/15/2022)   Social Connection and Isolation Panel [NHANES]    Frequency of Communication with Friends and Family: More than three times a week    Frequency of Social Gatherings with Friends and Family: More than three times a week    Attends Religious Services: 1 to 4 times per year    Active Member of [employer] or Organizations: No    Attends Banker Meetings: Not on file    Marital Status: Divorced  Intimate Partner Violence: Not At Risk (10/09/2022)   Humiliation, Afraid, Rape, and Kick questionnaire    Fear of Current or Ex-Partner: No    Emotionally Abused: No    Physically Abused: No    Sexually Abused: No  Objective:  Physical Exam: BP 98/62   Pulse 92   Temp (!) 96.5 F (35.8 C)  (Temporal)   Ht 5\' 8"  (1.727 m)   Wt 155 lb 12.8 oz (70.7 kg)   LMP 10/09/2022   SpO2 99%   BMI 23.69 kg/m     Physical Exam Constitutional:      General: She is not in acute distress.    Appearance: Normal appearance. She is not ill-appearing or toxic-appearing.  HENT:     Head: Normocephalic and atraumatic.     Nose: Nose normal. No congestion.  Eyes:     General: No scleral icterus.    Extraocular Movements: Extraocular movements intact.  Cardiovascular:     Rate and Rhythm: Normal rate and regular rhythm.     Pulses: Normal pulses.     Heart sounds: Normal heart sounds.  Pulmonary:     Effort: Pulmonary effort is normal. No respiratory distress.     Breath sounds: Normal breath sounds.  Abdominal:     General: Abdomen is flat. Bowel sounds are normal.     Palpations: Abdomen is soft.  Musculoskeletal:        General: Normal range of motion.  Lymphadenopathy:     Cervical: No cervical adenopathy.  Skin:    General: Skin is warm and dry.     Findings: No rash.  Neurological:     General: No focal deficit present.     Mental Status: She is alert and oriented to person, place, and time. Mental status is at baseline.  Psychiatric:        Mood and Affect: Mood normal.        Behavior: Behavior normal.        Thought Content: Thought content normal.        Judgment: Judgment normal.     LONG TERM MONITOR-LIVE TELEMETRY (3-14 DAYS)  Result Date: 11/07/2022 9 triggered events for sinus rhythm and AT. One episode in the AM of increased P to P and then a dropped beat. Benign.  No significant tachyarrhythmia or bradyarrhythmia. No atrial fibrillation or flutter. Patch Wear Time:  13 days and 15 hours (2024-05-22T16:24:37-0400 to 2024-06-05T07:34:18-0400) Patient had a min HR of 25 bpm, max HR of 197 bpm, and avg HR of 78 bpm. Predominant underlying rhythm was Sinus Rhythm. 1 run of Ventricular Tachycardia occurred lasting 4 beats with a max rate of 117 bpm (avg 115 bpm). 5  Supraventricular Tachycardia runs occurred, the run with the fastest interval lasting 6 beats with a max rate of 197 bpm, the longest lasting 16 beats with an avg rate of 107 bpm. Some episodes of Supraventricular Tachycardia may be possible Atrial Tachycardia with variable block. Ectopic Atrial Rhythm was present. Second Degree AV Block-Mobitz I (Wenckebach) was present. Supraventricular Tachycardia was detected within +/- 45 seconds of symptomatic patient event(s). Isolated SVEs were rare (<1.0%), SVE Couplets were rare (<1.0%),  and SVE Triplets were rare (<1.0%). Isolated VEs were rare (<1.0%), and no VE Couplets or VE Triplets were present. Ventricular Bigeminy was present.   NCV with EMG(electromyography)  Result Date: 11/04/2022 Glendale Chard, DO     11/04/2022 11:57 AM [facility] [address] [place], [address]  [place], [place] [address] Tel: [phone] Fax: [phone] Test Date:  11/04/2022 Patient: Gitty Osterlund DOB: March 04, 1961 Physician: Nita Sickle, DO Sex: Female Height: 5\' 9"  Ref Phys: Shon Millet, DO ID#: [id number]   Technician:  History: This is a 62 year old female referred for evaluation  of paresthesias involving the right arm and leg. NCV & EMG Findings: Extensive electrodiagnostic testing of the right upper and lower extremity shows: All sensory responses including the right median, ulnar, mixed palmar, sural, and superficial peroneal nerves are within normal limits. All motor responses including the right median, ulnar, peroneal, and tibial nerves are within normal limits. There is no evidence of active or chronic motor axonal loss changes affecting any of the tested muscles.  Motor unit configuration and recruitment pattern is within normal limits. Impression: This is a normal study of the right upper and lower extremities.  In particular, there is no evidence of a large fiber sensorimotor polyneuropathy, cervical/lumbosacral radiculopathy, or carpal tunnel syndrome.  ___________________________ Nita Sickle, DO Nerve Conduction Studies  Stim Site NR Peak (ms) Norm Peak (ms) O-P Amp (V) Norm O-P Amp Right Median Anti Sensory (2nd Digit)  32 C Wrist    3.1 <3.8 40.4 >10 Right Sup Peroneal Anti Sensory (Ant Lat Mall)  32 C 12 cm    2.5 <4.6 14.3 >3 Right Sural Anti Sensory (Lat Mall)  32 C Calf    2.5 <4.6 10.0 >3 Right Ulnar Anti Sensory (5th Digit)  32 C Wrist    2.7 <3.2 42.9 >5  Stim Site NR Onset (ms) Norm Onset (ms) O-P Amp (mV) Norm O-P Amp Site1 Site2 Delta-0 (ms) Dist (cm) Vel (m/s) Norm Vel (m/s) Right Median Motor (Abd Poll Brev)  32 C Wrist    2.7 <4.0 15.1 >5 Elbow Wrist 5.0 30.0 60 >50 Elbow    7.7  14.4        Right Peroneal Motor (Ext Dig Brev)  32 C Ankle    3.2 <6.0 2.6 >2.5 B Fib Ankle 6.9 40.0 58 >40 B Fib    10.1  2.1  Poplt B Fib 1.7 8.0 47 >40 Poplt    11.8  1.9        Right Tibial Motor (Abd Hall Brev)  32 C Ankle    4.1 <6.0 4.1 >4 Knee Ankle 7.2 45.0 62 >40 Knee    11.3  3.3        Right Ulnar Motor (Abd Dig Minimi)  32 C Wrist    1.9 <3.1 10.7 >7 B Elbow Wrist 4.4 23.0 52 >50 B Elbow    6.3  10.1  A Elbow B Elbow 1.7 10.0 59 >50 A Elbow    8.0  9.5         Stim Site NR Peak (ms) Norm Peak (ms) P-T Amp (V) Site1 Site2 Delta-P (ms) Norm Delta (ms) Right Median/Ulnar Palm Comparison (Wrist - 8cm)  32 C Median Palm    1.7 <2.2 61.5 Median Palm Ulnar Palm 0.2  Ulnar Palm    1.5 <2.2 16.4     Electromyography  Side Muscle Ins.Act Fibs Fasc Recrt Amp Dur Poly Activation Comment Right 1stDorInt Nml Nml Nml Nml Nml Nml Nml Nml N/A Right Abd Poll Brev Nml Nml Nml Nml Nml Nml Nml Nml N/A Right PronatorTeres Nml Nml Nml Nml Nml Nml Nml Nml N/A Right Biceps Nml Nml Nml Nml Nml Nml Nml Nml N/A Right Triceps Nml Nml Nml Nml Nml Nml Nml Nml N/A Right Deltoid Nml Nml Nml Nml Nml Nml Nml Nml N/A Right AntTibialis Nml Nml Nml Nml Nml Nml Nml Nml N/A Right Gastroc Nml Nml Nml Nml Nml Nml Nml Nml N/A Right Flex Dig Long Nml Nml Nml Nml Nml Nml Nml Nml N/A Right  BicepsFemS Nml Nml Nml  Nml Nml Nml Nml Nml N/A Right GluteusMed Nml Nml Nml Nml Nml Nml Nml Nml N/A Waveforms:                  US PELVIC COMPLETE WITH TRANSVAGINAL  Result Date: 11/02/2022 CLINICAL DATA:  Recurrent most menopausal bleeding. EXAM: TRANSABDOMINAL AND TRANSVAGINAL ULTRASOUND OF PELVIS TECHNIQUE: Both transabdominal and transvaginal ultrasound examinations of the pelvis were performed. Transabdominal technique was performed for global imaging of the pelvis including uterus, ovaries, adnexal regions, and pelvic cul-de-sac. It was necessary to proceed with endovaginal exam following the transabdominal exam to visualize the uterus and adnexa. COMPARISON:  Pelvic ultrasound dated 06/02/2020. FINDINGS: Uterus Measurements: 6.8 x 4.5 x 4.2 cm = volume: 68 mL. A mass in the right anterior aspect of the uterine body measures 1.4 x 1.5 x 0.9 cm and is most consistent with a uterine fibroid. Endometrium Thickness: 1 mm.  No focal abnormality visualized. Right ovary Measurements: 2.4 x 1.2 x 1.2 cm = volume: 2 mL. Normal appearance/no adnexal mass. Left ovary Measurements: 2.9 x 1.8 x 2.8 cm = volume: 8 mL. A left ovarian cyst measures 1.9 x 2.9 x 1.5 cm. No imaging follow-up is recommended for this finding. Other findings No abnormal free fluid. IMPRESSION: Mass in the uterine body is most consistent with a uterine fibroid. No endometrial thickening. In the setting of post-menopausal bleeding, this is consistent with a benign etiology such as endometrial atrophy. If bleeding remains unresponsive to hormonal or medical therapy, sonohysterogram should be considered for focal lesion work-up. (Ref: Radiological Reasoning: Algorithmic Workup of Abnormal Vaginal Bleeding with Endovaginal Sonography and Sonohysterography. AJR 2008; 865:H84-69) Electronically Signed   By: Romona Curls M.D.   On: 11/02/2022 17:23   MR BRAIN WO CONTRAST  Result Date: 10/12/2022 CLINICAL DATA:  Stroke/TIA, determine embolic  source. Transient ischemic attack (TIA). Neuro deficit, acute, stroke suspected. Headache and numbness. Right-sided weakness. EXAM: MRI HEAD WITHOUT CONTRAST MRA HEAD WITHOUT CONTRAST TECHNIQUE: Multiplanar, multi-echo pulse sequences of the brain and surrounding structures were acquired without intravenous contrast. Angiographic images of the Circle of Willis were acquired using MRA technique without intravenous contrast. COMPARISON:  Head CT 10/12/2022.  Head MRI and MRA 12/13/2021. FINDINGS: MRI HEAD FINDINGS Brain: There is no evidence of an acute infarct, intracranial hemorrhage, mass, midline shift, or extra-axial fluid collection. T2 hyperintensities in the left greater than right cerebral white matter are unchanged from the prior MRI and are nonspecific but compatible with mild chronic small vessel ischemic disease. The ventricles and sulci are normal. Vascular: Major intracranial vascular flow voids are preserved. Skull and upper cervical spine: No suspicious marrow lesion. Sinuses/Orbits: Unremarkable orbits. Paranasal sinuses and mastoid air cells are clear. Other: None. MRA HEAD FINDINGS Anterior circulation: The internal carotid arteries are widely patent from skull base to carotid termini. ACAs and MCAs are patent without evidence of a proximal branch occlusion or significant proximal stenosis. No aneurysm is identified. Posterior circulation: The included portions of the intracranial vertebral arteries are widely patent to the basilar with the right being dominant. Patent AICA and SCA origins are visualized bilaterally. The basilar artery is widely patent. Posterior communicating arteries are diminutive or absent. Both PCAs are patent without evidence of a significant proximal stenosis. No aneurysm is identified. Anatomic variants: None. IMPRESSION: 1. No acute intracranial abnormality. 2. Mild chronic small vessel ischemic disease. 3. Negative head MRA. Electronically Signed   By: Sebastian Ache M.D.    On: 10/12/2022 13:06   MR ANGIO  HEAD WO CONTRAST  Result Date: 10/12/2022 CLINICAL DATA:  Stroke/TIA, determine embolic source. Transient ischemic attack (TIA). Neuro deficit, acute, stroke suspected. Headache and numbness. Right-sided weakness. EXAM: MRI HEAD WITHOUT CONTRAST MRA HEAD WITHOUT CONTRAST TECHNIQUE: Multiplanar, multi-echo pulse sequences of the brain and surrounding structures were acquired without intravenous contrast. Angiographic images of the Circle of Willis were acquired using MRA technique without intravenous contrast. COMPARISON:  Head CT 10/12/2022.  Head MRI and MRA 12/13/2021. FINDINGS: MRI HEAD FINDINGS Brain: There is no evidence of an acute infarct, intracranial hemorrhage, mass, midline shift, or extra-axial fluid collection. T2 hyperintensities in the left greater than right cerebral white matter are unchanged from the prior MRI and are nonspecific but compatible with mild chronic small vessel ischemic disease. The ventricles and sulci are normal. Vascular: Major intracranial vascular flow voids are preserved. Skull and upper cervical spine: No suspicious marrow lesion. Sinuses/Orbits: Unremarkable orbits. Paranasal sinuses and mastoid air cells are clear. Other: None. MRA HEAD FINDINGS Anterior circulation: The internal carotid arteries are widely patent from skull base to carotid termini. ACAs and MCAs are patent without evidence of a proximal branch occlusion or significant proximal stenosis. No aneurysm is identified. Posterior circulation: The included portions of the intracranial vertebral arteries are widely patent to the basilar with the right being dominant. Patent AICA and SCA origins are visualized bilaterally. The basilar artery is widely patent. Posterior communicating arteries are diminutive or absent. Both PCAs are patent without evidence of a significant proximal stenosis. No aneurysm is identified. Anatomic variants: None. IMPRESSION: 1. No acute intracranial  abnormality. 2. Mild chronic small vessel ischemic disease. 3. Negative head MRA. Electronically Signed   By: Sebastian Ache M.D.   On: 10/12/2022 13:06   CT HEAD CODE STROKE WO CONTRAST  Result Date: 10/12/2022 CLINICAL DATA:  Code stroke. Neuro deficit, acute, stroke suspected R sided weakness, aphasia EXAM: CT HEAD WITHOUT CONTRAST TECHNIQUE: Contiguous axial images were obtained from the base of the skull through the vertex without intravenous contrast. RADIATION DOSE REDUCTION: This exam was performed according to the departmental dose-optimization program which includes automated exposure control, adjustment of the mA and/or kV according to patient size and/or use of iterative reconstruction technique. COMPARISON:  CT head 12/13/2021. FINDINGS: Brain: No evidence of acute large vascular territory infarction, hemorrhage, hydrocephalus, extra-axial collection or mass lesion/mass effect. Vascular: No hyperdense vessel. Skull: No acute fracture. Sinuses/Orbits: Clear sinuses.  No acute orbital findings. Other: No mastoid effusions. ASPECTS [facility] Stroke Program Early CT Score) total score (0-10 with 10 being normal): 10. IMPRESSION: 1. No evidence of acute intracranial abnormality. 2. ASPECTS is 10. Code stroke imaging results were communicated on 10/12/2022 at 11:37 am to provider Dr. Selina Cooley Via secure text paging. Electronically Signed   By: Feliberto Harts M.D.   On: 10/12/2022 11:37   VAS US CAROTID  Result Date: 10/10/2022 Carotid Arterial Duplex Study Patient Name:  NAVYA TIMMONS  Date of Exam:   10/09/2022 Medical Rec #: [id number]      Accession #:    [id number] Date of Birth: [birth year]      Patient Gender: F Patient Age:   57 years Exam Location:  [facility] Procedure:      VAS US CAROTID Referring Phys: DEBBY CROSLEY --------------------------------------------------------------------------------  Indications:       Syncope. Risk Factors:      Past history of smoking. Comparison Study:   No prior studies. Performing Technologist: Jean Rosenthal RDMS, RVT  Examination Guidelines: A complete evaluation includes  B-mode imaging, spectral Doppler, color Doppler, and power Doppler as needed of all accessible portions of each vessel. Bilateral testing is considered an integral part of a complete examination. Limited examinations for reoccurring indications may be performed as noted.  Right Carotid Findings: +----------+--------+--------+--------+------------------+------------------+           PSV cm/sEDV cm/sStenosisPlaque DescriptionComments           +----------+--------+--------+--------+------------------+------------------+ CCA Prox  123     30                                                   +----------+--------+--------+--------+------------------+------------------+ CCA Distal120     31                                intimal thickening +----------+--------+--------+--------+------------------+------------------+ ICA Prox  99      31                                                   +----------+--------+--------+--------+------------------+------------------+ ICA Distal119     41                                                   +----------+--------+--------+--------+------------------+------------------+ ECA       86      23                                                   +----------+--------+--------+--------+------------------+------------------+ +----------+--------+-------+----------------+-------------------+           PSV cm/sEDV cmsDescribe        Arm Pressure (mmHG) +----------+--------+-------+----------------+-------------------+ [id number]            Multiphasic, WNL                    +----------+--------+-------+----------------+-------------------+ +---------+--------+--+--------+--+---------+ VertebralPSV cm/s87EDV cm/s35Antegrade +---------+--------+--+--------+--+---------+  Left Carotid Findings:  +----------+--------+--------+--------+------------------+------------------+           PSV cm/sEDV cm/sStenosisPlaque DescriptionComments           +----------+--------+--------+--------+------------------+------------------+ CCA Prox  126     28                                                   +----------+--------+--------+--------+------------------+------------------+ CCA Distal106     29                                intimal thickening +----------+--------+--------+--------+------------------+------------------+ ICA Prox  112     37                                                   +----------+--------+--------+--------+------------------+------------------+  ICA Distal102     41                                                   +----------+--------+--------+--------+------------------+------------------+ ECA       97      16                                                   +----------+--------+--------+--------+------------------+------------------+ +----------+--------+--------+----------------+-------------------+           PSV cm/sEDV cm/sDescribe        Arm Pressure (mmHG) +----------+--------+--------+----------------+-------------------+ [id number]             Multiphasic, WNL                    +----------+--------+--------+----------------+-------------------+ +---------+--------+--+--------+--+---------+ VertebralPSV cm/s94EDV cm/s25Antegrade +---------+--------+--+--------+--+---------+   Summary: Right Carotid: The extracranial vessels were near-normal with only minimal wall                thickening or plaque. Left Carotid: The extracranial vessels were near-normal with only minimal wall               thickening or plaque. Vertebrals:  Bilateral vertebral arteries demonstrate antegrade flow. Subclavians: Normal flow hemodynamics were seen in bilateral subclavian              arteries. *See table(s) above for measurements and  observations.  Electronically signed by Sherald Hess MD on 10/10/2022 at 9:15:00 AM.    Final    ECHOCARDIOGRAM COMPLETE  Result Date: 10/09/2022    ECHOCARDIOGRAM REPORT   Patient Name:   AARIANA SHANKLAND Date of Exam: 10/09/2022 Medical Rec #:  [id number]     Height:       69.0 in Accession #:    [id number]    Weight:       155.2 lb Date of Birth:  1960-09-27     BSA:          1.855 m Patient Age:    61 years      BP:           107/68 mmHg Patient Gender: F             HR:           77 bpm. Exam Location:  Inpatient Procedure: 2D Echo, Cardiac Doppler and Color Doppler Indications:    syncope  History:        Patient has no prior history of Echocardiogram examinations.                 Risk Factors:Hypertension.  Sonographer:    Mike Gip Referring Phys: [phone] DEBBY CROSLEY IMPRESSIONS  1. Left ventricular ejection fraction, by estimation, is 60 to 65%. The left ventricle has normal function. The left ventricle has no regional wall motion abnormalities. Left ventricular diastolic parameters were normal.  2. Right ventricular systolic function is normal. The right ventricular size is normal. There is normal pulmonary artery systolic pressure.  3. The mitral valve is normal in structure. No evidence of mitral valve regurgitation.  4. The aortic valve is normal in structure. Aortic valve regurgitation is not visualized.  5. The inferior vena cava is normal in size with greater  than 50% respiratory variability, suggesting right atrial pressure of 3 mmHg. Conclusion(s)/Recommendation(s): Normal biventricular function without evidence of hemodynamically significant valvular heart disease. FINDINGS  Left Ventricle: Left ventricular ejection fraction, by estimation, is 60 to 65%. The left ventricle has normal function. The left ventricle has no regional wall motion abnormalities. The left ventricular internal cavity size was normal in size. There is  no left ventricular hypertrophy. Left ventricular diastolic  parameters were normal. Right Ventricle: The right ventricular size is normal. Right ventricular systolic function is normal. There is normal pulmonary artery systolic pressure. The tricuspid regurgitant velocity is 2.71 m/s, and with an assumed right atrial pressure of 3 mmHg,  the estimated right ventricular systolic pressure is 32.4 mmHg. Left Atrium: Left atrial size was normal in size. Right Atrium: Right atrial size was normal in size. Pericardium: There is no evidence of pericardial effusion. Mitral Valve: The mitral valve is normal in structure. No evidence of mitral valve regurgitation. Tricuspid Valve: The tricuspid valve is normal in structure. Tricuspid valve regurgitation is mild. Aortic Valve: The aortic valve is normal in structure. Aortic valve regurgitation is not visualized. Pulmonic Valve: Pulmonic valve regurgitation is not visualized. Aorta: The aortic root and ascending aorta are structurally normal, with no evidence of dilitation. Venous: The inferior vena cava is normal in size with greater than 50% respiratory variability, suggesting right atrial pressure of 3 mmHg. IAS/Shunts: The interatrial septum was not well visualized.  LEFT VENTRICLE PLAX 2D LVIDd:         4.60 cm     Diastology LVIDs:         2.70 cm     LV e' medial:    10.20 cm/s LV PW:         1.00 cm     LV E/e' medial:  8.4 LV IVS:        1.00 cm     LV e' lateral:   14.00 cm/s LVOT diam:     1.80 cm     LV E/e' lateral: 6.1 LV SV:         68 LV SV Index:   36 LVOT Area:     2.54 cm  LV Volumes (MOD) LV vol d, MOD A2C: 83.9 ml LV vol d, MOD A4C: 71.5 ml LV vol s, MOD A2C: 24.8 ml LV vol s, MOD A4C: 28.2 ml LV SV MOD A2C:     59.1 ml LV SV MOD A4C:     71.5 ml LV SV MOD BP:      51.4 ml RIGHT VENTRICLE             IVC RV Basal diam:  3.90 cm     IVC diam: 2.00 cm RV S prime:     15.00 cm/s TAPSE (M-mode): 2.4 cm LEFT ATRIUM             Index        RIGHT ATRIUM           Index LA diam:        3.50 cm 1.89 cm/m   RA Area:      16.20 cm LA Vol (A2C):   42.0 ml 22.64 ml/m  RA Volume:   39.50 ml  21.29 ml/m LA Vol (A4C):   39.0 ml 21.03 ml/m LA Biplane Vol: 43.3 ml 23.34 ml/m  AORTIC VALVE LVOT Vmax:   138.00 cm/s LVOT Vmean:  88.800 cm/s LVOT VTI:    0.266 m  AORTA Ao Root diam:  3.10 cm Ao Asc diam:  2.80 cm MITRAL VALVE               TRICUSPID VALVE MV Area (PHT): 3.43 cm    TR Peak grad:   29.4 mmHg MV Decel Time: 221 msec    TR Vmax:        271.00 cm/s MV E velocity: 86.10 cm/s MV A velocity: 88.30 cm/s  SHUNTS MV E/A ratio:  0.98        Systemic VTI:  0.27 m                            Systemic Diam: 1.80 cm Carolan Clines Electronically signed by Carolan Clines Signature Date/Time: 10/09/2022/10:50:47 AM    Final    DG Chest Port 1 View  Result Date: 10/08/2022 CLINICAL DATA:  Syncope EXAM: PORTABLE CHEST 1 VIEW COMPARISON:  Chest radiographs 12/13/2021 FINDINGS: Stable cardiomediastinal silhouette. No focal consolidation, pleural effusion, or pneumothorax. No displaced rib fractures. IMPRESSION: No active disease. Electronically Signed   By: Minerva Fester M.D.   On: 10/08/2022 23:09    Recent Results (from the past 2160 hour(s))  CBC with Differential     Status: Abnormal   Collection Time: 10/08/22 10:39 PM  Result Value Ref Range   WBC 7.4 4.0 - 10.5 K/uL   RBC 3.04 (L) 3.87 - 5.11 MIL/uL   Hemoglobin 8.9 (L) 12.0 - 15.0 g/dL   HCT 16.1 (L) 09.6 - 04.5 %   MCV 84.2 80.0 - 100.0 fL   MCH 29.3 26.0 - 34.0 pg   MCHC 34.8 30.0 - 36.0 g/dL   RDW 40.9 81.1 - 91.4 %   Platelets 218 150 - 400 K/uL   nRBC 0.0 0.0 - 0.2 %   Neutrophils Relative % 42 %   Neutro Abs 3.1 1.7 - 7.7 K/uL   Lymphocytes Relative 47 %   Lymphs Abs 3.5 0.7 - 4.0 K/uL   Monocytes Relative 10 %   Monocytes Absolute 0.8 0.1 - 1.0 K/uL   Eosinophils Relative 1 %   Eosinophils Absolute 0.0 0.0 - 0.5 K/uL   Basophils Relative 0 %   Basophils Absolute 0.0 0.0 - 0.1 K/uL   Immature Granulocytes 0 %   Abs Immature Granulocytes 0.01 0.00 - 0.07  K/uL    Comment: Performed at [facility], [address] [place], [place] [address]  Magnesium     Status: None   Collection Time: 10/08/22 10:39 PM  Result Value Ref Range   Magnesium 2.2 1.7 - 2.4 mg/dL    Comment: Performed at [facility], [address], [place], [place] [address]  Comprehensive metabolic panel     Status: Abnormal   Collection Time: 10/08/22 10:39 PM  Result Value Ref Range   Sodium 138 135 - 145 mmol/L   Potassium 2.7 (LL) 3.5 - 5.1 mmol/L    Comment: CRITICAL RESULT CALLED TO, READ BACK BY AND VERIFIED WITH BONIS,G AT 2337 ON 10/08/22 BY LUZOLOP    Chloride 110 98 - 111 mmol/L   CO2 21 (L) 22 - 32 mmol/L   Glucose, Bld 89 70 - 99 mg/dL    Comment: Glucose reference range applies only to samples taken after fasting for at least 8 hours.   BUN 17 6 - 20 mg/dL   Creatinine, Ser 8.65 0.44 - 1.00 mg/dL   Calcium 7.8 (L) 8.9 - 10.3 mg/dL   Total Protein  6.2 (L) 6.5 - 8.1 g/dL   Albumin 3.2 (L) 3.5 - 5.0 g/dL   AST 25 15 - 41 U/L   ALT 17 0 - 44 U/L   Alkaline Phosphatase 43 38 - 126 U/L   Total Bilirubin 0.2 (L) 0.3 - 1.2 mg/dL   GFR, Estimated >30 >86 mL/min    Comment: (NOTE) Calculated using the CKD-EPI Creatinine Equation (2021)    Anion gap 7 5 - 15    Comment: Performed at [facility], [address] [place], [place] [address]  Ethanol     Status: Abnormal   Collection Time: 10/08/22 10:39 PM  Result Value Ref Range   Alcohol, Ethyl (B) 14 (H) <10 mg/dL    Comment: (NOTE) Lowest detectable limit for serum alcohol is 10 mg/dL.  For medical purposes only. Performed at [facility], [address], [place], [place] [address]   SARS Coronavirus 2 by RT PCR (hospital order, performed in [facility] hospital lab) *cepheid single result test* Anterior Nasal Swab     Status: None   Collection Time: 10/09/22  2:30 AM   Specimen: Anterior Nasal Swab  Result Value Ref Range    SARS Coronavirus 2 by RT PCR NEGATIVE NEGATIVE    Comment: (NOTE) SARS-CoV-2 target nucleic acids are NOT DETECTED.  The SARS-CoV-2 RNA is generally detectable in upper and lower respiratory specimens during the acute phase of infection. The lowest concentration of SARS-CoV-2 viral copies this assay can detect is 250 copies / mL. A negative result does not preclude SARS-CoV-2 infection and should not be used as the sole basis for treatment or other patient management decisions.  A negative result may occur with improper specimen collection / handling, submission of specimen other than nasopharyngeal swab, presence of viral mutation(s) within the areas targeted by this assay, and inadequate number of viral copies (<250 copies / mL). A negative result must be combined with clinical observations, patient history, and epidemiological information.  Fact Sheet for Patients:   RoadLapTop.co.za  Fact Sheet for Healthcare Providers: http://kim-miller.com/  This test is not yet approved or  cleared by the [place] FDA and has been authorized for detection and/or diagnosis of SARS-CoV-2 by FDA under an Emergency Use Authorization (EUA).  This EUA will remain in effect (meaning this test can be used) for the duration of the COVID-19 declaration under Section 564(b)(1) of the Act, 21 U.S.C. section 360bbb-3(b)(1), unless the authorization is terminated or revoked sooner.  Performed at [facility], [address], [place], [place] [address]   Basic metabolic panel     Status: Abnormal   Collection Time: 10/09/22  4:02 AM  Result Value Ref Range   Sodium 138 135 - 145 mmol/L   Potassium 4.0 3.5 - 5.1 mmol/L   Chloride 107 98 - 111 mmol/L   CO2 24 22 - 32 mmol/L   Glucose, Bld 107 (H) 70 - 99 mg/dL    Comment: Glucose reference range applies only to samples taken after fasting for at least 8 hours.   BUN 16 6 - 20 mg/dL    Creatinine, Ser 2.44 0.44 - 1.00 mg/dL   Calcium 8.2 (L) 8.9 - 10.3 mg/dL   GFR, Estimated >01 >02 mL/min    Comment: (NOTE) Calculated using the CKD-EPI Creatinine Equation (2021)    Anion gap 7 5 - 15    Comment: Performed at [facility], [address] [place], [place] [address]  CBC with  Differential/Platelet     Status: Abnormal   Collection Time: 10/09/22  4:02 AM  Result Value Ref Range   WBC 6.7 4.0 - 10.5 K/uL   RBC 3.02 (L) 3.87 - 5.11 MIL/uL   Hemoglobin 9.1 (L) 12.0 - 15.0 g/dL   HCT 44.0 (L) 10.2 - 72.5 %   MCV 84.8 80.0 - 100.0 fL   MCH 30.1 26.0 - 34.0 pg   MCHC 35.5 30.0 - 36.0 g/dL   RDW 36.6 44.0 - 34.7 %   Platelets 229 150 - 400 K/uL   nRBC 0.0 0.0 - 0.2 %   Neutrophils Relative % 54 %   Neutro Abs 3.6 1.7 - 7.7 K/uL   Lymphocytes Relative 37 %   Lymphs Abs 2.5 0.7 - 4.0 K/uL   Monocytes Relative 9 %   Monocytes Absolute 0.6 0.1 - 1.0 K/uL   Eosinophils Relative 0 %   Eosinophils Absolute 0.0 0.0 - 0.5 K/uL   Basophils Relative 0 %   Basophils Absolute 0.0 0.0 - 0.1 K/uL   Immature Granulocytes 0 %   Abs Immature Granulocytes 0.02 0.00 - 0.07 K/uL    Comment: Performed at [facility], [address] [place], [place] [address]  Vitamin B12     Status: None   Collection Time: 10/09/22  4:02 AM  Result Value Ref Range   Vitamin B-12 311 180 - 914 pg/mL    Comment: (NOTE) This assay is not validated for testing neonatal or myeloproliferative syndrome specimens for Vitamin B12 levels. Performed at [facility], [address] [place], [place] [address]   Folate     Status: Abnormal   Collection Time: 10/09/22  4:02 AM  Result Value Ref Range   Folate 5.4 (L) >5.9 ng/mL    Comment: Performed at [facility], [address], [place], [place] [address]  Iron and TIBC     Status: None   Collection Time: 10/09/22  4:02 AM  Result Value Ref Range   Iron 59 28 - 170 ug/dL    TIBC 951 884 - 166 ug/dL   Saturation Ratios 18 10.4 - 31.8 %   UIBC 276 ug/dL    Comment: Performed at [facility], [address], [place], [place] [address]  Ferritin     Status: None   Collection Time: 10/09/22  4:02 AM  Result Value Ref Range   Ferritin 180 11 - 307 ng/mL    Comment: Performed at [facility], [address] [place], [place] [address]  Reticulocytes     Status: Abnormal   Collection Time: 10/09/22  4:02 AM  Result Value Ref Range   Retic Ct Pct 1.0 0.4 - 3.1 %   RBC. 3.29 (L) 3.87 - 5.11 MIL/uL   Retic Count, Absolute 32.9 19.0 - 186.0 K/uL   Immature Retic Fract 9.5 2.3 - 15.9 %    Comment: Performed at [facility], [address] [place], [place] [address]  ECHOCARDIOGRAM COMPLETE     Status: None   Collection Time: 10/09/22 10:16 AM  Result Value Ref Range   Weight 2,483.26 oz   Height 69 in   BP 107/68 mmHg   Single Plane A2C EF 70.4 %   Single Plane A4C EF 60.6 %   Calc EF 65.4 %   S' Lateral 2.70 cm   Area-P 1/2 3.43 cm2   Est EF 60 - 65%   Rapid urine drug screen (  hospital performed)     Status: Abnormal   Collection Time: 10/09/22  6:51 PM  Result Value Ref Range   Opiates NONE DETECTED NONE DETECTED   Cocaine NONE DETECTED NONE DETECTED   Benzodiazepines NONE DETECTED NONE DETECTED   Amphetamines NONE DETECTED NONE DETECTED   Tetrahydrocannabinol POSITIVE (A) NONE DETECTED   Barbiturates NONE DETECTED NONE DETECTED    Comment: (NOTE) DRUG SCREEN FOR MEDICAL PURPOSES ONLY.  IF CONFIRMATION IS NEEDED FOR ANY PURPOSE, NOTIFY LAB WITHIN 5 DAYS.  LOWEST DETECTABLE LIMITS FOR URINE DRUG SCREEN Drug Class                     Cutoff (ng/mL) Amphetamine and metabolites    1000 Barbiturate and metabolites    200 Benzodiazepine                 200 Opiates and metabolites        300 Cocaine and metabolites        300 THC                            50 Performed at [facility], [address], [place], [place] [address]   CBG monitoring, ED     Status: None   Collection Time: 10/12/22 11:22 AM  Result Value Ref Range   Glucose-Capillary 93 70 - 99 mg/dL    Comment: Glucose reference range applies only to samples taken after fasting for at least 8 hours.  Protime-INR     Status: None   Collection Time: 10/12/22 11:23 AM  Result Value Ref Range   Prothrombin Time 13.4 11.4 - 15.2 seconds   INR 1.0 0.8 - 1.2    Comment: (NOTE) INR goal varies based on device and disease states. Performed at [facility], [address] [place], [place] [address]   APTT     Status: None   Collection Time: 10/12/22 11:23 AM  Result Value Ref Range   aPTT 29 24 - 36 seconds    Comment: Performed at [facility], [address], [place], [place] [address]  CBC     Status: Abnormal   Collection Time: 10/12/22 11:23 AM  Result Value Ref Range   WBC 7.4 4.0 - 10.5 K/uL   RBC 3.75 (L) 3.87 - 5.11 MIL/uL   Hemoglobin 10.5 (L) 12.0 - 15.0 g/dL   HCT 91.4 (L) 78.2 - 95.6 %   MCV 80.5 80.0 - 100.0 fL   MCH 28.0 26.0 - 34.0 pg   MCHC 34.8 30.0 - 36.0 g/dL   RDW 21.3 08.6 - 57.8 %   Platelets 260 150 - 400 K/uL   nRBC 0.0 0.0 - 0.2 %    Comment: Performed at [facility], [address] [place], [place] [address]  Differential     Status: None   Collection Time: 10/12/22 11:23 AM  Result Value Ref Range   Neutrophils Relative % 46 %   Neutro Abs 3.5 1.7 - 7.7 K/uL   Lymphocytes Relative 46 %   Lymphs Abs 3.4 0.7 - 4.0 K/uL   Monocytes Relative 7 %   Monocytes Absolute 0.5 0.1 - 1.0 K/uL   Eosinophils Relative 0 %   Eosinophils Absolute 0.0 0.0 - 0.5 K/uL   Basophils Relative 1 %   Basophils Absolute 0.0 0.0 - 0.1 K/uL   Immature Granulocytes 0 %   Abs  Immature Granulocytes 0.01 0.00 - 0.07 K/uL    Comment: Performed at [facility], [address], [place], [place] [address]  Comprehensive metabolic panel     Status: Abnormal    Collection Time: 10/12/22 11:23 AM  Result Value Ref Range   Sodium 134 (L) 135 - 145 mmol/L   Potassium 3.6 3.5 - 5.1 mmol/L   Chloride 103 98 - 111 mmol/L   CO2 21 (L) 22 - 32 mmol/L   Glucose, Bld 98 70 - 99 mg/dL    Comment: Glucose reference range applies only to samples taken after fasting for at least 8 hours.   BUN 12 8 - 23 mg/dL   Creatinine, Ser 4.40 (H) 0.44 - 1.00 mg/dL   Calcium 8.6 (L) 8.9 - 10.3 mg/dL   Total Protein 7.0 6.5 - 8.1 g/dL   Albumin 3.6 3.5 - 5.0 g/dL   AST 23 15 - 41 U/L   ALT 16 0 - 44 U/L   Alkaline Phosphatase 54 38 - 126 U/L   Total Bilirubin 0.7 0.3 - 1.2 mg/dL   GFR, Estimated >10 >27 mL/min    Comment: (NOTE) Calculated using the CKD-EPI Creatinine Equation (2021)    Anion gap 10 5 - 15    Comment: Performed at [facility], [address] [place], [place] [address]  Ethanol     Status: None   Collection Time: 10/12/22 11:23 AM  Result Value Ref Range   Alcohol, Ethyl (B) <10 <10 mg/dL    Comment: (NOTE) Lowest detectable limit for serum alcohol is 10 mg/dL.  For medical purposes only. Performed at [facility], [address], [place], [place] [address]   I-stat chem 8, ED     Status: Abnormal   Collection Time: 10/12/22 11:26 AM  Result Value Ref Range   Sodium 137 135 - 145 mmol/L   Potassium 3.8 3.5 - 5.1 mmol/L   Chloride 104 98 - 111 mmol/L   BUN 14 8 - 23 mg/dL   Creatinine, Ser 7.42 0.44 - 1.00 mg/dL   Glucose, Bld 96 70 - 99 mg/dL    Comment: Glucose reference range applies only to samples taken after fasting for at least 8 hours.   Calcium, Ion 1.11 (L) 1.15 - 1.40 mmol/L   TCO2 24 22 - 32 mmol/L   Hemoglobin 10.9 (L) 12.0 - 15.0 g/dL   HCT 59.5 (L) 63.8 - 75.6 %  Estradiol     Status: None   Collection Time: 10/31/22 11:07 AM  Result Value Ref Range   Estradiol <5.0 0.0 - 54.7 pg/mL    Comment:                      Adult Female             Range                       Follicular phase     12.5 - 166.0                        Ovulation phase      85.8 - 498.0                       Luteal phase         43.8 - 211.0  Postmenopausal       <6.0 -  54.7                      Pregnancy                       1st trimester     215.0 - >4300.0 Roche ECLIA methodology   FSH     Status: None   Collection Time: 10/31/22 11:07 AM  Result Value Ref Range   FSH 100.0 25.8 - 134.8 mIU/mL    Comment:                      Adult Female             Range                       Follicular phase      3.5 -  12.5                       Ovulation phase       4.7 -  21.5                       Luteal phase          1.7 -   7.7                       Postmenopausal       25.8 - 134.8   Surgical pathology     Status: None   Collection Time: 12/05/22 11:02 AM  Result Value Ref Range   SURGICAL PATHOLOGY      SURGICAL PATHOLOGY CASE: [id number] PATIENT: Selinda Eon Surgical Pathology Report     Clinical History: AUB (cm)     FINAL MICROSCOPIC DIAGNOSIS:  A. ENDOMETRIUM, BIOPSY: - Benign inactive endometrium - Negative for hyperplasia or malignancy      GROSS DESCRIPTION:  Received in formalin are tan, hemorrhagic soft tissue fragments that are entirely submitted. Volume: 0.9 x 0.5 x 0.2 cm. (1 B)  (KW, 12/05/2022)    Final Diagnosis performed by Holley Bouche, MD.   Electronically signed 12/06/2022 Technical component performed at [employer], [address], [place], [place] [address].  Professional component performed at [facility], [address] [place], [place] [address].  Immunohistochemistry Technical component (if applicable) was performed at [facility]. [address], [place], [place] [address].   IMMUNOHISTOCHEMISTRY DISCLAIMER (if applicable): Some of these immunoh istochemical stains may have been developed and the performance characteristics determine by [facility].  Some may not have been cleared or approved by the U.S. Food and Drug Administration. The FDA has determined that such clearance or approval is not necessary. This test is used for clinical purposes. It should not be regarded as investigational or for research. This laboratory is certified under the Clinical Laboratory Improvement Amendments of 1988 (CLIA-88) as qualified to perform high complexity clinical laboratory testing.  The controls stained appropriately.   IHC stains are performed on formalin fixed, paraffin embedded tissue using a 3,3"diaminobenzidine (DAB) chromogen and Leica Bond Autostainer System. The staining intensity of the nucleus is score manually and is reported as the percentage of tumor cell nuclei demonstrating specific nuclear staining. The specimens are fixed in 10% Neutral Formalin for at least 6 hours and  up to 72hrs. These tests  are validated on decalcified tissue. Results should be interpreted with caution given the possibility of false negative results on decalcified specimens. Antibody Clones are as follows ER-clone 78F, PR-clone 16, Ki67- clone MM1. Some of these immunohistochemical stains may have been developed and the performance characteristics determined by [facility].         Garner Nash, MD, MS

## 2022-12-08 NOTE — Assessment & Plan Note (Signed)
Cardiologist recommended a cardiac stress test. Patient instructed to follow up on scheduling the test.

## 2022-12-08 NOTE — Assessment & Plan Note (Signed)
Continue with current management plan. Patient to monitor symptoms and ensuring warm extremities.

## 2022-12-08 NOTE — Addendum Note (Signed)
Addended by: Garnette Gunner on: 12/08/2022 02:35 PM   Modules accepted: Orders

## 2022-12-08 NOTE — Assessment & Plan Note (Signed)
Persistent low blood pressure readings at home. Discontinue HCTZ due to its diuretic effect contributing to low potassium and low BP. Monitor blood pressure and potassium levels closely.

## 2022-12-14 ENCOUNTER — Other Ambulatory Visit: Payer: Self-pay | Admitting: Family Medicine

## 2022-12-14 DIAGNOSIS — J302 Other seasonal allergic rhinitis: Secondary | ICD-10-CM

## 2022-12-14 NOTE — Telephone Encounter (Signed)
Chart supports rx. Last OV: 12/08/2022

## 2022-12-23 ENCOUNTER — Encounter (HOSPITAL_COMMUNITY): Payer: Self-pay

## 2022-12-26 ENCOUNTER — Ambulatory Visit: Admission: RE | Admit: 2022-12-26 | Payer: 59 | Source: Ambulatory Visit

## 2022-12-26 DIAGNOSIS — R200 Anesthesia of skin: Secondary | ICD-10-CM

## 2022-12-26 DIAGNOSIS — R531 Weakness: Secondary | ICD-10-CM

## 2022-12-26 MED ORDER — GADOPICLENOL 0.5 MMOL/ML IV SOLN
7.0000 mL | Freq: Once | INTRAVENOUS | Status: AC | PRN
  Administered 2022-12-26: 7 mL via INTRAVENOUS

## 2022-12-27 ENCOUNTER — Other Ambulatory Visit: Payer: Self-pay | Admitting: Family Medicine

## 2022-12-27 ENCOUNTER — Encounter (HOSPITAL_COMMUNITY)
Admission: RE | Admit: 2022-12-27 | Discharge: 2022-12-27 | Disposition: A | Discharge status: Home / Self Care | Payer: 59 | Source: Ambulatory Visit | Attending: Nurse Practitioner | Admitting: Nurse Practitioner

## 2022-12-27 DIAGNOSIS — R072 Precordial pain: Secondary | ICD-10-CM | POA: Diagnosis present

## 2022-12-27 DIAGNOSIS — M545 Low back pain, unspecified: Secondary | ICD-10-CM

## 2022-12-27 DIAGNOSIS — Z87898 Personal history of other specified conditions: Secondary | ICD-10-CM | POA: Diagnosis present

## 2022-12-27 LAB — NM PET CT CARDIAC PERFUSION MULTI W/ABSOLUTE BLOODFLOW
LV dias vol: 63 mL (ref 46–106)
LV sys vol: 24 mL
MBFR: 2.78
Nuc Rest EF: 62 %
Nuc Stress EF: 71 %
Rest MBF: 0.9 ml/g/min
Rest Nuclear Isotope Dose: 18.3 mCi
Rest perfusion cavity size (mL): 63 mL
ST Depression (mm): 0 mm
Stress MBF: 2.5 ml/g/min
Stress Nuclear Isotope Dose: 18.2 mCi
Stress perfusion cavity size (mL): 72 mL

## 2022-12-27 MED ORDER — REGADENOSON 0.4 MG/5ML IV SOLN
INTRAVENOUS | Status: AC
  Filled 2022-12-27: qty 5

## 2022-12-27 MED ORDER — RUBIDIUM RB82 GENERATOR (RUBYFILL)
18.2900 | PACK | Freq: Once | INTRAVENOUS | Status: AC
  Administered 2022-12-27: 18.29 via INTRAVENOUS

## 2022-12-27 MED ORDER — RUBIDIUM RB82 GENERATOR (RUBYFILL)
18.1600 | PACK | Freq: Once | INTRAVENOUS | Status: AC
  Administered 2022-12-27: 18.16 via INTRAVENOUS

## 2022-12-27 MED ORDER — REGADENOSON 0.4 MG/5ML IV SOLN
0.4000 mg | Freq: Once | INTRAVENOUS | Status: AC
  Administered 2022-12-27: 0.4 mg via INTRAVENOUS

## 2022-12-27 NOTE — Progress Notes (Signed)
Tolerated Cardiac PET well

## 2022-12-28 ENCOUNTER — Telehealth: Payer: 59 | Admitting: Physician Assistant

## 2022-12-28 ENCOUNTER — Other Ambulatory Visit: Payer: Self-pay

## 2022-12-28 ENCOUNTER — Ambulatory Visit (INDEPENDENT_AMBULATORY_CARE_PROVIDER_SITE_OTHER): Payer: 59 | Admitting: Internal Medicine

## 2022-12-28 ENCOUNTER — Encounter: Payer: Self-pay | Admitting: Internal Medicine

## 2022-12-28 VITALS — BP 128/80 | HR 83 | Temp 98.6°F | Ht 68.0 in | Wt 150.6 lb

## 2022-12-28 DIAGNOSIS — B349 Viral infection, unspecified: Secondary | ICD-10-CM

## 2022-12-28 DIAGNOSIS — K219 Gastro-esophageal reflux disease without esophagitis: Secondary | ICD-10-CM

## 2022-12-28 DIAGNOSIS — M545 Low back pain, unspecified: Secondary | ICD-10-CM

## 2022-12-28 DIAGNOSIS — U071 COVID-19: Secondary | ICD-10-CM

## 2022-12-28 LAB — POC COVID19 BINAXNOW: SARS Coronavirus 2 Ag: NEGATIVE

## 2022-12-28 LAB — POCT RAPID STREP A (OFFICE): Rapid Strep A Screen: NEGATIVE

## 2022-12-28 LAB — POCT INFLUENZA A/B
Influenza A, POC: NEGATIVE
Influenza B, POC: NEGATIVE

## 2022-12-28 MED ORDER — CYCLOBENZAPRINE HCL 5 MG PO TABS
5.0000 mg | ORAL_TABLET | Freq: Every day | ORAL | 1 refills | Status: AC
Start: 2022-12-28 — End: ?

## 2022-12-28 MED ORDER — FAMOTIDINE 40 MG PO TABS: 40.0000 mg | ORAL_TABLET | Freq: Every day | ORAL | 1 refills | Status: DC

## 2022-12-28 MED ORDER — PANTOPRAZOLE SODIUM 40 MG PO TBEC: 40.0000 mg | DELAYED_RELEASE_TABLET | Freq: Every day | ORAL | 0 refills | Status: DC

## 2022-12-28 NOTE — Progress Notes (Signed)
  Thank you for the details you included in the comment boxes. Those details are very helpful in determining the best course of treatment for you and help [place] to provide the best care.Because of need for discussion of possible antivirals giving risk factors and higher fever, we recommend that you convert this visit to a video visit in order for the provider to better assess what is going on.  The provider will be able to give you a more accurate diagnosis and treatment plan if we can more freely discuss your symptoms and with the addition of a virtual examination.   If you convert to a video visit, we will bill your insurance (similar to an office visit) and you will not be charged for this e-Visit. You will be able to stay at home and speak with the first available [facility] advanced practice provider. The link to do a video visit is in the drop down Menu tab of your Welcome screen in MyChart.

## 2022-12-28 NOTE — Progress Notes (Signed)
[facility] [address] [place] [address] Dept: [phone] Dept Fax: [phone]  Acute Care Office Visit  Subjective:   Barbara [name] January 04, 1961 12/28/2022  Chief Complaint  Patient presents with   Fever    Body aches, stomach aches, nausea Started Monday  taking tylenol pm    HPI: Barbara [name] is a 62 yo F who presents complaining of body aches, nausea, dry heaves, diarrhea, headache, sore throat onset 3 days ago. Patient stated that her brother recently tested positive for COVID with similar symptoms as patient. Patient did home COVID test 1 day ago which was positive, but test was expired.  Has been taking tylenol PM.  Did recently travel to [place] this past week.  Denies SHOB, CP.       The following portions of the patient's history were reviewed and updated as appropriate: past medical history, past surgical history, family history, social history, allergies, medications, and problem list.   Patient Active Problem List   Diagnosis Date Noted   Iron deficiency anemia due to chronic blood loss 12/08/2022   Tachycardia 12/08/2022   Diarrhea 10/19/2022   Syncope and collapse 10/09/2022   Hypokalemia 10/09/2022   Prolonged QT interval 10/09/2022   Raynaud's phenomenon without gangrene 07/11/2022   Lower back pain 03/28/2022   Onychomycosis 03/28/2022   Primary hypertension 12/23/2021   Abnormal uterine bleeding (AUB) 11/25/2021   GERD (gastroesophageal reflux disease) 08/27/2014   Past Medical History:  Diagnosis Date   Allergy 1965   Nuts, trees, grass, bees, red ants   Anemia 1980   Arthritis 2007   GERD (gastroesophageal reflux disease)    Hypertension    Low iron    Sickle cell trait (HCC)    Ulcer 1981   Viral gastroenteritis 07/24/2022   Past Surgical History:  Procedure Laterality Date   ANTERIOR CRUCIATE LIGAMENT REPAIR     CHOLECYSTECTOMY     ENDOMETRIAL ABLATION     LEFT HEART  CATHETERIZATION WITH CORONARY ANGIOGRAM N/A 08/27/2014   Procedure: LEFT HEART CATHETERIZATION WITH CORONARY ANGIOGRAM;  Surgeon: Iran Ouch, MD;  Location: [facility] CATH LAB;  Service: Cardiovascular;  Laterality: N/A;   TUBAL LIGATION  1980   Family History  Problem Relation Age of Onset   CAD Brother 54       CABG at age 53, and deceased   Heart disease Brother    Hypertension Brother    CAD Sister 82       1 stent   CAD Father 50       Deceased in 54s   Heart disease Father    Hypertension Father    Cancer Mother    Heart disease Brother    Hypertension Brother    Hypertension Daughter    Hypertension Maternal Aunt    Stroke Maternal Aunt    Hypertension Paternal Uncle    Outpatient Medications Prior to Visit  Medication Sig Dispense Refill   amLODipine (NORVASC) 5 MG tablet TAKE 1 TABLET (5 MG TOTAL) BY MOUTH DAILY. 90 tablet 0   diclofenac Sodium (VOLTAREN) 1 % GEL APPLY 4 G TOPICALLY 4 (FOUR) TIMES DAILY AS NEEDED. 100 g 3   dicyclomine (BENTYL) 20 MG tablet Take 1 tablet (20 mg total) by mouth in the morning and at bedtime. 20 tablet 0   lisinopril (ZESTRIL) 10 MG tablet Take 1 tablet (10 mg total) by mouth daily.     montelukast (SINGULAIR) 10 MG tablet TAKE 1 TABLET BY  MOUTH EVERYDAY AT BEDTIME 90 tablet 0   rosuvastatin (CRESTOR) 10 MG tablet Take 1 tablet (10 mg total) by mouth daily. 90 tablet 3   cyclobenzaprine (FLEXERIL) 5 MG tablet TAKE 1-2 TABLETS (5-10 MG TOTAL) BY MOUTH AT BEDTIME. 30 tablet 1   famotidine (PEPCID) 40 MG tablet Take 1 tablet (40 mg total) by mouth daily. 90 tablet 1   pantoprazole (PROTONIX) 40 MG tablet Take 40 mg by mouth daily.     No facility-administered medications prior to visit.   Allergies  Allergen Reactions   Iodinated Contrast Media Anaphylaxis    States he face swells when she has CT iodinated contrast   Iodine Swelling    Other reaction(s): Facial swelling   Morphine And Codeine Itching   Other Swelling    All Nut  Allergies   Sulfa Antibiotics Swelling   Sulfamethoxazole-Trimethoprim Other (See Comments)     ROS: A complete ROS was performed with pertinent positives/negatives noted in the HPI. The remainder of the ROS are negative.    Objective:   Today's Vitals   12/28/22 1457  BP: 128/80  Pulse: 83  Temp: 98.6 F (37 C)  TempSrc: Temporal  SpO2: 99%  Weight: 150 lb 9.6 oz (68.3 kg)  Height: 5\' 8"  (1.727 m)    GENERAL: Well-appearing, in NAD. Well nourished.  SKIN: Pink, warm and dry. No rash, lesion, ulceration, or ecchymoses.  HEENT:    HEAD: Normocephalic, non-traumatic.  EYES: Conjunctive pink without exudate. PERRL, EOMI.  EARS: External ear w/o redness, swelling, masses, or lesions. EAC clear. TM's intact, translucent w/o bulging, appropriate landmarks visualized.  NOSE: Septum midline w/o deformity. Nares patent, mucosa pink and non-inflamed w/o drainage. No sinus tenderness.  THROAT: Uvula midline. Oropharynx clear. Tonsils non-inflamed w/o exudate. Mucus membranes pink and moist.  NECK: Trachea midline. Full ROM w/o pain or tenderness. No lymphadenopathy.  RESPIRATORY: Chest wall symmetrical. Respirations even and non-labored. Breath sounds clear to auscultation bilaterally.  CARDIAC: S1, S2 present, regular rate and rhythm. Peripheral pulses 2+ bilaterally.  GI: Abdomen soft, non-tender. Normoactive bowel sounds. No rebound tenderness. No hepatomegaly or splenomegaly. No CVA tenderness.  EXTREMITIES: Without clubbing, cyanosis, or edema.  NEUROLOGIC: Steady, even gait.  PSYCH/MENTAL STATUS: Alert, oriented x 3. Cooperative, appropriate mood and affect.    Results for orders placed or performed in visit on 12/28/22  POC COVID-19 BinaxNow  Result Value Ref Range   SARS Coronavirus 2 Ag Negative Negative  POCT Influenza A/B  Result Value Ref Range   Influenza A, POC Negative Negative   Influenza B, POC Negative Negative  POCT rapid strep A  Result Value Ref Range    Rapid Strep A Screen Negative Negative      Assessment & Plan:  1. Viral infection - POC COVID-19 BinaxNow - POCT Influenza A/B - POCT rapid strep A Body aches , headache- tylenol as needed  Nausea/dry heaves / diarrhea - bland diet. Can take imodium as needed for diarrhea.  Sore throat - Salt water gargles , throat lozenges, chloroseptic spray  Rest, drink plenty of fluids  No orders of the defined types were placed in this encounter.  Lab Orders         POC COVID-19 BinaxNow         POCT Influenza A/B         POCT rapid strep A     No images are attached to the encounter or orders placed in the encounter.  Return if symptoms worsen or  fail to improve.   Of note, portions of this note may have been created with voice recognition software Physicist, medical). While this note has been edited for accuracy, occasional wrong-word or 'sound-a-like' substitutions may have occurred due to the inherent limitations of voice recognition software.  Salvatore Decent, FNP

## 2022-12-28 NOTE — Patient Instructions (Addendum)
Body aches , headache- tylenol as needed  Nausea/dry heaves / diarrhea - bland diet. Can take imodium as needed for diarrhea.  Sore throat - Salt water gargles , throat lozenges, chloroseptic spray  Rest, drink plenty of fluids

## 2023-01-02 ENCOUNTER — Encounter (HOSPITAL_COMMUNITY): Payer: Self-pay

## 2023-01-02 ENCOUNTER — Emergency Department (HOSPITAL_COMMUNITY): Payer: 59

## 2023-01-02 ENCOUNTER — Telehealth: Payer: Self-pay | Admitting: Family Medicine

## 2023-01-02 ENCOUNTER — Emergency Department (HOSPITAL_COMMUNITY)
Admission: EM | Admit: 2023-01-02 | Discharge: 2023-01-02 | Disposition: A | Discharge status: Home / Self Care | Payer: 59 | Attending: Emergency Medicine | Admitting: Emergency Medicine

## 2023-01-02 ENCOUNTER — Other Ambulatory Visit: Payer: Self-pay

## 2023-01-02 DIAGNOSIS — R197 Diarrhea, unspecified: Secondary | ICD-10-CM | POA: Diagnosis not present

## 2023-01-02 DIAGNOSIS — Z79899 Other long term (current) drug therapy: Secondary | ICD-10-CM | POA: Insufficient documentation

## 2023-01-02 DIAGNOSIS — R109 Unspecified abdominal pain: Secondary | ICD-10-CM | POA: Insufficient documentation

## 2023-01-02 DIAGNOSIS — R110 Nausea: Secondary | ICD-10-CM | POA: Insufficient documentation

## 2023-01-02 DIAGNOSIS — I10 Essential (primary) hypertension: Secondary | ICD-10-CM | POA: Diagnosis not present

## 2023-01-02 DIAGNOSIS — Z87442 Personal history of urinary calculi: Secondary | ICD-10-CM | POA: Diagnosis not present

## 2023-01-02 LAB — CBC WITH DIFFERENTIAL/PLATELET
Abs Immature Granulocytes: 0.01 10*3/uL (ref 0.00–0.07)
Basophils Absolute: 0 10*3/uL (ref 0.0–0.1)
Basophils Relative: 0 %
Eosinophils Absolute: 0 10*3/uL (ref 0.0–0.5)
Eosinophils Relative: 1 %
HCT: 28.8 % — ABNORMAL LOW (ref 36.0–46.0)
Hemoglobin: 10.5 g/dL — ABNORMAL LOW (ref 12.0–15.0)
Immature Granulocytes: 0 %
Lymphocytes Relative: 39 %
Lymphs Abs: 2.8 10*3/uL (ref 0.7–4.0)
MCH: 31.3 pg (ref 26.0–34.0)
MCHC: 36.5 g/dL — ABNORMAL HIGH (ref 30.0–36.0)
MCV: 85.7 fL (ref 80.0–100.0)
Monocytes Absolute: 0.6 10*3/uL (ref 0.1–1.0)
Monocytes Relative: 8 %
Neutro Abs: 3.6 10*3/uL (ref 1.7–7.7)
Neutrophils Relative %: 52 %
Platelets: 292 10*3/uL (ref 150–400)
RBC: 3.36 MIL/uL — ABNORMAL LOW (ref 3.87–5.11)
RDW: 15.7 % — ABNORMAL HIGH (ref 11.5–15.5)
WBC: 7.1 10*3/uL (ref 4.0–10.5)
nRBC: 0 % (ref 0.0–0.2)

## 2023-01-02 LAB — COMPREHENSIVE METABOLIC PANEL
ALT: 34 U/L (ref 0–44)
AST: 30 U/L (ref 15–41)
Albumin: 4.5 g/dL (ref 3.5–5.0)
Alkaline Phosphatase: 80 U/L (ref 38–126)
Anion gap: 14 (ref 5–15)
BUN: 19 mg/dL (ref 8–23)
CO2: 21 mmol/L — ABNORMAL LOW (ref 22–32)
Calcium: 9.6 mg/dL (ref 8.9–10.3)
Chloride: 103 mmol/L (ref 98–111)
Creatinine, Ser: 1.05 mg/dL — ABNORMAL HIGH (ref 0.44–1.00)
GFR, Estimated: >60 mL/min (ref >60)
Glucose, Bld: 89 mg/dL (ref 70–99)
Potassium: 3.7 mmol/L (ref 3.5–5.1)
Sodium: 138 mmol/L (ref 135–145)
Total Bilirubin: 0.3 mg/dL (ref 0.3–1.2)
Total Protein: 8 g/dL (ref 6.5–8.1)

## 2023-01-02 LAB — URINALYSIS, ROUTINE W REFLEX MICROSCOPIC
Bilirubin Urine: NEGATIVE
Glucose, UA: NEGATIVE mg/dL
Hgb urine dipstick: NEGATIVE
Ketones, ur: NEGATIVE mg/dL
Leukocytes,Ua: NEGATIVE
Nitrite: NEGATIVE
Protein, ur: NEGATIVE mg/dL
Specific Gravity, Urine: 1.018 (ref 1.005–1.030)
pH: 5 (ref 5.0–8.0)

## 2023-01-02 LAB — LIPASE, BLOOD: Lipase: 38 U/L (ref 11–51)

## 2023-01-02 MED ORDER — PREDNISONE 20 MG PO TABS: 40.0000 mg | ORAL_TABLET | Freq: Every day | ORAL | 0 refills | Status: DC

## 2023-01-02 MED ORDER — OXYCODONE-ACETAMINOPHEN 5-325 MG PO TABS: 1.0000 | ORAL_TABLET | Freq: Once | ORAL | Status: DC

## 2023-01-02 MED ORDER — HYDROMORPHONE HCL 1 MG/ML IJ SOLN
0.5000 mg | Freq: Once | INTRAMUSCULAR | Status: AC
  Administered 2023-01-02: 0.5 mg via INTRAVENOUS
  Filled 2023-01-02: qty 1

## 2023-01-02 MED ORDER — DEXAMETHASONE SODIUM PHOSPHATE 10 MG/ML IJ SOLN
10.0000 mg | Freq: Once | INTRAMUSCULAR | Status: AC
  Administered 2023-01-02: 10 mg via INTRAVENOUS
  Filled 2023-01-02: qty 1

## 2023-01-02 MED ORDER — LIDOCAINE 5 % EX PTCH
1.0000 | MEDICATED_PATCH | CUTANEOUS | Status: DC
  Administered 2023-01-02: 1 via TRANSDERMAL
  Filled 2023-01-02: qty 1

## 2023-01-02 MED ORDER — DIAZEPAM 5 MG/ML IJ SOLN
2.5000 mg | Freq: Once | INTRAMUSCULAR | Status: AC
  Administered 2023-01-02: 2.5 mg via INTRAVENOUS
  Filled 2023-01-02: qty 2

## 2023-01-02 NOTE — ED Provider Triage Note (Signed)
Emergency Medicine Provider Triage Evaluation Note  Barbara [name] , a 62 y.o. female was evaluated in triage.  Pt complains of right flank pain x 2 days. Notes that her flank pain radiates to her abdomen. Notes history of kidney stones and UTIs. Notes nausea, watery/soft stools. No meds tried PTA. Denies urinary symptoms.   Review of Systems  Positive:  Negative:   Physical Exam  BP (!) 147/78 (BP Location: Right Arm)   Pulse 78   Temp 98.5 F (36.9 C) (Oral)   Resp 17   Ht 5\' 8"  (1.727 m)   Wt 68.3 kg   LMP 10/09/2022   SpO2 98%   BMI 22.90 kg/m  Gen:   Awake, uncomfortable appearing Resp:  Normal effort  MSK:   Moves extremities without difficulty  Other:  Bilateral CVA TTP, R>L.   Medical Decision Making  Medically screening exam initiated at 2:32 PM.  Appropriate orders placed.  Barbara [name] was informed that the remainder of the evaluation will be completed by another provider, this initial triage assessment does not replace that evaluation, and the importance of remaining in the ED until their evaluation is complete.  Patient to remain in triage with IV pain medications ordered. Work up initiated   [employer], [employer], PA-C 01/02/23 1438

## 2023-01-02 NOTE — Telephone Encounter (Signed)
Pt scheduled an OV with Dr Janee Morn for 01/03/23 via mychart--My back or kidneys are hurting so bad. I called pt to clarify. She stated, she can not sit and the pain is so bad she gets dizzy at times. I transferred her over to nurse triage.

## 2023-01-02 NOTE — ED Triage Notes (Signed)
Pt arrived POV reporting flank pain radiating to abdomen and radiating towards leg. Also reports nausea. AAOX4, resp even and unlabored. States pain started this morning, nothing helping. Denies any other complaints

## 2023-01-02 NOTE — ED Provider Notes (Signed)
[place] EMERGENCY DEPARTMENT AT [facility] Provider Note   CSN: [id number] Arrival date & time: 01/02/23  1254     History  Chief Complaint  Patient presents with   Flank Pain    Barbara [name] is a 62 y.o. female with a past medical history of GERD, hypertension who presents emergency department with concerns for right flank pain x 2 days. Notes that her flank pain radiates to her abdomen. Notes history of kidney stones and UTIs. Notes nausea, watery/soft stools. No meds tried PTA. Denies urinary symptoms, bowel/bladder incontinence, numbness, tingling, saddle paresthesia.   The history is provided by the patient. No language interpreter was used.       Home Medications Prior to Admission medications   Medication Sig Start Date End Date Taking? Authorizing Provider  predniSONE (DELTASONE) 20 MG tablet Take 2 tablets (40 mg total) by mouth daily for 5 days. 01/02/23 01/07/23 Yes Andrey Mccaskill A, PA-C  amLODipine (NORVASC) 5 MG tablet TAKE 1 TABLET (5 MG TOTAL) BY MOUTH DAILY. 12/05/22 03/05/23  Garnette Gunner, MD  cyclobenzaprine (FLEXERIL) 5 MG tablet Take 1-2 tablets (5-10 mg total) by mouth at bedtime. 12/28/22   Garnette Gunner, MD  diclofenac Sodium (VOLTAREN) 1 % GEL APPLY 4 G TOPICALLY 4 (FOUR) TIMES DAILY AS NEEDED. 12/01/22   Garnette Gunner, MD  dicyclomine (BENTYL) 20 MG tablet Take 1 tablet (20 mg total) by mouth in the morning and at bedtime. 11/03/22   Garnette Gunner, MD  famotidine (PEPCID) 40 MG tablet Take 1 tablet (40 mg total) by mouth daily. 12/28/22 06/26/23  Garnette Gunner, MD  lisinopril (ZESTRIL) 10 MG tablet Take 1 tablet (10 mg total) by mouth daily. 12/08/22   Garnette Gunner, MD  montelukast (SINGULAIR) 10 MG tablet TAKE 1 TABLET BY MOUTH EVERYDAY AT BEDTIME 12/14/22   Garnette Gunner, MD  pantoprazole (PROTONIX) 40 MG tablet Take 1 tablet (40 mg total) by mouth daily. 12/28/22   Garnette Gunner, MD  rosuvastatin (CRESTOR) 10 MG tablet  Take 1 tablet (10 mg total) by mouth daily. 09/07/22   Garnette Gunner, MD      Allergies    Iodinated contrast media, Iodine, Morphine and codeine, Other, Sulfa antibiotics, and Sulfamethoxazole-trimethoprim    Review of Systems   Review of Systems  Genitourinary:  Positive for flank pain.  All other systems reviewed and are negative.   Physical Exam Updated Vital Signs BP 114/64   Pulse 78   Temp 98.4 F (36.9 C)   Resp 19   Ht 5\' 8"  (1.727 m)   Wt 68.3 kg   LMP 10/09/2022   SpO2 100%   BMI 22.90 kg/m  Physical Exam Vitals and nursing note reviewed.  Constitutional:      General: She is not in acute distress.    Appearance: She is not diaphoretic.  HENT:     Head: Normocephalic and atraumatic.     Mouth/Throat:     Pharynx: No oropharyngeal exudate.  Eyes:     General: No scleral icterus.    Conjunctiva/sclera: Conjunctivae normal.  Cardiovascular:     Rate and Rhythm: Normal rate and regular rhythm.     Pulses: Normal pulses.     Heart sounds: Normal heart sounds.  Pulmonary:     Effort: Pulmonary effort is normal. No respiratory distress.     Breath sounds: Normal breath sounds. No wheezing.  Abdominal:     General: Bowel sounds are normal.  Palpations: Abdomen is soft. There is no mass.     Tenderness: There is no abdominal tenderness. There is right CVA tenderness and left CVA tenderness. There is no guarding or rebound.     Comments: Bilateral CVA TTP R>L  Musculoskeletal:        General: Normal range of motion.     Cervical back: Normal range of motion and neck supple.  Skin:    General: Skin is warm and dry.  Neurological:     Mental Status: She is alert.  Psychiatric:        Behavior: Behavior normal.     ED Results / Procedures / Treatments   Labs (all labs ordered are listed, but only abnormal results are displayed) Labs Reviewed  URINALYSIS, ROUTINE W REFLEX MICROSCOPIC - Abnormal; Notable for the following components:      Result  Value   APPearance HAZY (*)    All other components within normal limits  CBC WITH DIFFERENTIAL/PLATELET - Abnormal; Notable for the following components:   RBC 3.36 (*)    Hemoglobin 10.5 (*)    HCT 28.8 (*)    MCHC 36.5 (*)    RDW 15.7 (*)    All other components within normal limits  COMPREHENSIVE METABOLIC PANEL - Abnormal; Notable for the following components:   CO2 21 (*)    Creatinine, Ser 1.05 (*)    All other components within normal limits  LIPASE, BLOOD  HCG, SERUM, QUALITATIVE    EKG EKG Interpretation Date/Time:  Monday January 02 2023 16:20:05 EDT Ventricular Rate:  75 PR Interval:  158 QRS Duration:  89 QT Interval:  440 QTC Calculation: 492 R Axis:   68  Text Interpretation: Sinus rhythm Borderline prolonged QT interval Confirmed by Vonita Moss [phone]) on 01/02/2023 4:45:34 PM  Radiology CT Renal Stone Study  Result Date: 01/02/2023 CLINICAL DATA:  Abdominal pain.  Concern for kidney stone. EXAM: CT ABDOMEN AND PELVIS WITHOUT CONTRAST TECHNIQUE: Multidetector CT imaging of the abdomen and pelvis was performed following the standard protocol without IV contrast. RADIATION DOSE REDUCTION: This exam was performed according to the departmental dose-optimization program which includes automated exposure control, adjustment of the mA and/or kV according to patient size and/or use of iterative reconstruction technique. COMPARISON:  CT abdomen pelvis dated 10/18/2021. FINDINGS: Evaluation of this exam is limited in the absence of intravenous contrast. Lower chest: The visualized lung bases are clear. No intra-abdominal free air or free fluid. Hepatobiliary: The liver is unremarkable. No biliary dilatation. Cholecystectomy. No retained calcified stone noted in the central CBD. Pancreas: Unremarkable. No pancreatic ductal dilatation or surrounding inflammatory changes. Spleen: Normal in size without focal abnormality. Adrenals/Urinary Tract: The adrenal glands are  unremarkable. The kidneys, visualized ureters, and urinary bladder appear unremarkable. Stomach/Bowel: There is no bowel obstruction or active inflammation. The appendix is normal. Vascular/Lymphatic: The abdominal aorta and IVC are unremarkable. No portal venous gas. There is no adenopathy. Reproductive: The uterus is grossly unremarkable. There is a 3 cm left ovarian or paraovarian cyst, stable since the prior CT. This was previously evaluated by ultrasound on 11/01/2022. Other: None Musculoskeletal: Osteopenia.  No acute osseous pathology. IMPRESSION: 1. No acute intra-abdominal or pelvic pathology. No hydronephrosis or nephrolithiasis. 2. Stable left ovarian or paraovarian cyst. Electronically Signed   By: Elgie Collard M.D.   On: 01/02/2023 15:58    Procedures Procedures    Medications Ordered in ED Medications  lidocaine (LIDODERM) 5 % 1 patch (1 patch Transdermal Patch Applied 01/02/23  1730)  HYDROmorphone (DILAUDID) injection 0.5 mg (0.5 mg Intravenous Given 01/02/23 1440)  diazepam (VALIUM) injection 2.5 mg (2.5 mg Intravenous Given 01/02/23 1734)  dexamethasone (DECADRON) injection 10 mg (10 mg Intravenous Given 01/02/23 1927)    ED Course/ Medical Decision Making/ A&P Clinical Course as of 01/02/23 2124  Mon Jan 02, 2023  1614 Pt re-evaluated and resting comfortably on stretcher. Discussed with patient lab and imaging findings. Discussed with patient treatment plan. Answered all available questions.  [SB]  1838 Pt re-evaluated and discussed with patient discharge treatment plan.  Answered all available questions.  Patient appears safe for discharge at this time. [SB]    Clinical Course User Index [SB] Kanisha Duba A, PA-C                                 Medical Decision Making Amount and/or Complexity of Data Reviewed Labs: ordered. Radiology: ordered.  Risk Prescription drug management.   Pt presents with concerns for flank pain onset 2 days.  No urinary symptoms.   History of kidney stones. Vital signs, patient afebrile. On exam, pt with bilateral CVA tenderness to palpation, right greater than left. No acute cardiovascular, respiratory, abdominal exam findings. Differential diagnosis includes pyelonephritis, nephrolithiasis, acute cystitis, lumbar radiculopathy.    Co morbidities that complicate the patient evaluation: GERD, hypertension  Labs:  I ordered, and personally interpreted labs.  The pertinent results include:   Urinalysis unremarkable CBC without leukocytosis and unremarkable CMP with slightly elevated creatinine at 1.05 otherwise unremarkable Lipase unremarkable   Imaging: I ordered imaging studies including CT renal stone study I independently visualized and interpreted imaging which showed:  1. No acute intra-abdominal or pelvic pathology. No hydronephrosis  or nephrolithiasis.  2. Stable left ovarian or paraovarian cyst.   I agree with the radiologist interpretation  Medications:  I ordered medication including Dilaudid, diazepam, Decadron, Lidoderm patch for symptom management Reevaluation of the patient after these medicines and interventions, I reevaluated the patient and found that they have improved I have reviewed the patients home medicines and have made adjustments as needed   Disposition: Presenting suspicious for flank pain likely in the setting of lumbar radiculopathy.  Patient without any bowel/bladder incontinence, saddle paresthesia, numbness, tingling.  No concerns at this time for pyelonephritis, nephrolithiasis, acute cystitis..  Attending evaluated patient and agrees with discharge treatment plan at this time.  After consideration of the diagnostic results and the patients response to treatment, I feel that the patient would benefit from Discharge home.  Offered prescription for Lidoderm patch, patient declines at this time.  Prescription for prednisone sent to patient's pharmacy.  Patient provided information for  orthopedics for follow-up.  Work note provided.  Supportive care measures and strict return precautions discussed with patient at bedside. Pt acknowledges and verbalizes understanding. Pt appears safe for discharge. Follow up as indicated in discharge paperwork.    This chart was dictated using voice recognition software, Dragon. Despite the best efforts of this provider to proofread and correct errors, errors may still occur which can change documentation meaning.   Final Clinical Impression(s) / ED Diagnoses Final diagnoses:  Flank pain    Rx / DC Orders ED Discharge Orders          Ordered    predniSONE (DELTASONE) 20 MG tablet  Daily        01/02/23 1939  Jennfier Abdulla A, PA-C 01/02/23 2125    Rondel Baton, MD 01/03/23 [phone]

## 2023-01-02 NOTE — Discharge Instructions (Addendum)
It was a pleasure taking care of you today!   Your CT scan did not show any acute abnormalities. Follow up with your primary care provider regarding todays ED visit. Attached is information for the on-call Orthopedist, call and set up a follow up appointment regarding todays ED visit. You are prescribed prednisone (steroid), take medications as prescribed.  You may apply heat to the affected area for up to 15 minutes at a time.  Ensure to place a barrier between your skin and the heat. Return to the Emergency Department if you are experiencing loss of bowel or bladder, increasing/worsening symptoms, fever, inability to walk.

## 2023-01-03 ENCOUNTER — Ambulatory Visit: Payer: 59 | Admitting: Family Medicine

## 2023-01-03 NOTE — Transitions of Care (Post Inpatient/ED Visit) (Signed)
@  IONGEXBM@  01/03/2023  Name: Barbara [name] MRN: [id number] DOB: 1960/08/10  Today's TOC FU Call Status: Today's TOC FU Call Status:: Successful TOC FU Call Completed TOC FU Call Complete Date: 01/03/23  Transition Care Management Follow-up Telephone Call Date of Discharge: 01/02/23 Discharge Facility: [facility]) Type of Discharge: Emergency Department Reason for ED Visit: Other: (Flank pain) How have you been since you were released from the hospital?: Better (not as bad as yesterday but still in pain) Any questions or concerns?: No  Items Reviewed: Did you receive and understand the discharge instructions provided?: Yes Medications obtained,verified, and reconciled?: Yes (Medications Reviewed) Any new allergies since your discharge?: No Dietary orders reviewed?: NA Do you have support at home?: Yes  Medications Reviewed Today: Medications Reviewed Today     Reviewed by Olga Coaster, CMA (Certified Medical Assistant) on 01/03/23 at 1441  Med List Status: <None>   Medication Order Taking? Sig Documenting Provider Last Dose Status Informant  amLODipine (NORVASC) 5 MG tablet [id number]  TAKE 1 TABLET (5 MG TOTAL) BY MOUTH DAILY. Garnette Gunner, MD  Active   cyclobenzaprine (FLEXERIL) 5 MG tablet [id number]  Take 1-2 tablets (5-10 mg total) by mouth at bedtime. Garnette Gunner, MD  Active   diclofenac Sodium (VOLTAREN) 1 % GEL [id number]  APPLY 4 G TOPICALLY 4 (FOUR) TIMES DAILY AS NEEDED. Garnette Gunner, MD  Active   dicyclomine (BENTYL) 20 MG tablet [id number]  Take 1 tablet (20 mg total) by mouth in the morning and at bedtime. Garnette Gunner, MD  Active   famotidine (PEPCID) 40 MG tablet [id number]  Take 1 tablet (40 mg total) by mouth daily. Garnette Gunner, MD  Active   lisinopril (ZESTRIL) 10 MG tablet [id number]  Take 1 tablet (10 mg total) by mouth daily. Garnette Gunner, MD  Active   montelukast (SINGULAIR) 10 MG tablet [id number]  TAKE 1 TABLET BY MOUTH  EVERYDAY AT BEDTIME Garnette Gunner, MD  Active   pantoprazole (PROTONIX) 40 MG tablet [id number]  Take 1 tablet (40 mg total) by mouth daily. Garnette Gunner, MD  Active   rosuvastatin (CRESTOR) 10 MG tablet [id number]  Take 1 tablet (10 mg total) by mouth daily. Garnette Gunner, MD  Active Self            Home Care and Equipment/Supplies: Were Home Health Services Ordered?: NA Any new equipment or medical supplies ordered?: NA  Functional Questionnaire: Do you need assistance with bathing/showering or dressing?: No Do you need assistance with meal preparation?: No Do you need assistance with eating?: No Do you have difficulty maintaining continence: No Do you need assistance with getting out of bed/getting out of a chair/moving?: No Do you have difficulty managing or taking your medications?: No  Follow up appointments reviewed: PCP Follow-up appointment confirmed?: No MD Provider Line Number:[phone] Given: No Specialist Hospital Follow-up appointment confirmed?: Yes Date of Specialist follow-up appointment?: 01/10/23 Follow-Up Specialty Provider:: Dr. Blanchie Dessert- Ortho Do you need transportation to your follow-up appointment?: No Do you understand care options if your condition(s) worsen?: Yes-patient verbalized understanding    SIGNATURE: Janna Oak,CMA

## 2023-01-05 NOTE — Progress Notes (Signed)
Patient is going to see a Orthopedic Surgeon tomorrow she will discuss with them.

## 2023-01-09 ENCOUNTER — Inpatient Hospital Stay: Payer: 59

## 2023-01-09 ENCOUNTER — Encounter: Payer: Self-pay | Admitting: Hematology and Oncology

## 2023-01-09 ENCOUNTER — Inpatient Hospital Stay: Payer: 59 | Attending: Hematology and Oncology | Admitting: Hematology and Oncology

## 2023-01-09 VITALS — BP 108/66 | HR 90 | Temp 97.5°F | Resp 18 | Ht 68.0 in | Wt 153.2 lb

## 2023-01-09 DIAGNOSIS — D500 Iron deficiency anemia secondary to blood loss (chronic): Secondary | ICD-10-CM | POA: Diagnosis present

## 2023-01-09 DIAGNOSIS — D582 Other hemoglobinopathies: Secondary | ICD-10-CM

## 2023-01-09 DIAGNOSIS — N926 Irregular menstruation, unspecified: Secondary | ICD-10-CM | POA: Insufficient documentation

## 2023-01-09 DIAGNOSIS — D539 Nutritional anemia, unspecified: Secondary | ICD-10-CM | POA: Insufficient documentation

## 2023-01-09 NOTE — Assessment & Plan Note (Signed)
The cause of her anemia is multifactorial It was related to recent irregular menstrual cycle, background history of hemoglobin C trait as well as possible anemia chronic illness secondary to inflammation I recommend the patient to continue her oral iron supplement and I plan to see her back in 3 months with repeat labs

## 2023-01-09 NOTE — Assessment & Plan Note (Signed)
We discussed pathophysiology of hemoglobin C trait At baseline, her hemoglobin approaches normal but typically hovers around 11-11.5

## 2023-01-09 NOTE — Progress Notes (Signed)
[facility] CONSULT NOTE  Patient Care Team: Garnette Gunner, MD as PCP - General (Family Medicine) Maisie Fus, MD as PCP - Cardiology (Cardiology) Drema Dallas, DO as Consulting Physician (Neurology) Lorriane Shire, MD as Consulting Physician (Obstetrics and Gynecology)  ASSESSMENT & PLAN:  Deficiency anemia The cause of her anemia is multifactorial It was related to recent irregular menstrual cycle, background history of hemoglobin C trait as well as possible anemia chronic illness secondary to inflammation I recommend the patient to continue her oral iron supplement and I plan to see her back in 3 months with repeat labs  Hemoglobin C trait (HCC) We discussed pathophysiology of hemoglobin C trait At baseline, her hemoglobin approaches normal but typically hovers around 11-11.5  Iron deficiency anemia due to chronic blood loss I have reviewed multiple imaging She had pelvic ultrasound, CT imaging as well as endometrial biopsy that confirmed benign etiology She could benefit from repeat imaging study in the future to follow-up on the abnormal cystic appearance of the left ovary Orders Placed This Encounter  Procedures   CMP (Cancer Center only)    Standing Status:   Future    Standing Expiration Date:   01/09/2024   CBC with Differential (Cancer Center Only)    Standing Status:   Future    Standing Expiration Date:   01/09/2024   Ferritin    Standing Status:   Future    Standing Expiration Date:   01/09/2024   Sedimentation rate    Standing Status:   Future    Standing Expiration Date:   01/09/2024   Iron and Iron Binding Capacity ([facility] only)    Standing Status:   Future    Standing Expiration Date:   01/09/2024   Vitamin B12    Standing Status:   Future    Standing Expiration Date:   01/09/2024   ABO/Rh    Standing Status:   Future    Standing Expiration Date:   01/09/2024    All questions were answered. The patient knows to call the clinic with  any problems, questions or concerns.  The total time spent in the appointment was 55 minutes encounter with patients including review of chart and various tests results, discussions about plan of care and coordination of care plan  Artis Delay, MD 8/19/20242:00 PM   CHIEF COMPLAINTS/PURPOSE OF CONSULTATION:  Anemia  HISTORY OF PRESENTING ILLNESS:  Barbara [name] 62 y.o. female is here because of anemia  She was found to have abnormal CBC from recent blood work I have the opportunity to review her blood work dated back to the last 8 years She had 2 documented normal hemoglobin which I doubt the values are accurate.  In general, her hemoglobin is around 11-11.5. She had hemoglobin electrophoresis done in the past which confirmed diagnosis of hemoglobin C trait 3 months ago, the patient had heavy menstruation.  At her current age, she is still having menstrual cycle up until recently. The patient had pelvic ultrasound as well as endometrial biopsy that confirmed benign etiology. She was found to be anemic but most recently, approximately a week ago, went to the emergency department for left flank pain CT imaging showed no evidence of kidney stones and she was subsequently discharged   She denies recent chest pain on exertion, shortness of breath on minimal exertion, pre-syncopal episodes, or palpitations. She had not noticed any recent bleeding such as epistaxis, hematuria or hematochezia The patient denies over the counter NSAID ingestion.  She is not on antiplatelets agents. Her last colonoscopy was several years ago.  She is known to have history of stomach ulcer but has since stopped consuming NSAID for at least over a year She had no prior history or diagnosis of cancer. Her age appropriate screening programs are up-to-date. She denies any pica and eats a variety of diet. She never donated blood or received blood transfusion The patient was prescribed oral iron supplements and she takes  oral iron supplement daily She is in the process of being referred to see rheumatologist due to presence of Raynaud's phenomenon  MEDICAL HISTORY:  Past Medical History:  Diagnosis Date   Allergy 1965   Nuts, trees, grass, bees, red ants   Anemia 1980   Arthritis 2007   GERD (gastroesophageal reflux disease)    Hypertension    Low iron    Ulcer 1981   Viral gastroenteritis 07/24/2022    SURGICAL HISTORY: Past Surgical History:  Procedure Laterality Date   ANTERIOR CRUCIATE LIGAMENT REPAIR     CHOLECYSTECTOMY     ENDOMETRIAL ABLATION     LEFT HEART CATHETERIZATION WITH CORONARY ANGIOGRAM N/A 08/27/2014   Procedure: LEFT HEART CATHETERIZATION WITH CORONARY ANGIOGRAM;  Surgeon: Iran Ouch, MD;  Location: [facility] CATH LAB;  Service: Cardiovascular;  Laterality: N/A;   TUBAL LIGATION  1980    SOCIAL HISTORY: Social History   Socioeconomic History   Marital status: Divorced    Spouse name: Not on file   Number of children: 2   Years of education: Not on file   Highest education level: Bachelor's degree (e.g., BA, AB, BS)  Occupational History   Occupation: Production designer, theatre/television/film  Tobacco Use   Smoking status: Former    Current packs/day: 0.00    Types: Cigarettes    Quit date: 08/27/2011    Years since quitting: 11.3   Smokeless tobacco: Never  Vaping Use   Vaping status: Never Used  Substance and Sexual Activity   Alcohol use: Yes    Comment: occasionally, glass of wine   Drug use: Not on file   Sexual activity: Yes    Birth control/protection: Surgical  Other Topics Concern   Not on file  Social History Narrative   Right handed   Drinks caffeine   Lives with brother 2nd floor apartment   employed   Social Determinants of Health   Financial Resource Strain: Low Risk  (10/15/2022)   Overall Financial Resource Strain (CARDIA)    Difficulty of Paying Living Expenses: Not very hard  Food Insecurity: No Food Insecurity (10/15/2022)   Hunger Vital Sign    Worried About Running  Out of Food in the Last Year: Never true    Ran Out of Food in the Last Year: Never true  Transportation Needs: No Transportation Needs (10/15/2022)   PRAPARE - Administrator, Civil Service (Medical): No    Lack of Transportation (Non-Medical): No  Physical Activity: Sufficiently Active (10/15/2022)   Exercise Vital Sign    Days of Exercise per Week: 5 days    Minutes of Exercise per Session: 50 min  Stress: Stress Concern Present (10/15/2022)   [employer] of Occupational Health - Occupational Stress Questionnaire    Feeling of Stress : Rather much  Social Connections: Moderately Isolated (10/15/2022)   Social Connection and Isolation Panel [NHANES]    Frequency of Communication with Friends and Family: More than three times a week    Frequency of Social Gatherings with Friends and Family: More  than three times a week    Attends Religious Services: 1 to 4 times per year    Active Member of Clubs or Organizations: No    Attends Banker Meetings: Not on file    Marital Status: Divorced  Intimate Partner Violence: Not At Risk (10/09/2022)   Humiliation, Afraid, Rape, and Kick questionnaire    Fear of Current or Ex-Partner: No    Emotionally Abused: No    Physically Abused: No    Sexually Abused: No    FAMILY HISTORY: Family History  Problem Relation Age of Onset   CAD Brother 21       CABG at age 35, and deceased   Heart disease Brother    Hypertension Brother    CAD Sister 78       1 stent   CAD Father 65       Deceased in 71s   Heart disease Father    Hypertension Father    Cancer Mother    Heart disease Brother    Hypertension Brother    Hypertension Daughter    Hypertension Maternal Aunt    Stroke Maternal Aunt    Hypertension Paternal Uncle     ALLERGIES:  is allergic to iodinated contrast media, iodine, morphine and codeine, other, sulfa antibiotics, and sulfamethoxazole-trimethoprim.  MEDICATIONS:  Current Outpatient Medications   Medication Sig Dispense Refill   polyethylene glycol (MIRALAX / GLYCOLAX) 17 g packet Take 17 g by mouth daily.     amLODipine (NORVASC) 5 MG tablet TAKE 1 TABLET (5 MG TOTAL) BY MOUTH DAILY. 90 tablet 0   cyclobenzaprine (FLEXERIL) 5 MG tablet Take 1-2 tablets (5-10 mg total) by mouth at bedtime. 30 tablet 1   diclofenac Sodium (VOLTAREN) 1 % GEL APPLY 4 G TOPICALLY 4 (FOUR) TIMES DAILY AS NEEDED. 100 g 3   famotidine (PEPCID) 40 MG tablet Take 1 tablet (40 mg total) by mouth daily. 90 tablet 1   lisinopril (ZESTRIL) 10 MG tablet Take 1 tablet (10 mg total) by mouth daily.     montelukast (SINGULAIR) 10 MG tablet TAKE 1 TABLET BY MOUTH EVERYDAY AT BEDTIME 90 tablet 0   pantoprazole (PROTONIX) 40 MG tablet Take 1 tablet (40 mg total) by mouth daily. 90 tablet 0   rosuvastatin (CRESTOR) 10 MG tablet Take 1 tablet (10 mg total) by mouth daily. 90 tablet 3   No current facility-administered medications for this visit.    REVIEW OF SYSTEMS:   Constitutional: Denies fevers, chills or abnormal night sweats Eyes: Denies blurriness of vision, double vision or watery eyes Ears, nose, mouth, throat, and face: Denies mucositis or sore throat Respiratory: Denies cough, dyspnea or wheezes Cardiovascular: Denies palpitation, chest discomfort or lower extremity swelling Gastrointestinal:  Denies nausea, heartburn or change in bowel habits Skin: Denies abnormal skin rashes Lymphatics: Denies new lymphadenopathy or easy bruising Neurological:Denies numbness, tingling or new weaknesses Behavioral/Psych: Mood is stable, no new changes  All other systems were reviewed with the patient and are negative.  PHYSICAL EXAMINATION: ECOG PERFORMANCE STATUS: 0 - Asymptomatic  Vitals:   01/09/23 0951  BP: 108/66  Pulse: 90  Resp: 18  Temp: (!) 97.5 F (36.4 C)  SpO2: 100%   Filed Weights   01/09/23 0951  Weight: 153 lb 3.2 oz (69.5 kg)    GENERAL:alert, no distress and comfortable SKIN: skin color,  texture, turgor are normal, no rashes or significant lesions EYES: normal, conjunctiva are pink and non-injected, sclera clear OROPHARYNX:no exudate, no erythema  and lips, buccal mucosa, and tongue normal  NECK: supple, thyroid normal size, non-tender, without nodularity LYMPH:  no palpable lymphadenopathy in the cervical, axillary or inguinal LUNGS: clear to auscultation and percussion with normal breathing effort HEART: regular rate & rhythm and no murmurs and no lower extremity edema ABDOMEN:abdomen soft, non-tender and normal bowel sounds Musculoskeletal:no cyanosis of digits and no clubbing  PSYCH: alert & oriented x 3 with fluent speech NEURO: no focal motor/sensory deficits  RADIOGRAPHIC STUDIES: I have reviewed CT imaging with the patient I have personally reviewed the radiological images as listed and agreed with the findings in the report. MR CERVICAL SPINE W WO CONTRAST  Result Date: 01/05/2023 CLINICAL DATA:  Right upper and lower extremity weakness and paresthesia. Left shoulder pain and neck pain EXAM: MRI CERVICAL SPINE WITHOUT AND WITH CONTRAST TECHNIQUE: Multiplanar and multiecho pulse sequences of the cervical spine, to include the craniocervical junction and cervicothoracic junction, were obtained without and with intravenous contrast. CONTRAST:  7 cc of vueway intravenous COMPARISON:  None Available. FINDINGS: Alignment: Straightening of cervical lordosis. Vertebrae: No fracture, evidence of discitis, or bone lesion. Cord: Normal signal and morphology. Posterior Fossa, vertebral arteries, paraspinal tissues: Negative. Disc levels: C2-3: Tiny central protrusion. C3-4: Unremarkable. C4-5: Unremarkable. C5-6: Small central protrusion.  No neural impingement C6-7: Mild disc bulging and endplate ridging. C7-T1:Unremarkable. IMPRESSION: 1. Normal MRI of the cervical cord. 2. Limited degenerative changes without impingement. 3. No explanation for extremity symptoms. Electronically Signed    By: Tiburcio Pea M.D.   On: 01/05/2023 06:09   CT Renal Stone Study  Result Date: 01/02/2023 CLINICAL DATA:  Abdominal pain.  Concern for kidney stone. EXAM: CT ABDOMEN AND PELVIS WITHOUT CONTRAST TECHNIQUE: Multidetector CT imaging of the abdomen and pelvis was performed following the standard protocol without IV contrast. RADIATION DOSE REDUCTION: This exam was performed according to the departmental dose-optimization program which includes automated exposure control, adjustment of the mA and/or kV according to patient size and/or use of iterative reconstruction technique. COMPARISON:  CT abdomen pelvis dated 10/18/2021. FINDINGS: Evaluation of this exam is limited in the absence of intravenous contrast. Lower chest: The visualized lung bases are clear. No intra-abdominal free air or free fluid. Hepatobiliary: The liver is unremarkable. No biliary dilatation. Cholecystectomy. No retained calcified stone noted in the central CBD. Pancreas: Unremarkable. No pancreatic ductal dilatation or surrounding inflammatory changes. Spleen: Normal in size without focal abnormality. Adrenals/Urinary Tract: The adrenal glands are unremarkable. The kidneys, visualized ureters, and urinary bladder appear unremarkable. Stomach/Bowel: There is no bowel obstruction or active inflammation. The appendix is normal. Vascular/Lymphatic: The abdominal aorta and IVC are unremarkable. No portal venous gas. There is no adenopathy. Reproductive: The uterus is grossly unremarkable. There is a 3 cm left ovarian or paraovarian cyst, stable since the prior CT. This was previously evaluated by ultrasound on 11/01/2022. Other: None Musculoskeletal: Osteopenia.  No acute osseous pathology. IMPRESSION: 1. No acute intra-abdominal or pelvic pathology. No hydronephrosis or nephrolithiasis. 2. Stable left ovarian or paraovarian cyst. Electronically Signed   By: Elgie Collard M.D.   On: 01/02/2023 15:58   NM PET CT CARDIAC PERFUSION MULTI  W/ABSOLUTE BLOODFLOW  Result Date: 12/27/2022   LV perfusion is normal.   Rest left ventricular function is normal. Rest EF: 62%. Stress left ventricular function is normal. Stress EF: 71%. End diastolic cavity size is normal. End systolic cavity size is normal.   Myocardial blood flow was computed to be 0.39ml/g/min at rest and 2.55ml/g/min at stress.  Global myocardial blood flow reserve was 2.78 and was normal.   Coronary calcium was present on the attenuation correction CT images. Minimal coronary calcifications were present. Coronary calcifications were present in the left anterior descending artery distribution(s).   The study is normal. The study is low risk.   Electronically Signed by Thurmon Fair, MD CLINICAL DATA:  This over-read does not include interpretation of cardiac or coronary anatomy or pathology. The cardiac PET-CT interpretation by the cardiologist is attached. COMPARISON:  No priors. FINDINGS: Within the visualized portions of the thorax there are no suspicious appearing pulmonary nodules or masses, there is no acute consolidative airspace disease, no pleural effusions, no pneumothorax and no lymphadenopathy. Visualized portions of the upper abdomen are unremarkable. There are no aggressive appearing lytic or blastic lesions noted in the visualized portions of the skeleton. IMPRESSION: 1. No significant incidental noncardiac findings are noted. Electronically Signed   By: Trudie Reed M.D.   On: 12/27/2022 11:27

## 2023-01-09 NOTE — Assessment & Plan Note (Signed)
I have reviewed multiple imaging She had pelvic ultrasound, CT imaging as well as endometrial biopsy that confirmed benign etiology She could benefit from repeat imaging study in the future to follow-up on the abnormal cystic appearance of the left ovary

## 2023-01-13 ENCOUNTER — Other Ambulatory Visit: Payer: Self-pay | Admitting: Family Medicine

## 2023-01-13 DIAGNOSIS — M5416 Radiculopathy, lumbar region: Secondary | ICD-10-CM

## 2023-01-13 DIAGNOSIS — M545 Low back pain, unspecified: Secondary | ICD-10-CM

## 2023-01-17 NOTE — Progress Notes (Signed)
Office Visit Note  Patient: Barbara [name]             Date of Birth: Mar 20, 1961           MRN: [id number]             PCP: Garnette Gunner, MD Referring: Garnette Gunner, MD Visit Date: 01/18/2023 Occupation: [employer] manager  Subjective:  New Patient (Initial Visit) (Patient states she currently has pain in her lower back. Patient states she has Raynaud's syndrome and her hands go numb sometimes. )   History of Present Illness: Barbara [name] is a 62 y.o. female here for evaluation of Raynaud's syndrome with intermittent finger discoloration with associated coldness and numbness affecting both hands which is new during the past year.  She did not recall this problem before this past winter time.  Episodes usually occur when her hands get very cold although she has a history of cold sensation in her hands and feet going back many years.  She also notices episodes some of the time after eating meals is starts after completing the meal.  Typically symptoms last for about 15 to 20 minutes before normal color returning on its own with rewarming.  Her fingers are numb while discolored but has normal sensation the rest of the time.  She has photographs of the fingertips with pallor that we reviewed on cell phone today.  Outside of episodes has not noticed any skin changes no pitting ulcers scabs or other changes.  Her toes frequently get cold and numb as well but usually cannot see them due to foot wear so was not sure about discoloration. Lab test in primary care office in February were negative for ANA or serum inflammatory markers.  Due to the numbness sensations in her arm and leg went for nerve conduction study in June this was normal.  More recently also had a nuclear medicine cardiac CT study and a cervical spine MRI these were both normal and low risk.  She started on amlodipine currently taking 5 mg daily has not noticed any appreciable difference in symptoms with this.  Does not have lower  extremity swelling. She is a former smoker but quit since around 10 years ago.  Has a distant family history of lupus no first-degree relatives with known autoimmune disease.  No personal history of thyroid disease.  She was previously evaluated for blood clot with a positive D-dimer test checked during episode of chest pain but no clot identified.  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  06/2022 ANA neg ESR wnl CRP wnl  Imaging reviewed 12/26/22 MRI Cervical Spine IMPRESSION: 1. Normal MRI of the cervical cord. 2. Limited degenerative changes without impingement. 3. No explanation for extremity symptoms.  12/27/22 NM PET CT  The study was normal.  The study is low risk. No significant incidental noncardiac findings are noted.  11/04/22 NCV with EMG Impression: This is a normal study of the right upper and lower extremities.  In particular, there is no evidence of a large fiber sensorimotor polyneuropathy, cervical/lumbosacral radiculopathy, or carpal tunnel syndrome.  Activities of Daily Living:  Patient reports morning stiffness for 0 minute.   Patient Reports nocturnal pain.  Difficulty dressing/grooming: Denies Difficulty climbing stairs: Denies Difficulty getting out of chair: Denies Difficulty using hands for taps, buttons, cutlery, and/or writing: Reports  Review of Systems  Constitutional:  Positive for fatigue.  HENT:  Positive for mouth dryness. Negative for mouth sores.   Eyes:  Negative for dryness.  Respiratory:  Negative for shortness of breath.   Cardiovascular:  Positive for chest pain and palpitations.  Gastrointestinal:  Positive for constipation. Negative for blood in stool and diarrhea.  Endocrine: Negative for increased urination.  Genitourinary:  Negative for involuntary urination.  Musculoskeletal:  Positive for joint pain, joint pain, myalgias, muscle  weakness and myalgias. Negative for gait problem, joint swelling, morning stiffness and muscle tenderness.  Skin:  Positive for sensitivity to sunlight. Negative for color change, rash and hair loss.  Allergic/Immunologic: Negative for susceptible to infections.  Neurological:  Positive for dizziness and headaches.  Hematological:  Negative for swollen glands.  Psychiatric/Behavioral:  Positive for sleep disturbance. Negative for depressed mood. The patient is not nervous/anxious.     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   Deficiency anemia 01/09/2023   Iron deficiency anemia due to chronic blood loss 12/08/2022   Tachycardia 12/08/2022   Diarrhea 10/19/2022   Syncope and collapse 10/09/2022   Hypokalemia 10/09/2022   Prolonged QT interval 10/09/2022   Raynaud's phenomenon without gangrene 07/11/2022   Lower back pain 03/28/2022   Onychomycosis 03/28/2022   Primary hypertension 12/23/2021   Abnormal uterine bleeding (AUB) 11/25/2021   GERD (gastroesophageal reflux disease) 08/27/2014    Past Medical History:  Diagnosis Date   Allergy 1965   Nuts, trees, grass, bees, red ants   Anemia 1980   Arthritis 2007   GERD (gastroesophageal reflux disease)    Hypertension    Low iron    Ulcer 1981   Viral gastroenteritis 07/24/2022    Family History  Problem Relation Age of Onset   Cancer Mother    CAD Father 62       Deceased in 53s   Heart disease Father    Hypertension Father    CAD Sister 8       1 stent   CAD Brother 78       CABG at age 17, and deceased   Heart disease Brother    Hypertension Brother    Heart disease Brother    Hypertension Brother    Hypertension Daughter    Hypertension Maternal Aunt    Stroke Maternal Aunt    Hypertension Paternal Uncle    Past Surgical History:  Procedure Laterality Date   ANTERIOR CRUCIATE LIGAMENT REPAIR     CHOLECYSTECTOMY     ENDOMETRIAL ABLATION      LEFT HEART CATHETERIZATION WITH CORONARY ANGIOGRAM N/A 08/27/2014   Procedure: LEFT HEART CATHETERIZATION WITH CORONARY ANGIOGRAM;  Surgeon: Iran Ouch, MD;  Location: [facility]ATH [facility];  Service: Cardiovascular;  Laterality: N/A;   TUBAL LIGATION  1980   Social History   Social History Narrative   Right handed   Drinks caffeine   Lives with brother 2nd floor apartment   employed   Immunization History  Administered Date(s) Administered   PFIZER(Purple Top)SARS-COV-2 Vaccination 08/15/2019, 09/09/2019     Objective: Vital Signs: BP 102/69 (BP Location: Right Arm, Patient Position: Sitting, Cuff Size: Normal)   Pulse 89   Resp 12   Ht 5\' 8"  (1.727 m)   Wt 153 lb (69.4 kg)   BMI 23.26 kg/m    Physical Exam HENT:     Mouth/Throat:     Mouth: Mucous membranes are moist.     Pharynx: Oropharynx is clear.  Eyes:  Conjunctiva/sclera: Conjunctivae normal.  Cardiovascular:     Rate and Rhythm: Normal rate and regular rhythm.  Pulmonary:     Effort: Pulmonary effort is normal.     Breath sounds: Normal breath sounds.  Musculoskeletal:     Right lower leg: No edema.     Left lower leg: No edema.  Lymphadenopathy:     Cervical: No cervical adenopathy.  Skin:    General: Skin is warm and dry.     Findings: No rash.     Comments: No nail and digital pitting, no telangiectasias Nonspecific nailfold capillary changes left 3rd digit, no dropout or hemorrhages  Neurological:     Mental Status: She is alert.  Psychiatric:        Mood and Affect: Mood normal.      Musculoskeletal Exam:  Elbows full ROM no tenderness or swelling Wrists full ROM no tenderness or swelling Fingers full ROM no tenderness or swelling Knees full ROM no tenderness or swelling Ankles full ROM no tenderness or swelling Mild first MTP bunions with mild lateral deviation bilaterally Callus on bottom of first and second MTP joints   Investigation: No additional findings.  Imaging: CT Renal  Stone Study  Result Date: 01/02/2023 CLINICAL DATA:  Abdominal pain.  Concern for kidney stone. EXAM: CT ABDOMEN AND PELVIS WITHOUT CONTRAST TECHNIQUE: Multidetector CT imaging of the abdomen and pelvis was performed following the standard protocol without IV contrast. RADIATION DOSE REDUCTION: This exam was performed according to the departmental dose-optimization program which includes automated exposure control, adjustment of the mA and/or kV according to patient size and/or use of iterative reconstruction technique. COMPARISON:  CT abdomen pelvis dated 10/18/2021. FINDINGS: Evaluation of this exam is limited in the absence of intravenous contrast. Lower chest: The visualized lung bases are clear. No intra-abdominal free air or free fluid. Hepatobiliary: The liver is unremarkable. No biliary dilatation. Cholecystectomy. No retained calcified stone noted in the central CBD. Pancreas: Unremarkable. No pancreatic ductal dilatation or surrounding inflammatory changes. Spleen: Normal in size without focal abnormality. Adrenals/Urinary Tract: The adrenal glands are unremarkable. The kidneys, visualized ureters, and urinary bladder appear unremarkable. Stomach/Bowel: There is no bowel obstruction or active inflammation. The appendix is normal. Vascular/Lymphatic: The abdominal aorta and IVC are unremarkable. No portal venous gas. There is no adenopathy. Reproductive: The uterus is grossly unremarkable. There is a 3 cm left ovarian or paraovarian cyst, stable since the prior CT. This was previously evaluated by ultrasound on 11/01/2022. Other: None Musculoskeletal: Osteopenia.  No acute osseous pathology. IMPRESSION: 1. No acute intra-abdominal or pelvic pathology. No hydronephrosis or nephrolithiasis. 2. Stable left ovarian or paraovarian cyst. Electronically Signed   By: Elgie Collard M.D.   On: 01/02/2023 15:58    Recent Labs: Lab Results  Component Value Date   WBC 7.1 01/02/2023   HGB 10.5 (L) 01/02/2023    PLT 292 01/02/2023   NA 138 01/02/2023   K 3.7 01/02/2023   CL 103 01/02/2023   CO2 21 (L) 01/02/2023   GLUCOSE 89 01/02/2023   BUN 19 01/02/2023   CREATININE 1.05 (H) 01/02/2023   BILITOT 0.3 01/02/2023   ALKPHOS 80 01/02/2023   AST 30 01/02/2023   ALT 34 01/02/2023   PROT 8.0 01/02/2023   ALBUMIN 4.5 01/02/2023   CALCIUM 9.6 01/02/2023   GFRAA >60 07/17/2018    Speciality Comments: No specialty comments available.  Procedures:  No procedures performed Allergies: Iodinated contrast media, Iodine, Morphine and codeine, Other, Sulfa antibiotics, and Sulfamethoxazole-trimethoprim  Assessment / Plan:     Visit Diagnoses: Raynaud's phenomenon without gangrene  Reported symptoms appear typical for Raynaud's syndrome we reviewed photographs she recorded as well.  No evidence of digital ischemic changes or abnormal nailfold capillary structure.  She is a former smoker but no particular suggestion of peripheral vascular disease.  Occasional finger numbness but is also not typical for peripheral neuropathy or carpal tunnel syndrome, and no involvement in her feet would be unusual for length-dependent axonal process.  She has on amlodipine 5 mg daily and tolerates this well.  Symptoms are currently mild and I believe she is at low risk of complications in the short-term.  Reviewed primary intervention of maintaining hand and core body temperature.  I recommend we plan to follow-up in wintertime can see if symptom frequency or severity increases.  Orders: No orders of the defined types were placed in this encounter.  No orders of the defined types were placed in this encounter.    Follow-Up Instructions: Return in about 5 months (around 06/20/2023) for New pt raynaud's f/u 4-45mos (winter).   Fuller Plan, MD  Note - This record has been created using [employer].  Chart creation errors have been sought, but may not always  have been located. Such creation errors do not  reflect on  the standard of medical care.

## 2023-01-18 ENCOUNTER — Ambulatory Visit: Payer: 59 | Attending: Internal Medicine | Admitting: Internal Medicine

## 2023-01-18 ENCOUNTER — Encounter: Payer: Self-pay | Admitting: Internal Medicine

## 2023-01-18 VITALS — BP 102/69 | HR 89 | Resp 12 | Ht 68.0 in | Wt 153.0 lb

## 2023-01-18 DIAGNOSIS — I7300 Raynaud's syndrome without gangrene: Secondary | ICD-10-CM | POA: Diagnosis not present

## 2023-01-25 ENCOUNTER — Encounter: Payer: Self-pay | Admitting: Nurse Practitioner

## 2023-01-25 ENCOUNTER — Telehealth: Payer: Self-pay | Admitting: Gastroenterology

## 2023-01-25 NOTE — Telephone Encounter (Signed)
Hi Dr. Tomasa Rand,  Supervising Provider: 01/25/23-PM  We received a referral for patient to be evaluated for astroesophageal reflux disease, unspecified whether esophagitis present Diarrhea, unspecified type. The patient does have GI history with [facility] and is requesting to transfer her care. Records were obtained and scanned into [employer] for review, however I misread the comments in referral and thought the patient was last seen 3 yrs ago and the patient was scheduled with an APP for current symptoms. Not realizing the patient had an EGD and seen last year. Please review and advise if it would be okay to leave her on the schedule or cancel appt. I apologies in advance.   Thank you

## 2023-02-04 ENCOUNTER — Other Ambulatory Visit: Payer: Self-pay | Admitting: Family Medicine

## 2023-02-04 DIAGNOSIS — I10 Essential (primary) hypertension: Secondary | ICD-10-CM

## 2023-02-06 NOTE — Telephone Encounter (Signed)
Rx refill denied for hydrochlorothiazide (HYDRODIURIL) 25 MG tablet. The original prescription was discontinued on 12/08/2022 by Garnette Gunner, MD.

## 2023-02-12 ENCOUNTER — Ambulatory Visit
Admission: RE | Admit: 2023-02-12 | Discharge: 2023-02-12 | Disposition: A | Discharge status: Home / Self Care | Payer: 59 | Source: Ambulatory Visit | Attending: Family Medicine

## 2023-02-12 DIAGNOSIS — M545 Low back pain, unspecified: Secondary | ICD-10-CM

## 2023-02-12 DIAGNOSIS — M5416 Radiculopathy, lumbar region: Secondary | ICD-10-CM

## 2023-02-21 ENCOUNTER — Other Ambulatory Visit: Payer: Self-pay | Admitting: Family Medicine

## 2023-02-21 DIAGNOSIS — M545 Low back pain, unspecified: Secondary | ICD-10-CM

## 2023-03-05 ENCOUNTER — Other Ambulatory Visit: Payer: Self-pay | Admitting: Family Medicine

## 2023-03-05 DIAGNOSIS — I10 Essential (primary) hypertension: Secondary | ICD-10-CM

## 2023-03-13 ENCOUNTER — Other Ambulatory Visit: Payer: Self-pay | Admitting: Family Medicine

## 2023-03-13 DIAGNOSIS — J302 Other seasonal allergic rhinitis: Secondary | ICD-10-CM

## 2023-03-16 ENCOUNTER — Other Ambulatory Visit: Payer: Self-pay | Admitting: Family Medicine

## 2023-03-16 ENCOUNTER — Other Ambulatory Visit (HOSPITAL_COMMUNITY): Payer: Self-pay

## 2023-03-16 DIAGNOSIS — E782 Mixed hyperlipidemia: Secondary | ICD-10-CM

## 2023-03-16 DIAGNOSIS — I10 Essential (primary) hypertension: Secondary | ICD-10-CM

## 2023-03-24 ENCOUNTER — Other Ambulatory Visit: Payer: Self-pay | Admitting: Family Medicine

## 2023-03-24 DIAGNOSIS — G8929 Other chronic pain: Secondary | ICD-10-CM

## 2023-03-27 ENCOUNTER — Inpatient Hospital Stay: Payer: 59 | Attending: Hematology and Oncology

## 2023-03-27 DIAGNOSIS — K649 Unspecified hemorrhoids: Secondary | ICD-10-CM | POA: Insufficient documentation

## 2023-03-27 DIAGNOSIS — D638 Anemia in other chronic diseases classified elsewhere: Secondary | ICD-10-CM | POA: Insufficient documentation

## 2023-03-27 DIAGNOSIS — D582 Other hemoglobinopathies: Secondary | ICD-10-CM | POA: Diagnosis present

## 2023-03-27 DIAGNOSIS — I7300 Raynaud's syndrome without gangrene: Secondary | ICD-10-CM | POA: Insufficient documentation

## 2023-03-27 DIAGNOSIS — D539 Nutritional anemia, unspecified: Secondary | ICD-10-CM

## 2023-03-27 LAB — CBC WITH DIFFERENTIAL (CANCER CENTER ONLY)
Abs Immature Granulocytes: 0.01 10*3/uL (ref 0.00–0.07)
Basophils Absolute: 0 10*3/uL (ref 0.0–0.1)
Basophils Relative: 0 %
Eosinophils Absolute: 0.1 10*3/uL (ref 0.0–0.5)
Eosinophils Relative: 1 %
HCT: 29.3 % — ABNORMAL LOW (ref 36.0–46.0)
Hemoglobin: 10.2 g/dL — ABNORMAL LOW (ref 12.0–15.0)
Immature Granulocytes: 0 %
Lymphocytes Relative: 40 %
Lymphs Abs: 2.6 10*3/uL (ref 0.7–4.0)
MCH: 28.2 pg (ref 26.0–34.0)
MCHC: 34.8 g/dL (ref 30.0–36.0)
MCV: 80.9 fL (ref 80.0–100.0)
Monocytes Absolute: 0.4 10*3/uL (ref 0.1–1.0)
Monocytes Relative: 7 %
Neutro Abs: 3.3 10*3/uL (ref 1.7–7.7)
Neutrophils Relative %: 52 %
Platelet Count: 264 10*3/uL (ref 150–400)
RBC: 3.62 MIL/uL — ABNORMAL LOW (ref 3.87–5.11)
RDW: 13.5 % (ref 11.5–15.5)
WBC Count: 6.4 10*3/uL (ref 4.0–10.5)
nRBC: 0 % (ref 0.0–0.2)

## 2023-03-27 LAB — CMP (CANCER CENTER ONLY)
ALT: 18 U/L (ref 0–44)
AST: 18 U/L (ref 15–41)
Albumin: 4.3 g/dL (ref 3.5–5.0)
Alkaline Phosphatase: 83 U/L (ref 38–126)
Anion gap: 5 (ref 5–15)
BUN: 20 mg/dL (ref 8–23)
CO2: 28 mmol/L (ref 22–32)
Calcium: 9.3 mg/dL (ref 8.9–10.3)
Chloride: 107 mmol/L (ref 98–111)
Creatinine: 1.08 mg/dL — ABNORMAL HIGH (ref 0.44–1.00)
GFR, Estimated: 58 mL/min — ABNORMAL LOW (ref >60)
Glucose, Bld: 106 mg/dL — ABNORMAL HIGH (ref 70–99)
Potassium: 3.7 mmol/L (ref 3.5–5.1)
Sodium: 140 mmol/L (ref 135–145)
Total Bilirubin: 0.3 mg/dL (ref <1.2)
Total Protein: 7.2 g/dL (ref 6.5–8.1)

## 2023-03-27 LAB — ABO/RH: ABO/RH(D): A POS

## 2023-03-27 LAB — FERRITIN: Ferritin: 129 ng/mL (ref 11–307)

## 2023-03-27 LAB — SEDIMENTATION RATE: Sed Rate: 38 mm/h — ABNORMAL HIGH (ref 0–22)

## 2023-03-27 LAB — IRON AND IRON BINDING CAPACITY (CC-WL,HP ONLY)
Iron: 116 ug/dL (ref 28–170)
Saturation Ratios: 38 % — ABNORMAL HIGH (ref 10.4–31.8)
TIBC: 308 ug/dL (ref 250–450)
UIBC: 192 ug/dL (ref 148–442)

## 2023-03-27 LAB — VITAMIN B12: Vitamin B-12: 344 pg/mL (ref 180–914)

## 2023-03-30 ENCOUNTER — Inpatient Hospital Stay: Payer: 59 | Admitting: Hematology and Oncology

## 2023-03-30 ENCOUNTER — Encounter: Payer: Self-pay | Admitting: Hematology and Oncology

## 2023-03-30 VITALS — BP 116/80 | HR 95 | Temp 98.5°F | Resp 18 | Ht 68.0 in | Wt 157.8 lb

## 2023-03-30 DIAGNOSIS — D638 Anemia in other chronic diseases classified elsewhere: Secondary | ICD-10-CM | POA: Diagnosis not present

## 2023-03-30 DIAGNOSIS — K649 Unspecified hemorrhoids: Secondary | ICD-10-CM

## 2023-03-30 DIAGNOSIS — D582 Other hemoglobinopathies: Secondary | ICD-10-CM | POA: Diagnosis not present

## 2023-03-30 NOTE — Assessment & Plan Note (Signed)
We discussed pathophysiology of hemoglobin C trait At baseline, her hemoglobin approaches normal but typically hovers around 11-11.5

## 2023-03-30 NOTE — Assessment & Plan Note (Signed)
Overall, I suspect she has anemia of chronic illness Her inflammatory marker with sedimentation rate is high She also had borderline kidney function The patient have Raynaud's phenomenon At this point in time, there is no additional treatment available unless her hemoglobin dropped to less than 10 She will continue close monitoring and follow-up with her primary care doctor

## 2023-03-30 NOTE — Assessment & Plan Note (Signed)
I recommend repeat colonoscopy with her gastroenterologist Her chronic bleeding could also contribute to her chronic anemia

## 2023-03-30 NOTE — Progress Notes (Signed)
[facility] OFFICE PROGRESS NOTE  Barbara Gunner, MD  ASSESSMENT & PLAN:  Hemoglobin C trait (HCC) We discussed pathophysiology of hemoglobin C trait At baseline, her hemoglobin approaches normal but typically hovers around 11-11.5  Anemia, chronic disease Overall, I suspect she has anemia of chronic illness Her inflammatory marker with sedimentation rate is high She also had borderline kidney function The patient have Raynaud's phenomenon At this point in time, there is no additional treatment available unless her hemoglobin dropped to less than 10 She will continue close monitoring and follow-up with her primary care doctor  Bleeding hemorrhoid I recommend repeat colonoscopy with her gastroenterologist Her chronic bleeding could also contribute to her chronic anemia  No orders of the defined types were placed in this encounter.   The total time spent in the appointment was 20 minutes encounter with patients including review of chart and various tests results, discussions about plan of care and coordination of care plan   All questions were answered. The patient knows to call the clinic with any problems, questions or concerns. No barriers to learning was detected.    Artis Delay, MD 11/7/202410:25 AM  INTERVAL HISTORY: Barbara [name] 62 y.o. female returns for review test results Since last time I saw her, she has intermittent chronic hemorrhoidal bleeding She has appointment to see gastroenterologist end of the month She has no further vaginal bleeding  SUMMARY OF HEMATOLOGIC HISTORY:  She was found to have abnormal CBC from recent blood work I have the opportunity to review her blood work dated back to the last 8 years She had 2 documented normal hemoglobin which I doubt the values are accurate.  In general, her hemoglobin is around 11-11.5. She had hemoglobin electrophoresis done in the past which confirmed diagnosis of hemoglobin C trait 3 months  ago, the patient had heavy menstruation.  At her current age, she is still having menstrual cycle up until recently. The patient had pelvic ultrasound as well as endometrial biopsy that confirmed benign etiology. She was found to be anemic but most recently, approximately a week ago, went to the emergency department for left flank pain CT imaging showed no evidence of kidney stones and she was subsequently discharged   She denies recent chest pain on exertion, shortness of breath on minimal exertion, pre-syncopal episodes, or palpitations. She had not noticed any recent bleeding such as epistaxis, hematuria or hematochezia The patient denies over the counter NSAID ingestion. She is not on antiplatelets agents. Her last colonoscopy was several years ago.  She is known to have history of stomach ulcer but has since stopped consuming NSAID for at least over a year She had no prior history or diagnosis of cancer. Her age appropriate screening programs are up-to-date. She denies any pica and eats a variety of diet. She never donated blood or received blood transfusion The patient was prescribed oral iron supplements and she takes oral iron supplement daily She is in the process of being referred to see rheumatologist due to presence of Raynaud's phenomenon Repeat labs in November 2024 confirm diagnosis of anemia chronic illness  I have reviewed the past medical history, past surgical history, social history and family history with the patient and they are unchanged from previous note.  ALLERGIES:  is allergic to iodinated contrast media, iodine, morphine and codeine, other, sulfa antibiotics, and sulfamethoxazole-trimethoprim.  MEDICATIONS:  Current Outpatient Medications  Medication Sig Dispense Refill   amLODipine (NORVASC) 5 MG tablet TAKE 1 TABLET (5  MG TOTAL) BY MOUTH DAILY. 90 tablet 0   cyclobenzaprine (FLEXERIL) 5 MG tablet TAKE 1-2 TABLETS (5-10 MG TOTAL) BY MOUTH AT BEDTIME. 30 tablet 1    diclofenac Sodium (VOLTAREN) 1 % GEL APPLY 4 G TOPICALLY 4 (FOUR) TIMES DAILY AS NEEDED. 100 g 3   famotidine (PEPCID) 40 MG tablet Take 1 tablet (40 mg total) by mouth daily. 90 tablet 1   lidocaine (LIDODERM) 5 % PLEASE SEE ATTACHED FOR DETAILED DIRECTIONS     lisinopril (ZESTRIL) 10 MG tablet TAKE 1 TABLET BY MOUTH TWICE A DAY 180 tablet 1   montelukast (SINGULAIR) 10 MG tablet TAKE 1 TABLET BY MOUTH EVERYDAY AT BEDTIME 90 tablet 0   pantoprazole (PROTONIX) 40 MG tablet Take 1 tablet (40 mg total) by mouth daily. 90 tablet 0   polyethylene glycol (MIRALAX / GLYCOLAX) 17 g packet Take 17 g by mouth daily.     rosuvastatin (CRESTOR) 10 MG tablet TAKE 1 TABLET BY MOUTH EVERY DAY 90 tablet 0   No current facility-administered medications for this visit.     REVIEW OF SYSTEMS:   Constitutional: Denies fevers, chills or night sweats Eyes: Denies blurriness of vision Ears, nose, mouth, throat, and face: Denies mucositis or sore throat Respiratory: Denies cough, dyspnea or wheezes Cardiovascular: Denies palpitation, chest discomfort or lower extremity swelling Gastrointestinal:  Denies nausea, heartburn or change in bowel habits Skin: Denies abnormal skin rashes Lymphatics: Denies new lymphadenopathy or easy bruising Neurological:Denies numbness, tingling or new weaknesses Behavioral/Psych: Mood is stable, no new changes  All other systems were reviewed with the patient and are negative.  PHYSICAL EXAMINATION: ECOG PERFORMANCE STATUS: 0 - Asymptomatic  Vitals:   03/30/23 0944  BP: 116/80  Pulse: 95  Resp: 18  Temp: 98.5 F (36.9 C)  SpO2: 100%   Filed Weights   03/30/23 0944  Weight: 157 lb 12.8 oz (71.6 kg)    GENERAL:alert, no distress and comfortable   LABORATORY DATA:  I have reviewed the data as listed     Component Value Date/Time   NA 140 03/27/2023 1409   K 3.7 03/27/2023 1409   CL 107 03/27/2023 1409   CO2 28 03/27/2023 1409   GLUCOSE 106 (H) 03/27/2023  1409   BUN 20 03/27/2023 1409   CREATININE 1.08 (H) 03/27/2023 1409   CALCIUM 9.3 03/27/2023 1409   PROT 7.2 03/27/2023 1409   ALBUMIN 4.3 03/27/2023 1409   AST 18 03/27/2023 1409   ALT 18 03/27/2023 1409   ALKPHOS 83 03/27/2023 1409   BILITOT 0.3 03/27/2023 1409   GFRNONAA 58 (L) 03/27/2023 1409   GFRAA >60 07/17/2018 2006    No results found for: "SPEP", "UPEP"  Lab Results  Component Value Date   WBC 6.4 03/27/2023   NEUTROABS 3.3 03/27/2023   HGB 10.2 (L) 03/27/2023   HCT 29.3 (L) 03/27/2023   MCV 80.9 03/27/2023   PLT 264 03/27/2023      Chemistry      Component Value Date/Time   NA 140 03/27/2023 1409   K 3.7 03/27/2023 1409   CL 107 03/27/2023 1409   CO2 28 03/27/2023 1409   BUN 20 03/27/2023 1409   CREATININE 1.08 (H) 03/27/2023 1409      Component Value Date/Time   CALCIUM 9.3 03/27/2023 1409   ALKPHOS 83 03/27/2023 1409   AST 18 03/27/2023 1409   ALT 18 03/27/2023 1409   BILITOT 0.3 03/27/2023 1409

## 2023-04-01 ENCOUNTER — Other Ambulatory Visit: Payer: Self-pay | Admitting: Family Medicine

## 2023-04-06 ENCOUNTER — Encounter: Payer: Self-pay | Admitting: Nurse Practitioner

## 2023-04-06 ENCOUNTER — Ambulatory Visit: Payer: 59 | Admitting: Nurse Practitioner

## 2023-04-06 VITALS — BP 100/62 | HR 89 | Ht 68.5 in | Wt 158.0 lb

## 2023-04-06 DIAGNOSIS — K625 Hemorrhage of anus and rectum: Secondary | ICD-10-CM

## 2023-04-06 DIAGNOSIS — R1310 Dysphagia, unspecified: Secondary | ICD-10-CM | POA: Diagnosis not present

## 2023-04-06 DIAGNOSIS — K219 Gastro-esophageal reflux disease without esophagitis: Secondary | ICD-10-CM

## 2023-04-06 DIAGNOSIS — D509 Iron deficiency anemia, unspecified: Secondary | ICD-10-CM | POA: Diagnosis not present

## 2023-04-06 MED ORDER — PANTOPRAZOLE SODIUM 40 MG PO TBEC: 40.0000 mg | DELAYED_RELEASE_TABLET | Freq: Two times a day (BID) | ORAL | 1 refills | Status: DC

## 2023-04-06 MED ORDER — NA SULFATE-K SULFATE-MG SULF 17.5-3.13-1.6 GM/177ML PO SOLN: 1.0000 | Freq: Once | ORAL | 0 refills | Status: AC

## 2023-04-06 NOTE — Progress Notes (Signed)
04/06/2023 Barbara [name] [id number] Apr 30, 1961   CHIEF COMPLAINT: Anemia, rectal bleeding, constipation   HISTORY OF PRESENT ILLNESS: Barbara [name] is a 62 year old female with a past medical history of arthritis, anemia, Reynauds phenomenon, hypertension, bleeding gastric ulcer age 63, GERD and colon polyps. Past cholecystectomy. She presents to our office today as referred by Dr. Fanny Bien for further evaluation regarding GERD and diarrhea. She stated her hematologist, Dr. Bertis Ruddy, also wanted her to undergo GI evaluation regarding chronic iron deficiency anemia. She has heartburn daily and intermittent dysphagia. She describes having food/pills or water that gets stuck in the upper esophagus which occurs 2 to 3 times weekly for the past year. she vomits up the stuck food/liquid or sometimes the stuck food/liquid/pill passes down if she waits a few minutes. Drinking water worsens makes this worse. She also has upper abdominal pain that occurs 2 to 3 times weekly and does not occur while eating. She stated having multiple EGDs with esophageal dilatations in her lifetime, first EGD was age 68. She stated having bleeding ulcers from taking oral iron for heavy menstrual bleeding 45 years ago. She takes Pantoprazole 40mg  every day and Famotidine 40mg  every day. She sometimes has lower abdominal pain. She has some nausea without vomiting. She has watery diarrhea once every month or two, sometimes bloody diarrhea. She typically passes pellet like or snake like brown stools every 5 to 6 days. She has rectal pain after passing a hard stool. She sometimes sees bright red blood on the stool and toilet tissue. She puts Miralax in her coffee every morning. She takes Senokot one to two tabs as needed. She underwent an EGD and colonoscopy 12/29/2015 at [facility], procedure reports are not accessible, however, path report showed chronic gastritis without evidence of H. Pylori and fragments of  ascending tubular adenomatous polyp. No known family history of colon cancer.      Latest Ref Rng & Units 03/27/2023    2:09 PM 01/02/2023    2:30 PM 12/08/2022   10:02 AM  CBC  WBC 4.0 - 10.5 K/uL 6.4  7.1  5.5   Hemoglobin 12.0 - 15.0 g/dL 40.9  81.1  9.4   Hematocrit 36.0 - 46.0 % 29.3  28.8  28.7   Platelets 150 - 400 K/uL 264  292  269.0     Labs 03/27/2023: Iron 116. TIBC 308. Saturation ratios 38. B12 level 344.      Latest Ref Rng & Units 03/27/2023    2:09 PM 01/02/2023    2:30 PM 12/08/2022   10:02 AM  CMP  Glucose 70 - 99 mg/dL 914  89  93   BUN 8 - 23 mg/dL 20  19  23    Creatinine 0.44 - 1.00 mg/dL 7.82  9.56  2.13   Sodium 135 - 145 mmol/L 140  138  140   Potassium 3.5 - 5.1 mmol/L 3.7  3.7  4.7   Chloride 98 - 111 mmol/L 107  103  103   CO2 22 - 32 mmol/L 28  21  29    Calcium 8.9 - 10.3 mg/dL 9.3  9.6  9.7   Total Protein 6.5 - 8.1 g/dL 7.2  8.0    Total Bilirubin <1.2 mg/dL 0.3  0.3    Alkaline Phos 38 - 126 U/L 83  80    AST 15 - 41 U/L 18  30    ALT 0 - 44 U/L 18  34  NM PET CT CARDIAC PERFUSION SCAN 12/27/2022:   LV perfusion is normal.   Rest left ventricular function is normal. Rest EF: 62%. Stress left ventricular function is normal. Stress EF: 71%. End diastolic cavity size is normal. End systolic cavity size is normal.   Myocardial blood flow was computed to be 0.43ml/g/min at rest and 2.23ml/g/min at stress. Global myocardial blood flow reserve was 2.78 and was normal.   Coronary calcium was present on the attenuation correction CT images. Minimal coronary calcifications were present. Coronary calcifications were present in the left anterior descending artery distribution(s).   The study is normal. The study is low risk.  PAST GI PROCEDURES:  EGD and colonoscopy 8/08/20217 at [facility], procedure reports not accessible.  Path report:  A. STOMACH, ENDOSCOPIC BIOPSY:              CHRONIC GASTRITIS.              NEGATIVE FOR HELICOBACTER ORGANISMS  PER THE SPECIAL STAIN.   B. LARGE INTESTINE, ASCENDING, ENDOSCOPIC BIOPSY:              FRAGMENTS OF TUBULAR ADENOMA.    Past Medical History:  Diagnosis Date   Allergy 1965   Nuts, trees, grass, bees, red ants   Anemia 1980   Arthritis 2007   GERD (gastroesophageal reflux disease)    Hypertension    Low iron    Ulcer 1981   Viral gastroenteritis 07/24/2022   Past Surgical History:  Procedure Laterality Date   ANTERIOR CRUCIATE LIGAMENT REPAIR     CHOLECYSTECTOMY     ENDOMETRIAL ABLATION     LEFT HEART CATHETERIZATION WITH CORONARY ANGIOGRAM N/A 08/27/2014   Procedure: LEFT HEART CATHETERIZATION WITH CORONARY ANGIOGRAM;  Surgeon: Iran Ouch, MD;  Location: [facility] CATH LAB;  Service: Cardiovascular;  Laterality: N/A;   TUBAL LIGATION  1980   Social History:  Widowed. She quit smoking cigarettes 11 years ago.  No alcohol since 09/2022 never drank heavily. She smokes Marijuana for degenerative joint disease related pain.   Family History: family history includes CAD (age of onset: 36) in her brother; CAD (age of onset: 64) in her father and sister; Cancer in her mother; Heart disease in her brother, brother, and father; Hypertension in her brother, brother, daughter, father, maternal aunt, and paternal uncle; Stroke in her maternal aunt. Niece had pancreatic cancer.    Outpatient Encounter Medications as of 04/06/2023  Medication Sig   amLODipine (NORVASC) 5 MG tablet TAKE 1 TABLET (5 MG TOTAL) BY MOUTH DAILY.   cyclobenzaprine (FLEXERIL) 5 MG tablet TAKE 1-2 TABLETS (5-10 MG TOTAL) BY MOUTH AT BEDTIME.   diclofenac Sodium (VOLTAREN) 1 % GEL APPLY 4 G TOPICALLY 4 (FOUR) TIMES DAILY AS NEEDED.   famotidine (PEPCID) 40 MG tablet Take 1 tablet (40 mg total) by mouth daily.   lidocaine (LIDODERM) 5 % PLEASE SEE ATTACHED FOR DETAILED DIRECTIONS   lisinopril (ZESTRIL) 10 MG tablet TAKE 1 TABLET BY MOUTH TWICE A DAY   montelukast (SINGULAIR) 10 MG tablet TAKE 1 TABLET BY MOUTH EVERYDAY  AT BEDTIME   pantoprazole (PROTONIX) 40 MG tablet TAKE 1 TABLET BY MOUTH EVERY DAY   polyethylene glycol (MIRALAX / GLYCOLAX) 17 g packet Take 17 g by mouth daily.   rosuvastatin (CRESTOR) 10 MG tablet TAKE 1 TABLET BY MOUTH EVERY DAY   No facility-administered encounter medications on file as of 04/06/2023.    REVIEW OF SYSTEMS:  Gen: Denies fever, sweats or chills. No weight  loss.  CV: Denies chest pain, palpitations or edema. Resp: Denies cough, shortness of breath of hemoptysis.  GI: See HPI.   GU: Denies urinary burning, blood in urine, increased urinary frequency or incontinence. MS: + Back pain.  Derm: Denies rash, itchiness, skin lesions or unhealing ulcers. Psych: Denies depression, anxiety, memory loss or confusion. Heme: Denies bruising, easy bleeding. Neuro:  Denies headaches, dizziness or paresthesias. Endo:  Denies any problems with DM, thyroid or adrenal function.  PHYSICAL EXAM: There were no vitals taken for this visit. BP 100/62   Pulse 89   Ht 5' 8.5" (1.74 m)   Wt 158 lb (71.7 kg)   BMI 23.67 kg/m   General: 62 year old female in no acute distress. Head: Normocephalic and atraumatic. Eyes:  Sclerae non-icteric, conjunctive pink. Ears: Normal auditory acuity. Mouth: Dentition intact. No ulcers or lesions.  Neck: Supple, no lymphadenopathy or thyromegaly.  Lungs: Clear bilaterally to auscultation without wheezes, crackles or rhonchi. Heart: Regular rate and rhythm. No murmur, rub or gallop appreciated.  Abdomen: Soft, nontender, nondistended. No masses. No hepatosplenomegaly. Normoactive bowel sounds x 4 quadrants.  Rectal: Patient declined exam, wishes to proceed with colonoscopy for further evaluation.  Musculoskeletal: Symmetrical with no gross deformities. Skin: Warm and dry. No rash or lesions on visible extremities. Extremities: No edema. Neurological: Alert oriented x 4, no focal deficits.  Psychological:  Alert and cooperative. Normal mood and  affect.  ASSESSMENT AND PLAN:  62 year old female with chronic GERD and dysphagia. Patient reported undergoing multiple EGDs with esophageal dilatations. Most recent EGD in 2017 showed chronic gastritis without evidence of H. Pylori. Remote history of a bleeding gastric ulcer age 28.  -EGD with possible esophageal dilatation benefits and risks discussed including risk with sedation, risk of bleeding, perforation and infection  -Increase Pantoprazole to 40mg  po bid -Famotidine 40mg  Q HS -GERD diet   Rectal pain, intermittent rectal bleeding  -Colonoscopy benefits and risks discussed including risk with sedation, risk of bleeding, perforation and infection   Constipation -Take Miralax 1 capful mixed in 8 ounces of water at bed time for constipation as tolerated. -Senokot 1 to 2 tabs Q HS PRN  History of colon polyps. Colonoscopy 12/29/2022 identified one tubular adenomatous polyp removed from the ascending colon. -Colonoscopy as ordered above   Anemia, chronic disease. Chronic GI blood loss likely a contributing factor.  -EGD and colonoscopy as ordered above    CC:  Garnette Gunner, MD

## 2023-04-06 NOTE — Patient Instructions (Addendum)
You have been scheduled for an endoscopy and colonoscopy. Please follow the written instructions given to you at your visit today.  Please pick up your prep supplies at the pharmacy within the next 1-3 days.  If you use inhalers (even only as needed), please bring them with you on the day of your procedure.  DO NOT TAKE 7 DAYS PRIOR TO TEST- Trulicity (dulaglutide) Ozempic, Wegovy (semaglutide) Mounjaro (tirzepatide) Bydureon Bcise (exanatide extended release)  DO NOT TAKE 1 DAY PRIOR TO YOUR TEST Rybelsus (semaglutide) Adlyxin (lixisenatide) Victoza (liraglutide) Byetta (exanatide) ___________________________________________________________________________  Continue Famotidine 40 mg- take 1 by mouth at bedtime  Miralax- every night as needed  Due to recent changes in healthcare laws, you may see the results of your imaging and laboratory studies on MyChart before your provider has had a chance to review them.  We understand that in some cases there may be results that are confusing or concerning to you. Not all laboratory results come back in the same time frame and the provider may be waiting for multiple results in order to interpret others.  Please give [place] 48 hours in order for your provider to thoroughly review all the results before contacting the office for clarification of your results.    Thank you for trusting me with your gastrointestinal care!   Alcide Evener, CRNP

## 2023-04-24 ENCOUNTER — Encounter: Payer: Self-pay | Admitting: Family Medicine

## 2023-04-25 ENCOUNTER — Telehealth: Payer: Self-pay | Admitting: Family Medicine

## 2023-04-25 ENCOUNTER — Encounter: Payer: Self-pay | Admitting: Gastroenterology

## 2023-04-25 NOTE — Telephone Encounter (Signed)
Unable to connect patient with the NT line, due to connectivity issues - stating "call cannot be completed as dialed."  Spoke with patient regarding symptoms. Pt is not currently dizzy at the time of this call. Pt reports this is the first occurrence of BP dropping this low. BP has come up this morning to 100/70. Pt reports drinking plenty of water. Pt currently takes amLODipine (NORVASC) 5 MG tablet and lisinopril (ZESTRIL) 10 MG tablet. I advised pt to hold medication for now until I get instructions from PCP whether to continue taking meds or not. Pt was offered an appt with another provider, but declines until she hears what Dr. Janee Morn advises her to do.

## 2023-04-25 NOTE — Telephone Encounter (Signed)
Instructions given. Pt expresses understanding. OV scheduled thurs 15/5/24 with PCP for re-evaluation.

## 2023-04-25 NOTE — Telephone Encounter (Signed)
Pt called with a BP of 87/42 asking if she should continue her BP med. I tried to connect with NT but received a recording saying I had dialed a non working number. I tried 3 times all with the same response.   I transferred the call to [facility].

## 2023-04-27 ENCOUNTER — Ambulatory Visit (INDEPENDENT_AMBULATORY_CARE_PROVIDER_SITE_OTHER): Payer: 59 | Admitting: Family Medicine

## 2023-04-27 VITALS — BP 110/78 | HR 85 | Temp 97.5°F | Wt 158.2 lb

## 2023-04-27 DIAGNOSIS — K219 Gastro-esophageal reflux disease without esophagitis: Secondary | ICD-10-CM

## 2023-04-27 DIAGNOSIS — I10 Essential (primary) hypertension: Secondary | ICD-10-CM | POA: Diagnosis not present

## 2023-04-27 DIAGNOSIS — I959 Hypotension, unspecified: Secondary | ICD-10-CM | POA: Diagnosis not present

## 2023-04-27 DIAGNOSIS — I7300 Raynaud's syndrome without gangrene: Secondary | ICD-10-CM

## 2023-04-27 DIAGNOSIS — M519 Unspecified thoracic, thoracolumbar and lumbosacral intervertebral disc disorder: Secondary | ICD-10-CM

## 2023-04-27 DIAGNOSIS — G8929 Other chronic pain: Secondary | ICD-10-CM

## 2023-04-27 DIAGNOSIS — R200 Anesthesia of skin: Secondary | ICD-10-CM

## 2023-04-27 DIAGNOSIS — M25562 Pain in left knee: Secondary | ICD-10-CM

## 2023-04-27 MED ORDER — PREDNISONE 50 MG PO TABS: ORAL_TABLET | ORAL | 0 refills | Status: DC

## 2023-04-27 NOTE — Progress Notes (Signed)
Assessment/Plan:   Problem List Items Addressed This Visit       Cardiovascular and Mediastinum   Primary hypertension   Hypotension    Episode of hypotension with BP 82/47?mmHg, associated with dizziness and presyncope. Intermittent chest pain described as pinching on the left side and occasional palpitations. Previous cardiac workup was stable.  Plan: Continue monitoring blood pressure at home. Encourage adequate hydration. Continue amlodipine 5?mg once daily. Hold lisinopril 10?mg until blood pressure stabilizes. Recommend re-evaluation by cardiology if symptoms persist or worsen. Return to clinic for reassessment in two months.         Digestive   GERD (gastroesophageal reflux disease)    Ongoing heartburn and nausea despite medication adjustments; endoscopy and colonoscopy scheduled with gastroenterology.  Plan: Continue pantoprazole 40?mg twice daily and famotidine 40?mg at bedtime. Encourage lifestyle modifications and avoidance of GERD triggers. 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   Plan:  Continue amlodipine 5 mg daily Patient to monitor symptoms      Lower extremity numbness - Primary    Worsening numbness and paresthesias in both feet over the past two weeks, with intermittent sharp pains in the left upper calf. Physical exam reveals decreased sensation in feet and decreased reflexes in the right leg. History of lumbar disc disease at L4-S1.  Plan: Initiate prednisone 50?mg orally once daily for 5 days to reduce inflammation. Refer to neurosurgery for evaluation of lumbar spine pathology. Continue pain management with cyclobenzaprine, Voltaren gel, and acetaminophen as needed. Advise monitoring for  worsening symptoms or new onset of weakness or incontinence. Return to clinic if symptoms worsen or new symptoms develop.      Chronic pain of left knee    History of left knee arthritis with current pain and tenderness. Plan: Continue use of Voltaren gel as needed. Encourage low-impact exercises as tolerated. Monitor symptoms and follow up as needed.      Relevant Medications   predniSONE (DELTASONE) 50 MG tablet    Medications Discontinued During This Encounter  Medication Reason   lisinopril (ZESTRIL) 10 MG tablet     No follow-ups on file.    Subjective:   Encounter date: 04/27/2023  Barbara [name] is a 62 y.o. female who has GERD (gastroesophageal reflux disease); Abnormal uterine bleeding (AUB); Primary hypertension; Lower back pain; Onychomycosis; Raynaud's phenomenon without gangrene; Syncope and collapse; Hypokalemia; Prolonged QT interval; Diarrhea; Iron deficiency anemia due to chronic blood loss; Tachycardia; Hemoglobin C trait (HCC); Deficiency anemia; Anemia, chronic disease; Bleeding hemorrhoid; Lower extremity numbness; Hypotension; Lumbosacral disc disease; and Chronic pain of left knee on their problem list..   She  has a past medical history of Allergy (1965), Anemia (1980), Arthritis (2007), GERD (gastroesophageal reflux disease), Hypertension, Low iron, Ulcer (1981), and Viral gastroenteritis (07/24/2022)..   Chief Complaint: Lower leg numbness, pain, and blood pressure concerns.  History of Present Illness:  Lower leg numbness: The numbness in the feet has been present for approximately two weeks, is constant, and worsening. It is described as pins and needles and is present in both feet, primarily from the heel to the ball of the foot. The patient also experiences sharp, electric shock-like pains in the upper calf of the left  leg, occurring intermittently without specific triggers. No falls or injuries endorsed. A prior MRI of the lumbar spine in September  2024 revealed bulging discs and spondylosis at L4-S1.  Hypotension: The patient reports an episode of hypotension at work, with blood pressure measured at 82/47?mmHg. During this episode, they felt the world spinning and experienced visual disturbances but did not lose consciousness. They also report intermittent chest pain described as a pinching sensation on the left side, occurring during the day and night without specific triggers. Occasional palpitations are noted. Denies shortness of breath. Currently on amlodipine 5?mg once daily for hypertension and Raynaud's phenomenon. The patient had previously been taking lisinopril 10?mg once daily but has held this medication since the hypotensive episode.  GERD: There is a history of heartburn, and the patient is scheduled for an endoscopy and colonoscopy next Friday. She takes pantoprazole 40 mg BID.    The patient has a history of left knee arthritis and underwent ACL repair at age 89.  Review of Systems:  General:  Skin: Feels cold, especially in extremities; no rashes or lesions. HEENT: Denies visual disturbances currently. Cardiovascular: Chest pain described as pinching on the left side; intermittent palpitations; denies shortness of breath. Respiratory: Denies shortness of breath. Gastrointestinal: Ongoing heartburn and nausea; denies vomiting. Genitourinary: Denies urinary symptoms. Musculoskeletal: Lower back pain, left knee pain, numbness, and pain in lower extremities. Neurological: Numbness and pins and needles sensation in both feet; intermittent sharp pains in left calf. Reports intermittent dizziness during hypotensive episodes; Intermittent headaches. Psychiatric: Denies psychiatric symptoms. Endocrine: Increased thirst. Hematologic/Lymphatic: Denies bleeding, bruising, or lymphadenopathy. All other systems reviewed and are negative.    Past Surgical History:  Procedure Laterality Date   ANTERIOR CRUCIATE LIGAMENT REPAIR      CHOLECYSTECTOMY     ENDOMETRIAL ABLATION     LEFT HEART CATHETERIZATION WITH CORONARY ANGIOGRAM N/A 08/27/2014   Procedure: LEFT HEART CATHETERIZATION WITH CORONARY ANGIOGRAM;  Surgeon: Iran Ouch, MD;  Location: [facility];  Service: Cardiovascular;  Laterality: N/A;   TUBAL LIGATION  1980    Outpatient Medications Prior to Visit  Medication Sig Dispense Refill   amLODipine (NORVASC) 5 MG tablet TAKE 1 TABLET (5 MG TOTAL) BY MOUTH DAILY. 90 tablet 0   cyclobenzaprine (FLEXERIL) 5 MG tablet TAKE 1-2 TABLETS (5-10 MG TOTAL) BY MOUTH AT BEDTIME. 30 tablet 1   diclofenac Sodium (VOLTAREN) 1 % GEL APPLY 4 G TOPICALLY 4 (FOUR) TIMES DAILY AS NEEDED. 100 g 3   famotidine (PEPCID) 40 MG tablet Take 1 tablet (40 mg total) by mouth daily. 90 tablet 1   lidocaine (LIDODERM) 5 % PLEASE SEE ATTACHED FOR DETAILED DIRECTIONS     montelukast (SINGULAIR) 10 MG tablet TAKE 1 TABLET BY MOUTH EVERYDAY AT BEDTIME 90 tablet 0   pantoprazole (PROTONIX) 40 MG tablet TAKE 1 TABLET BY MOUTH EVERY DAY 90 tablet 0   pantoprazole (PROTONIX) 40 MG tablet Take 1 tablet (40 mg total) by mouth 2 (two) times daily. Take 30 minutes before breakfast and dinner. 60 tablet 1   polyethylene glycol (MIRALAX / GLYCOLAX) 17 g packet Take 17 g by mouth daily.     rosuvastatin (CRESTOR) 10 MG tablet TAKE 1 TABLET BY MOUTH EVERY DAY 90 tablet 0   lisinopril (ZESTRIL) 10 MG tablet TAKE 1 TABLET BY MOUTH TWICE A DAY 180 tablet 1   No facility-administered medications prior to visit.    Family History  Problem Relation Age of Onset   Cancer Mother  CAD Father 67       Deceased in 7s   Heart disease Father    Hypertension Father    CAD Sister 19       1 stent   CAD Brother 62       CABG at age 20, and deceased   Heart disease Brother    Hypertension Brother    Heart disease Brother    Hypertension Brother    Hypertension Daughter    Hypertension Maternal Aunt    Stroke Maternal Aunt    Hypertension Paternal  Uncle     Social History   Socioeconomic History   Marital status: Divorced    Spouse name: Not on file   Number of children: 2   Years of education: Not on file   Highest education level: Some college, no degree  Occupational History   Occupation: Production designer, theatre/television/film  Tobacco Use   Smoking status: Former    Current packs/day: 0.00    Types: Cigarettes    Quit date: 08/27/2011    Years since quitting: 11.6    Passive exposure: Past   Smokeless tobacco: Never  Vaping Use   Vaping status: Never Used  Substance and Sexual Activity   Alcohol use: Yes    Comment: occasionally, glass of wine   Drug use: Yes    Types: Marijuana   Sexual activity: Yes    Birth control/protection: Surgical  Other Topics Concern   Not on file  Social History Narrative   Right handed   Drinks caffeine   Lives with brother 2nd floor apartment   employed   Social Determinants of Health   Financial Resource Strain: Low Risk  (04/27/2023)   Overall Financial Resource Strain (CARDIA)    Difficulty of Paying Living Expenses: Not very hard  Food Insecurity: No Food Insecurity (04/27/2023)   Hunger Vital Sign    Worried About Running Out of Food in the Last Year: Never true    Ran Out of Food in the Last Year: Never true  Transportation Needs: No Transportation Needs (04/27/2023)   PRAPARE - Administrator, Civil Service (Medical): No    Lack of Transportation (Non-Medical): No  Physical Activity: Sufficiently Active (04/27/2023)   Exercise Vital Sign    Days of Exercise per Week: 5 days    Minutes of Exercise per Session: 30 min  Stress: Stress Concern Present (04/27/2023)   [employer] of Occupational Health - Occupational Stress Questionnaire    Feeling of Stress : Rather much  Social Connections: Socially Isolated (04/27/2023)   Social Connection and Isolation Panel [NHANES]    Frequency of Communication with Friends and Family: Once a week    Frequency of Social Gatherings with Friends and  Family: Once a week    Attends Religious Services: 1 to 4 times per year    Active Member of [employer] or Organizations: No    Attends Banker Meetings: Not on file    Marital Status: Widowed  Intimate Partner Violence: Not At Risk (10/09/2022)   Humiliation, Afraid, Rape, and Kick questionnaire    Fear of Current or Ex-Partner: No    Emotionally Abused: No    Physically Abused: No    Sexually Abused: No  Objective:  Physical Exam: BP 110/78 (BP Location: Left Arm, Patient Position: Sitting, Cuff Size: Large)   Pulse 85   Temp (!) 97.5 F (36.4 C) (Temporal)   Wt 158 lb 3.2 oz (71.8 kg)   SpO2 99%   BMI 23.70 kg/m   Wt Readings from Last 3 Encounters:  04/27/23 158 lb 3.2 oz (71.8 kg)  04/06/23 158 lb (71.7 kg)  03/30/23 157 lb 12.8 oz (71.6 kg)     Physical Exam Constitutional:      General: She is not in acute distress.    Appearance: Normal appearance. She is not ill-appearing or toxic-appearing.  HENT:     Head: Normocephalic and atraumatic.     Nose: Nose normal. No congestion.  Eyes:     General: No scleral icterus.    Extraocular Movements: Extraocular movements intact.  Cardiovascular:     Rate and Rhythm: Normal rate and regular rhythm.     Pulses: Normal pulses.     Heart sounds: Normal heart sounds.  Pulmonary:     Effort: Pulmonary effort is normal. No respiratory distress.     Breath sounds: Normal breath sounds.  Abdominal:     General: Abdomen is flat. Bowel sounds are normal.     Palpations: Abdomen is soft.  Musculoskeletal:        General: Normal range of motion.     Left knee: Tenderness present over the lateral joint line.     Left lower leg: Tenderness present. No swelling.  Lymphadenopathy:     Cervical: No cervical adenopathy.  Skin:    General: Skin is warm and dry.     Findings: No rash.  Neurological:     General: No focal  deficit present.     Mental Status: She is alert and oriented to person, place, and time. Mental status is at baseline.     Sensory: Sensory deficit (Decreased sensation in feet from heel to ball bilaterally) present.     Motor: No weakness (5 of 5 strength in bilateral upper and lower extremities).     Deep Tendon Reflexes:     Reflex Scores:      Patellar reflexes are 1+ on the right side and 1+ on the left side. Psychiatric:        Mood and Affect: Mood normal.        Behavior: Behavior normal.        Thought Content: Thought content normal.        Judgment: Judgment normal.     MR LUMBAR SPINE WO CONTRAST  Result Date: 03/05/2023 CLINICAL DATA:  Low back pain radiating into both lower extremities for 2-3 months. No known injury or prior relevant surgery. EXAM: MRI LUMBAR SPINE WITHOUT CONTRAST TECHNIQUE: Multiplanar, multisequence MR imaging of the lumbar spine was performed. No intravenous contrast was administered. COMPARISON:  Abdominopelvic CT 01/02/2023. Lumbar MRI 02/22/2018. CT lumbar spine 01/27/2018. FINDINGS: Segmentation: Conventional anatomy assumed, with the last open disc space designated L5-S1.Concordant with prior imaging. Alignment: Mild convex right scoliosis. No focal angulation or listhesis. Vertebrae: Heterogeneous bone marrow edema without evidence of acute fracture, pars defect or aggressive osseous lesion. No evidence of discitis or osteomyelitis. Conus medullaris: Extends to the L1 level and appears normal. Paraspinal and other soft tissues: No significant paraspinal findings. Disc levels: Sagittal images demonstrate no significant disc space findings within the visualized lower thoracic spine. L1-2: Normal interspace. L2-3: Mild disc bulging and facet hypertrophy with preserved disc height. No spinal stenosis or nerve  root encroachment. L3-4: Mild disc bulging and facet hypertrophy with preserved disc height. No spinal stenosis or nerve root encroachment. L4-5: Chronic  loss of disc height with annular disc bulging and endplate osteophytes asymmetric to the right. Mild facet hypertrophy. The spinal canal, lateral recesses and left foramen are patent. There is mild right foraminal narrowing without nerve root encroachment. L5-S1: Mild chronic loss of disc height with annular disc bulging, endplate osteophytes and mild facet hypertrophy. Mild lateral recess and foraminal narrowing bilaterally without nerve root encroachment. IMPRESSION: 1. No acute findings or clear explanation for the patient's symptoms. 2. 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 There is mild lateral recess and foraminal narrowing bilaterally at L5-S1 and mild right foraminal narrowing at L4-5. No high-grade spinal stenosis or definite nerve root encroachment. Electronically Signed   By: Carey Bullocks M.D.   On: 03/05/2023 14:26    Recent Results (from the past 2160 hour(s))  Vitamin B12     Status: None   Collection Time: 03/27/23  2:09 PM  Result Value Ref Range   Vitamin B-12 344 180 - 914 pg/mL    Comment: (NOTE) This assay is not validated for testing neonatal or myeloproliferative syndrome specimens for Vitamin B12 levels. Performed at [facility], [address], [place], [place] [address]   Iron and Iron Binding Capacity ([facility] only)     Status: Abnormal   Collection Time: 03/27/23  2:09 PM  Result Value Ref Range   Iron 116 28 - 170 ug/dL   TIBC 621 308 - 657 ug/dL   Saturation Ratios 38 (H) 10.4 - 31.8 %   UIBC 192 148 - 442 ug/dL    Comment: Performed at [facility], [address] [place], [place] [address]  Sedimentation rate     Status: Abnormal   Collection Time: 03/27/23  2:09 PM  Result Value Ref Range   Sed Rate 38 (H) 0 - 22 mm/hr    Comment: Performed at [facility], [address] [place], [place] [address]  CBC with Differential (Cancer  Center Only)     Status: Abnormal   Collection Time: 03/27/23  2:09 PM  Result Value Ref Range   WBC Count 6.4 4.0 - 10.5 K/uL   RBC 3.62 (L) 3.87 - 5.11 MIL/uL   Hemoglobin 10.2 (L) 12.0 - 15.0 g/dL   HCT 41.3 (L) 24.4 - 01.0 %   MCV 80.9 80.0 - 100.0 fL   MCH 28.2 26.0 - 34.0 pg   MCHC 34.8 30.0 - 36.0 g/dL   RDW 27.2 53.6 - 64.4 %   Platelet Count 264 150 - 400 K/uL   nRBC 0.0 0.0 - 0.2 %   Neutrophils Relative % 52 %   Neutro Abs 3.3 1.7 - 7.7 K/uL   Lymphocytes Relative 40 %   Lymphs Abs 2.6 0.7 - 4.0 K/uL   Monocytes Relative 7 %   Monocytes Absolute 0.4 0.1 - 1.0 K/uL   Eosinophils Relative 1 %   Eosinophils Absolute 0.1 0.0 - 0.5 K/uL   Basophils Relative 0 %   Basophils Absolute 0.0 0.0 - 0.1 K/uL   Immature Granulocytes 0 %   Abs Immature Granulocytes 0.01 0.00 - 0.07 K/uL    Comment: Performed at [facility], [address] [place], [place] [address]  CMP (Cancer Center only)     Status: Abnormal   Collection Time: 03/27/23  2:09 PM  Result Value Ref Range   Sodium 140 135 - 145 mmol/L   Potassium 3.7 3.5 - 5.1 mmol/L   Chloride 107 98 - 111 mmol/L   CO2 28 22 - 32 mmol/L   Glucose, Bld 106 (H) 70 - 99 mg/dL    Comment: Glucose reference range applies only to samples taken after fasting for at least 8 hours.   BUN 20 8 - 23 mg/dL   Creatinine 1.61 (H) 0.96 - 1.00 mg/dL   Calcium 9.3 8.9 - 04.5 mg/dL   Total Protein 7.2 6.5 - 8.1 g/dL   Albumin 4.3 3.5 - 5.0 g/dL   AST 18 15 - 41 U/L   ALT 18 0 - 44 U/L   Alkaline Phosphatase 83 38 - 126 U/L   Total Bilirubin 0.3 <1.2 mg/dL   GFR, Estimated 58 (L) >60 mL/min    Comment: (NOTE) Calculated using the CKD-EPI Creatinine Equation (2021)    Anion gap 5 5 - 15    Comment: Performed at [facility], [address], [place], [place] [address]  Ferritin     Status: None   Collection Time: 03/27/23  2:12 PM  Result Value Ref Range   Ferritin 129 11 - 307 ng/mL     Comment: Performed at [facility], [address], [place], [place] [address]  ABO/Rh     Status: None   Collection Time: 03/27/23  2:12 PM  Result Value Ref Range   ABO/RH(D)      A POS Performed at [facility], [address] [place], [place] [address]         Garner Nash, MD, MS

## 2023-04-28 DIAGNOSIS — G8929 Other chronic pain: Secondary | ICD-10-CM | POA: Insufficient documentation

## 2023-04-28 DIAGNOSIS — R200 Anesthesia of skin: Secondary | ICD-10-CM | POA: Insufficient documentation

## 2023-04-28 DIAGNOSIS — I959 Hypotension, unspecified: Secondary | ICD-10-CM | POA: Insufficient documentation

## 2023-04-28 DIAGNOSIS — M519 Unspecified thoracic, thoracolumbar and lumbosacral intervertebral disc disorder: Secondary | ICD-10-CM | POA: Insufficient documentation

## 2023-04-28 NOTE — Assessment & Plan Note (Signed)
Episode of hypotension with BP 82/47?mmHg, associated with dizziness and presyncope. Intermittent chest pain described as pinching on the left side and occasional palpitations. Previous cardiac workup was stable.  Plan: Continue monitoring blood pressure at home. Encourage adequate hydration. Continue amlodipine 5?mg once daily. Hold lisinopril 10?mg until blood pressure stabilizes. Recommend re-evaluation by cardiology if symptoms persist or worsen. Return to clinic for reassessment in two months.

## 2023-04-28 NOTE — Assessment & Plan Note (Signed)
Ongoing heartburn and nausea despite medication adjustments; endoscopy and colonoscopy scheduled with gastroenterology.  Plan: Continue pantoprazole 40?mg twice daily and famotidine 40?mg at bedtime. Encourage lifestyle modifications and avoidance of GERD triggers. Continue to follow with gastroenterology

## 2023-04-28 NOTE — Assessment & Plan Note (Signed)
History of left knee arthritis with current pain and tenderness. Plan: Continue use of Voltaren gel as needed. Encourage low-impact exercises as tolerated. Monitor symptoms and follow up as needed.

## 2023-04-28 NOTE — Assessment & Plan Note (Signed)
Worsening numbness and paresthesias in both feet over the past two weeks, with intermittent sharp pains in the left upper calf. Physical exam reveals decreased sensation in feet and decreased reflexes in the right leg. History of lumbar disc disease at L4-S1.  Plan: Initiate prednisone 50?mg orally once daily for 5 days to reduce inflammation. Refer to neurosurgery for evaluation of lumbar spine pathology. Continue pain management with cyclobenzaprine, Voltaren gel, and acetaminophen as needed. Advise monitoring for worsening symptoms or new onset of weakness or incontinence. Return to clinic if symptoms worsen or new symptoms develop.

## 2023-04-28 NOTE — Assessment & Plan Note (Signed)
Stable.   Plan:  Continue amlodipine 5 mg daily Patient to monitor symptoms

## 2023-05-01 ENCOUNTER — Other Ambulatory Visit: Payer: Self-pay | Admitting: Family Medicine

## 2023-05-01 DIAGNOSIS — M545 Low back pain, unspecified: Secondary | ICD-10-CM

## 2023-05-05 ENCOUNTER — Encounter: Payer: Self-pay | Admitting: Gastroenterology

## 2023-05-05 ENCOUNTER — Ambulatory Visit: Payer: 59 | Admitting: Gastroenterology

## 2023-05-05 VITALS — BP 112/61 | HR 85 | Temp 98.1°F | Resp 12 | Ht 68.5 in | Wt 158.0 lb

## 2023-05-05 DIAGNOSIS — K219 Gastro-esophageal reflux disease without esophagitis: Secondary | ICD-10-CM

## 2023-05-05 DIAGNOSIS — K648 Other hemorrhoids: Secondary | ICD-10-CM

## 2023-05-05 DIAGNOSIS — D509 Iron deficiency anemia, unspecified: Secondary | ICD-10-CM | POA: Diagnosis not present

## 2023-05-05 DIAGNOSIS — R1310 Dysphagia, unspecified: Secondary | ICD-10-CM

## 2023-05-05 DIAGNOSIS — K625 Hemorrhage of anus and rectum: Secondary | ICD-10-CM

## 2023-05-05 DIAGNOSIS — D125 Benign neoplasm of sigmoid colon: Secondary | ICD-10-CM

## 2023-05-05 DIAGNOSIS — K222 Esophageal obstruction: Secondary | ICD-10-CM

## 2023-05-05 DIAGNOSIS — K635 Polyp of colon: Secondary | ICD-10-CM

## 2023-05-05 DIAGNOSIS — D121 Benign neoplasm of appendix: Secondary | ICD-10-CM | POA: Diagnosis not present

## 2023-05-05 DIAGNOSIS — D500 Iron deficiency anemia secondary to blood loss (chronic): Secondary | ICD-10-CM

## 2023-05-05 MED ORDER — SODIUM CHLORIDE 0.9 % IV SOLN: 500.0000 mL | INTRAVENOUS | Status: DC

## 2023-05-05 MED ORDER — HYDROCORTISONE ACETATE 25 MG RE SUPP: 25.0000 mg | Freq: Every day | RECTAL | 1 refills | Status: DC

## 2023-05-05 NOTE — Progress Notes (Signed)
Report to PACU, RN, vss, BBS= Clear.  

## 2023-05-05 NOTE — Patient Instructions (Addendum)
Resume all of your previous medications including your pantoprazole.  Take it twice daily on an empty stomach x 2 weeks.Take your sucralfate four times per day for 2 weeks.  Resume all of your previous medications as ordered. Read your discharge instructions  Follow antireflux regimen.  YOU HAD AN ENDOSCOPIC PROCEDURE TODAY AT THE [facility]:   Refer to the procedure report that was given to you for any specific questions about what was found during the examination.  If the procedure report does not answer your questions, please call your gastroenterologist to clarify.  If you requested that your care partner not be given the details of your procedure findings, then the procedure report has been included in a sealed envelope for you to review at your convenience later.  YOU SHOULD EXPECT: Some feelings of bloating in the abdomen. Passage of more gas than usual.  Walking can help get rid of the air that was put into your GI tract during the procedure and reduce the bloating. If you had a lower endoscopy (such as a colonoscopy or flexible sigmoidoscopy) you may notice spotting of blood in your stool or on the toilet paper. If you underwent a bowel prep for your procedure, you may not have a normal bowel movement for a few days.  Please Note:  You might notice some irritation and congestion in your nose or some drainage.  This is from the oxygen used during your procedure.  There is no need for concern and it should clear up in a day or so.  SYMPTOMS TO REPORT IMMEDIATELY:  Following lower endoscopy (colonoscopy or flexible sigmoidoscopy):  Excessive amounts of blood in the stool  Significant tenderness or worsening of abdominal pains  Swelling of the abdomen that is new, acute  Fever of 100F or higher  Following upper endoscopy (EGD)  Vomiting of blood or coffee ground material  New chest pain or pain under the shoulder blades  Painful or persistently difficult swallowing  New  shortness of breath  Fever of 100F or higher  Black, tarry-looking stools  For urgent or emergent issues, a gastroenterologist can be reached at any hour by calling [phone]. Do not use MyChart messaging for urgent concerns.    DIET:  We do recommend a small meal at first, but then you may proceed to your regular diet.  Drink plenty of fluids but you should avoid alcoholic beverages for 24 hours.  ACTIVITY:  You should plan to take it easy for the rest of today and you should NOT DRIVE or use heavy machinery until tomorrow (because of the sedation medicines used during the test).    FOLLOW UP: Our staff will call the number listed on your records the next business day following your procedure.  We will call around 7:15- 8:00 am to check on you and address any questions or concerns that you may have regarding the information given to you following your procedure. If we do not reach you, we will leave a message.     If any biopsies were taken you will be contacted by phone or by letter within the next 1-3 weeks.  Please call us at [phone] if you have not heard about the biopsies in 3 weeks.    SIGNATURES/CONFIDENTIALITY: You and/or your care partner have signed paperwork which will be entered into your electronic medical record.  These signatures attest to the fact that that the information above on your After Visit Summary has been reviewed and is  understood.  Full responsibility of the confidentiality of this discharge information lies with you and/or your care-partner.

## 2023-05-05 NOTE — Progress Notes (Signed)
Pt's states no medical or surgical changes since previsit or office visit. 

## 2023-05-05 NOTE — Progress Notes (Signed)
Called to room to assist during endoscopic procedure.  Patient ID and intended procedure confirmed with present staff. Received instructions for my participation in the procedure from the performing physician.  

## 2023-05-05 NOTE — Op Note (Signed)
[facility] Patient Name: Preet Harpham Procedure Date: 05/05/2023 2:49 PM MRN: [id number] Endoscopist: Napoleon Form , MD, [id number] Age: 62 Referring MD:  Date of Birth: 11-17-1960 Gender: Female Account #: [id number] Procedure:                Upper GI endoscopy Indications:              Dysphagia Medicines:                Monitored Anesthesia Care Procedure:                Pre-Anesthesia Assessment:                           - Prior to the procedure, a History and Physical                            was performed, and patient medications and                            allergies were reviewed. The patient's tolerance of                            previous anesthesia was also reviewed. The risks                            and benefits of the procedure and the sedation                            options and risks were discussed with the patient.                            All questions were answered, and informed consent                            was obtained. Prior Anticoagulants: The patient has                            taken no anticoagulant or antiplatelet agents. ASA                            Grade Assessment: II - A patient with mild systemic                            disease. After reviewing the risks and benefits,                            the patient was deemed in satisfactory condition to                            undergo the procedure.                           After obtaining informed consent, the endoscope was  passed under direct vision. Throughout the                            procedure, the patient's blood pressure, pulse, and                            oxygen saturations were monitored continuously. The                            Olympus Scope [id number] was introduced through the                            mouth, and advanced to the second part of duodenum.                            The upper GI endoscopy was  accomplished without                            difficulty. The patient tolerated the procedure                            well. Scope In: Scope Out: Findings:                 One benign-appearing, intrinsic mild stenosis was                            found 37 to 38 cm from the incisors. This stenosis                            measured less than one cm (in length). The stenosis                            was traversed. The scope was withdrawn. Dilation                            was performed with a Maloney dilator with mild                            resistance at 52 Fr. The dilation site was examined                            following endoscope reinsertion and showed mild                            mucosal disruption.                           The exam of the esophagus was otherwise normal.                           The stomach was normal.                           The cardia and gastric  fundus were normal on                            retroflexion.                           The examined duodenum was normal. Complications:            No immediate complications. Estimated Blood Loss:     Estimated blood loss was minimal. Impression:               - Benign-appearing esophageal stenosis. Dilated.                           - Normal stomach.                           - Normal examined duodenum.                           - No specimens collected. Recommendation:           - Patient has a contact number available for                            emergencies. The signs and symptoms of potential                            delayed complications were discussed with the                            patient. Return to normal activities tomorrow.                            Written discharge instructions were provided to the                            patient.                           - Resume previous diet.                           - Continue present medications.                           -  Follow an antireflux regimen.                           - Use Protonix (pantoprazole) 40 mg PO BID.                           - Use sucralfate suspension 1 gram PO QID for 2                            weeks. Napoleon Form, MD 05/05/2023 3:42:09 PM This report has been signed electronically.

## 2023-05-05 NOTE — Progress Notes (Signed)
[facility] History and Physical   Primary Care Physician:  Garnette Gunner, MD   Reason for Procedure:  GERD, dysphagia, rectal bleeding, iron deficiency  Plan:    EGD and colonoscopy with possible interventions as needed     HPI: Barbara [name] is a very pleasant 62 y.o. female here for EGD and colonoscopy for evaluation of GERD, dysphagia, iron deficiency and rectal bleeding.   The risks and benefits as well as alternatives of endoscopic procedure(s) have been discussed and reviewed. All questions answered. The patient agrees to proceed.    Past Medical History:  Diagnosis Date   Allergy 1965   Nuts, trees, grass, bees, red ants   Anemia 1980   Arthritis 2007   GERD (gastroesophageal reflux disease)    Hypertension    Low iron    Ulcer 1981   Viral gastroenteritis 07/24/2022    Past Surgical History:  Procedure Laterality Date   ANTERIOR CRUCIATE LIGAMENT REPAIR     CHOLECYSTECTOMY     ENDOMETRIAL ABLATION     LEFT HEART CATHETERIZATION WITH CORONARY ANGIOGRAM N/A 08/27/2014   Procedure: LEFT HEART CATHETERIZATION WITH CORONARY ANGIOGRAM;  Surgeon: Iran Ouch, MD;  Location: [facility] CATH LAB;  Service: Cardiovascular;  Laterality: N/A;   TUBAL LIGATION  1980    Prior to Admission medications   Medication Sig Start Date End Date Taking? Authorizing Provider  amLODipine (NORVASC) 5 MG tablet TAKE 1 TABLET (5 MG TOTAL) BY MOUTH DAILY. 03/06/23 06/04/23 Yes Garnette Gunner, MD  cyclobenzaprine (FLEXERIL) 5 MG tablet TAKE 1-2 TABLETS (5-10 MG TOTAL) BY MOUTH AT BEDTIME. 05/01/23  Yes Garnette Gunner, MD  famotidine (PEPCID) 40 MG tablet Take 1 tablet (40 mg total) by mouth daily. 12/28/22 06/26/23 Yes Garnette Gunner, MD  pantoprazole (PROTONIX) 40 MG tablet Take 1 tablet (40 mg total) by mouth 2 (two) times daily. Take 30 minutes before breakfast and dinner. 04/06/23  Yes Arnaldo Natal, NP  predniSONE (DELTASONE) 50 MG tablet Take 1 tablet  daily for 5 days. 04/27/23  Yes Garnette Gunner, MD  rosuvastatin (CRESTOR) 10 MG tablet TAKE 1 TABLET BY MOUTH EVERY DAY 03/16/23  Yes Garnette Gunner, MD  diclofenac Sodium (VOLTAREN) 1 % GEL APPLY 4 G TOPICALLY 4 (FOUR) TIMES DAILY AS NEEDED. 03/24/23   Garnette Gunner, MD  lidocaine (LIDODERM) 5 % PLEASE SEE ATTACHED FOR DETAILED DIRECTIONS 01/12/23   [provider]  montelukast (SINGULAIR) 10 MG tablet TAKE 1 TABLET BY MOUTH EVERYDAY AT BEDTIME 03/13/23   Garnette Gunner, MD  polyethylene glycol (MIRALAX / GLYCOLAX) 17 g packet Take 17 g by mouth daily.    [provider]    Current Outpatient Medications  Medication Sig Dispense Refill   amLODipine (NORVASC) 5 MG tablet TAKE 1 TABLET (5 MG TOTAL) BY MOUTH DAILY. 90 tablet 0   cyclobenzaprine (FLEXERIL) 5 MG tablet TAKE 1-2 TABLETS (5-10 MG TOTAL) BY MOUTH AT BEDTIME. 30 tablet 1   famotidine (PEPCID) 40 MG tablet Take 1 tablet (40 mg total) by mouth daily. 90 tablet 1   pantoprazole (PROTONIX) 40 MG tablet Take 1 tablet (40 mg total) by mouth 2 (two) times daily. Take 30 minutes before breakfast and dinner. 60 tablet 1   predniSONE (DELTASONE) 50 MG tablet Take 1 tablet daily for 5 days. 5 tablet 0   rosuvastatin (CRESTOR) 10 MG tablet TAKE 1 TABLET BY MOUTH EVERY DAY 90 tablet 0   diclofenac Sodium (VOLTAREN) 1 %  GEL APPLY 4 G TOPICALLY 4 (FOUR) TIMES DAILY AS NEEDED. 100 g 3   lidocaine (LIDODERM) 5 % PLEASE SEE ATTACHED FOR DETAILED DIRECTIONS     montelukast (SINGULAIR) 10 MG tablet TAKE 1 TABLET BY MOUTH EVERYDAY AT BEDTIME 90 tablet 0   polyethylene glycol (MIRALAX / GLYCOLAX) 17 g packet Take 17 g by mouth daily.     Current Facility-Administered Medications  Medication Dose Route Frequency Provider Last Rate Last Admin   0.9 %  sodium chloride infusion  500 mL Intravenous Continuous Mitchell Epling, Eleonore Chiquito, MD        Allergies as of 05/05/2023 - Review Complete 05/05/2023  Allergen Reaction Noted    Iodinated contrast media Anaphylaxis 12/13/2021   Iodine Swelling 12/03/2014   Morphine and codeine Itching 08/26/2014   Other Swelling 05/12/2017   Sulfa antibiotics Swelling 08/26/2014   Sulfamethoxazole-trimethoprim Other (See Comments) 10/27/2017    Family History  Problem Relation Age of Onset   Cancer Mother    CAD Father 29       Deceased in 15s   Heart disease Father    Hypertension Father    CAD Sister 47       1 stent   CAD Brother 57       CABG at age 75, and deceased   Heart disease Brother    Hypertension Brother    Heart disease Brother    Hypertension Brother    Hypertension Daughter    Hypertension Maternal Aunt    Stroke Maternal Aunt    Hypertension Paternal Uncle     Social History   Socioeconomic History   Marital status: Divorced    Spouse name: Not on file   Number of children: 2   Years of education: Not on file   Highest education level: Some college, no degree  Occupational History   Occupation: Production designer, theatre/television/film  Tobacco Use   Smoking status: Former    Current packs/day: 0.00    Types: Cigarettes    Quit date: 08/27/2011    Years since quitting: 11.6    Passive exposure: Past   Smokeless tobacco: Never  Vaping Use   Vaping status: Never Used  Substance and Sexual Activity   Alcohol use: Yes    Comment: occasionally, glass of wine   Drug use: Yes    Types: Marijuana    Comment: Last used November 2024   Sexual activity: Yes    Birth control/protection: Surgical  Other Topics Concern   Not on file  Social History Narrative   Right handed   Drinks caffeine   Lives with brother 2nd floor apartment   employed   Social Drivers of Health   Financial Resource Strain: Low Risk  (04/27/2023)   Overall Financial Resource Strain (CARDIA)    Difficulty of Paying Living Expenses: Not very hard  Food Insecurity: No Food Insecurity (04/27/2023)   Hunger Vital Sign    Worried About Running Out of Food in the Last Year: Never true    Ran Out of Food  in the Last Year: Never true  Transportation Needs: No Transportation Needs (04/27/2023)   PRAPARE - Administrator, Civil Service (Medical): No    Lack of Transportation (Non-Medical): No  Physical Activity: Sufficiently Active (04/27/2023)   Exercise Vital Sign    Days of Exercise per Week: 5 days    Minutes of Exercise per Session: 30 min  Stress: Stress Concern Present (04/27/2023)   [employer] of Occupational Health - Occupational  Stress Questionnaire    Feeling of Stress : Rather much  Social Connections: Socially Isolated (04/27/2023)   Social Connection and Isolation Panel [NHANES]    Frequency of Communication with Friends and Family: Once a week    Frequency of Social Gatherings with Friends and Family: Once a week    Attends Religious Services: 1 to 4 times per year    Active Member of [employer] or Organizations: No    Attends Banker Meetings: Not on file    Marital Status: Widowed  Intimate Partner Violence: Not At Risk (10/09/2022)   Humiliation, Afraid, Rape, and Kick questionnaire    Fear of Current or Ex-Partner: No    Emotionally Abused: No    Physically Abused: No    Sexually Abused: No    Review of Systems:  All other review of systems negative except as mentioned in the HPI.  Physical Exam: Vital signs in last 24 hours: BP 125/77   Pulse 99   Temp 98.1 F (36.7 C)   Ht 5' 8.5" (1.74 m)   Wt 158 lb (71.7 kg)   SpO2 100%   BMI 23.67 kg/m  General:   Alert, NAD Lungs:  Clear .   Heart:  Regular rate and rhythm Abdomen:  Soft, nontender and nondistended. Neuro/Psych:  Alert and cooperative. Normal mood and affect. A and O x 3  Reviewed labs, radiology imaging, old records and pertinent past GI work up  Patient is appropriate for planned procedure(s) and anesthesia in an ambulatory setting   K. Scherry Ran , MD [phone]

## 2023-05-05 NOTE — Op Note (Signed)
[facility] Patient Name: Barbara [name] Procedure Date: 05/05/2023 2:53 PM MRN: [id number] Endoscopist: Napoleon Form , MD, [id number] Age: 62 Referring MD:  Date of Birth: 1961/02/26 Gender: Female Account #: [id number] Procedure:                Colonoscopy Indications:              Evaluation of unexplained GI bleeding presenting                            with Hematochezia Medicines:                Monitored Anesthesia Care Procedure:                Pre-Anesthesia Assessment:                           - Prior to the procedure, a History and Physical                            was performed, and patient medications and                            allergies were reviewed. The patient's tolerance of                            previous anesthesia was also reviewed. The risks                            and benefits of the procedure and the sedation                            options and risks were discussed with the patient.                            All questions were answered, and informed consent                            was obtained. Prior Anticoagulants: The patient has                            taken no anticoagulant or antiplatelet agents. ASA                            Grade Assessment: II - A patient with mild systemic                            disease. After reviewing the risks and benefits,                            the patient was deemed in satisfactory condition to                            undergo the procedure.  After obtaining informed consent, the colonoscope                            was passed under direct vision. Throughout the                            procedure, the patient's blood pressure, pulse, and                            oxygen saturations were monitored continuously. The                            Olympus Scope [id number] was introduced through the                            anus and advanced to the the  cecum, identified by                            appendiceal orifice and ileocecal valve. The                            colonoscopy was performed without difficulty. The                            patient tolerated the procedure well. The quality                            of the bowel preparation was good. The ileocecal                            valve, appendiceal orifice, and rectum were                            photographed. Scope In: 3:15:14 PM Scope Out: 3:29:15 PM Scope Withdrawal Time: 0 hours 10 minutes 26 seconds  Total Procedure Duration: 0 hours 14 minutes 1 second  Findings:                 The perianal and digital rectal examinations were                            normal.                           A 1 mm polyp was found in the appendiceal orifice.                            The polyp was sessile. The polyp was removed with a                            cold biopsy forceps. Resection and retrieval were                            complete.  A 7 mm polyp was found in the sigmoid colon. The                            polyp was sessile. The polyp was removed with a                            cold snare. Resection and retrieval were complete.                           Non-bleeding internal hemorrhoids were found during                            retroflexion. The hemorrhoids were small. Complications:            No immediate complications. Estimated Blood Loss:     Estimated blood loss was minimal. Impression:               - One 1 mm polyp at the appendiceal orifice,                            removed with a cold biopsy forceps. Resected and                            retrieved.                           - One 7 mm polyp in the sigmoid colon, removed with                            a cold snare. Resected and retrieved.                           - Non-bleeding internal hemorrhoids. Recommendation:           - Patient has a contact number available for                             emergencies. The signs and symptoms of potential                            delayed complications were discussed with the                            patient. Return to normal activities tomorrow.                            Written discharge instructions were provided to the                            patient.                           - Resume previous diet.                           - Continue present  medications.                           - Await pathology results.                           - Repeat colonoscopy in 5-10 years for surveillance                            based on pathology results.                           - Use hydrocortisone suppository 25 mg 1 per rectum                            once a day at bedtime as needed                           - Will consider hemorrhoid band ligation if has                            persistent bleeding, please call to schedule Napoleon Form, MD 05/05/2023 3:38:48 PM This report has been signed electronically.

## 2023-05-08 ENCOUNTER — Telehealth: Payer: Self-pay | Admitting: *Deleted

## 2023-05-08 NOTE — Telephone Encounter (Signed)
  Follow up Call-     05/05/2023    2:23 PM  Call back number  Post procedure Call Back phone  # [phone]  Permission to leave phone message Yes     Patient questions:  Do you have a fever, pain , or abdominal swelling? No. Pain Score  0 *  Have you tolerated food without any problems? Yes.    Have you been able to return to your normal activities? Yes.    Do you have any questions about your discharge instructions: Diet   No. Medications  No. Follow up visit  No.  Do you have questions or concerns about your Care? No.  Actions: * If pain score is 4 or above: No action needed, pain <4.

## 2023-05-10 LAB — SURGICAL PATHOLOGY

## 2023-05-26 ENCOUNTER — Other Ambulatory Visit: Payer: Self-pay | Admitting: Nurse Practitioner

## 2023-06-01 ENCOUNTER — Other Ambulatory Visit: Payer: Self-pay | Admitting: Family Medicine

## 2023-06-01 DIAGNOSIS — I10 Essential (primary) hypertension: Secondary | ICD-10-CM

## 2023-06-04 ENCOUNTER — Encounter: Payer: Self-pay | Admitting: Gastroenterology

## 2023-06-05 ENCOUNTER — Other Ambulatory Visit: Payer: Self-pay | Admitting: Family Medicine

## 2023-06-05 DIAGNOSIS — K219 Gastro-esophageal reflux disease without esophagitis: Secondary | ICD-10-CM

## 2023-06-13 ENCOUNTER — Other Ambulatory Visit: Payer: Self-pay | Admitting: Family Medicine

## 2023-06-13 DIAGNOSIS — J302 Other seasonal allergic rhinitis: Secondary | ICD-10-CM

## 2023-06-21 ENCOUNTER — Other Ambulatory Visit: Payer: Self-pay | Admitting: Family Medicine

## 2023-06-21 DIAGNOSIS — E782 Mixed hyperlipidemia: Secondary | ICD-10-CM

## 2023-06-21 MED ORDER — ROSUVASTATIN CALCIUM 10 MG PO TABS: 10.0000 mg | ORAL_TABLET | Freq: Every day | ORAL | 0 refills | Status: AC

## 2023-06-22 ENCOUNTER — Encounter: Payer: Self-pay | Admitting: Internal Medicine

## 2023-06-22 ENCOUNTER — Ambulatory Visit: Payer: 59 | Attending: Internal Medicine | Admitting: Internal Medicine

## 2023-06-22 VITALS — BP 123/83 | HR 91 | Resp 14 | Ht 69.0 in | Wt 166.0 lb

## 2023-06-22 DIAGNOSIS — M25522 Pain in left elbow: Secondary | ICD-10-CM | POA: Diagnosis not present

## 2023-06-22 DIAGNOSIS — I7300 Raynaud's syndrome without gangrene: Secondary | ICD-10-CM | POA: Diagnosis not present

## 2023-06-22 DIAGNOSIS — R55 Syncope and collapse: Secondary | ICD-10-CM

## 2023-06-22 NOTE — Patient Instructions (Signed)
For osteoarthritis several treatments may be beneficial:  - Topical antiinflammatory medicine such as diclofenac or Voltaren can be applied to  affected area as needed. Topical analgesics containing CBD, menthol, or lidocaine can be tried. - Oral nonsteroidal antiinflammatory drugs (NSAIDs) such as ibuprofen, aleve, celebrex, or mobic are usually helpful for osteoarthritis. These should be taken intermittently or as needed. - Turmeric has some antiinflammatory effect similar to NSAIDs and may help, if taken as a supplement should not be taken above recommended doses.  - Compressive gloves or sleeve can be helpful to support the joint especially if hurting or swelling with certain activities. - Physical therapy referral can discuss exercises or activity modification to improve symptoms or strength if needed. - Local steroid injection is an option if symptoms become worse and not controlled by the above options.

## 2023-06-22 NOTE — Progress Notes (Signed)
Office Visit Note  Patient: Barbara [name]             Date of Birth: 07/09/1960           MRN: [id number]             PCP: Garnette Gunner, MD Referring: Garnette Gunner, MD Visit Date: 06/22/2023   Subjective:  Follow-up  Discussed the use of AI scribe software for clinical note transcription with the patient, who gave verbal consent to proceed.  History of Present Illness   Barbara [name] is a 63 y.o. female here for follow up for raynaud's syndrome on amlodipine 5 mg daily.  They experience symptoms of Raynaud's phenomenon, which have recently worsened, now affecting their toes in addition to their hands. The symptoms are exacerbated by cold weather, making it difficult to walk when the condition is active. No skin peeling, nail changes, or new rashes on their hands, feet, or legs.  They describe an episode of significant dizziness while at work, during which their blood pressure dropped to 66/44 mmHg. This prompted them to contact their healthcare provider, who advised discontinuing one of their blood pressure medications. They continue to take amlodipine for blood pressure management. Occasional dizziness is reported, but no leg swelling. During a recent colonoscopy, their blood pressure was noted to be normal.  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 The pain is located at the elbow, with no numbness reported. They also mention shoulder pain, particularly in the area of the biceps tendon, and note that they sometimes experience muscle bumps that they try to massage out. No specific activities exacerbate the pain, such as carrying heavy weights.   Previous HPI 01/18/23 [name] is a 63 y.o. female here for evaluation of Raynaud's syndrome with intermittent finger discoloration with associated coldness and numbness affecting both hands which is new during the past year.  She did not recall this problem before this past winter time.   Episodes usually occur when her hands get very cold although she has a history of cold sensation in her hands and feet going back many years.  She also notices episodes some of the time after eating meals is starts after completing the meal.  Typically symptoms last for about 15 to 20 minutes before normal color returning on its own with rewarming.  Her fingers are numb while discolored but has normal sensation the rest of the time.  She has photographs of the fingertips with pallor that we reviewed on cell phone today.  Outside of episodes has not noticed any skin changes no pitting ulcers scabs or other changes.  Her toes frequently get cold and numb as well but usually cannot see them due to foot wear so was not sure about discoloration. Lab test in primary care office in February were negative for ANA or serum inflammatory markers.  Due to the numbness sensations in her arm and leg went for nerve conduction study in June this was normal.  More recently also had a nuclear medicine cardiac CT study and a cervical spine MRI these were both normal and low risk.  She started on amlodipine currently taking 5 mg daily has not noticed any appreciable difference in symptoms with this.  Does not have lower extremity swelling. She is a former smoker but quit since around 10 years ago.  Has a distant family history of lupus no first-degree relatives with known autoimmune disease.  No personal history  of thyroid disease.  She was previously evaluated for blood clot with a positive D-dimer test checked during episode of chest pain but no clot identified.  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   06/2022 ANA neg ESR wnl CRP wnl   Imaging reviewed 12/26/22 MRI Cervical Spine IMPRESSION: 1. Normal MRI of the cervical cord. 2. Limited degenerative changes without impingement. 3. No explanation for extremity  symptoms.   12/27/22 NM PET CT  The study was normal.  The study is low risk. No significant incidental noncardiac findings are noted.   11/04/22 NCV with EMG Impression: This is a normal study of the right upper and lower extremities.  In particular, there is no evidence of a large fiber sensorimotor polyneuropathy, cervical/lumbosacral radiculopathy, or carpal tunnel syndrome.   Review of Systems  Constitutional:  Negative for fatigue.  HENT:  Negative for mouth sores and mouth dryness.   Eyes:  Positive for dryness.  Respiratory:  Negative for shortness of breath.   Cardiovascular:  Positive for chest pain and palpitations.  Gastrointestinal:  Positive for constipation and diarrhea. Negative for blood in stool.  Endocrine: Negative for increased urination.  Genitourinary:  Negative for involuntary urination.  Musculoskeletal:  Positive for joint pain, joint pain, myalgias, muscle tenderness and myalgias. Negative for gait problem, joint swelling, muscle weakness and morning stiffness.  Skin:  Positive for sensitivity to sunlight. Negative for color change, rash and hair loss.  Allergic/Immunologic: Positive for susceptible to infections.  Neurological:  Positive for dizziness. Negative for headaches.  Hematological:  Negative for swollen glands.  Psychiatric/Behavioral:  Negative for depressed mood and sleep disturbance. The patient is not nervous/anxious.     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   Deficiency anemia 01/09/2023   Iron deficiency anemia due to chronic blood loss 12/08/2022   Tachycardia 12/08/2022   Diarrhea 10/19/2022   Syncope and collapse 10/09/2022    Hypokalemia 10/09/2022   Prolonged QT interval 10/09/2022   Raynaud's phenomenon without gangrene 07/11/2022   Lower back pain 03/28/2022   Onychomycosis 03/28/2022   Primary hypertension 12/23/2021   Abnormal uterine bleeding (AUB) 11/25/2021   GERD (gastroesophageal reflux disease) 08/27/2014    Past Medical History:  Diagnosis Date   Allergy 1965   Nuts, trees, grass, bees, red ants   Anemia 1980   Arthritis 2007   GERD (gastroesophageal reflux disease)    Hypertension    Low iron    Ulcer 1981   Viral gastroenteritis 07/24/2022    Family History  Problem Relation Age of Onset   Cancer Mother    CAD Father 29       Deceased in 4s   Heart disease Father    Hypertension Father    CAD Sister 18       1 stent   CAD Brother 62       CABG at age 18, and deceased   Heart disease Brother    Hypertension Brother    Heart disease Brother    Hypertension Brother    Hypertension Daughter    Hypertension Maternal Aunt  Stroke Maternal Aunt    Hypertension Paternal Uncle    Past Surgical History:  Procedure Laterality Date   ANTERIOR CRUCIATE LIGAMENT REPAIR     CHOLECYSTECTOMY     ENDOMETRIAL ABLATION     LEFT HEART CATHETERIZATION WITH CORONARY ANGIOGRAM N/A 08/27/2014   Procedure: LEFT HEART CATHETERIZATION WITH CORONARY ANGIOGRAM;  Surgeon: Iran Ouch, MD;  Location: [facility];  Service: Cardiovascular;  Laterality: N/A;   TUBAL LIGATION  1980   Social History   Social History Narrative   Right handed   Drinks caffeine   Lives with brother 2nd floor apartment   employed   Immunization History  Administered Date(s) Administered   PFIZER(Purple Top)SARS-COV-2 Vaccination 08/15/2019, 09/09/2019     Objective: Vital Signs: BP 123/83 (BP Location: Left Arm, Patient Position: Sitting, Cuff Size: Large)   Pulse 91   Resp 14   Ht 5\' 9"  (1.753 m)   Wt 166 lb (75.3 kg)   BMI 24.51 kg/m    Physical Exam Eyes:     Conjunctiva/sclera: Conjunctivae  normal.  Cardiovascular:     Rate and Rhythm: Normal rate and regular rhythm.  Pulmonary:     Effort: Pulmonary effort is normal.     Breath sounds: Normal breath sounds.  Musculoskeletal:     Right lower leg: No edema.     Left lower leg: No edema.  Lymphadenopathy:     Cervical: No cervical adenopathy.  Skin:    General: Skin is warm and dry.     Findings: No rash.     Comments: No specific nailfold capillary changes  Neurological:     Mental Status: She is alert.  Psychiatric:        Mood and Affect: Mood normal.      Musculoskeletal Exam:  Neck full ROM no tenderness Left shoulder tenderness over biceps tendon Tenderness at left lateral epicondyle, pain on wrist flexion, extension, and rotation,  Wrists full ROM no tenderness or swelling Fingers full ROM no tenderness or swelling Knees full ROM no tenderness or swelling   Investigation: No additional findings.  Imaging: No results found.  Recent Labs: Lab Results  Component Value Date   WBC 6.4 03/27/2023   HGB 10.2 (L) 03/27/2023   PLT 264 03/27/2023   NA 140 03/27/2023   K 3.7 03/27/2023   CL 107 03/27/2023   CO2 28 03/27/2023   GLUCOSE 106 (H) 03/27/2023   BUN 20 03/27/2023   CREATININE 1.08 (H) 03/27/2023   BILITOT 0.3 03/27/2023   ALKPHOS 83 03/27/2023   AST 18 03/27/2023   ALT 18 03/27/2023   PROT 7.2 03/27/2023   ALBUMIN 4.3 03/27/2023   CALCIUM 9.3 03/27/2023   GFRAA >60 07/17/2018    Speciality Comments: No specialty comments available.  Procedures:  No procedures performed Allergies: Iodinated contrast media, Iodine, Morphine and codeine, Other, Sulfa antibiotics, and Sulfamethoxazole-trimethoprim   Assessment / Plan:     Visit Diagnoses: Raynaud's phenomenon without gangrene Symptoms affecting both fingers and toes, exacerbated by cold weather. Amlodipine appears to be providing some relief. -Continue Amlodipine 5 mg daily -Consider alternative medication maybe low dose nifedipine  or consider PDE5 inhibitor trial if recurrent hypotensive episodes occur.  Hypotension Episode of symptomatic hypotension with BP 66/44. Currently on Amlodipine for Raynaud's. No recurrent episodes reported. -Continue Amlodipine. -Consider alternative medication with less BP lowering effect if recurrent episodes occur.  Left elbow pain Biceps Tendonitis Pain localized to the biceps tendon, possibly related to previous injury. Lateral epicondyle  pain more suggestive problems coming from distal biceps tendon. -Printed information on stretching exercises. -Consider over-the-counter pain medication as needed. 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  Orders: No orders of the defined types were placed in this encounter.  No orders of the defined types were placed in this encounter.    Follow-Up Instructions: Return if symptoms worsen or fail to improve, for As needed for symptoms.   Fuller Plan, MD  Note - This record has been created using [employer].  Chart creation errors have been sought, but may not always  have been located. Such creation errors do not reflect on  the standard of medical care.

## 2023-07-01 ENCOUNTER — Other Ambulatory Visit: Payer: Self-pay | Admitting: Family Medicine

## 2023-07-01 DIAGNOSIS — M545 Low back pain, unspecified: Secondary | ICD-10-CM

## 2023-08-08 ENCOUNTER — Other Ambulatory Visit: Payer: Self-pay | Admitting: Family Medicine

## 2023-08-08 DIAGNOSIS — I10 Essential (primary) hypertension: Secondary | ICD-10-CM

## 2023-08-30 ENCOUNTER — Other Ambulatory Visit: Payer: Self-pay | Admitting: Family Medicine

## 2023-08-30 DIAGNOSIS — M545 Low back pain, unspecified: Secondary | ICD-10-CM

## 2023-09-10 ENCOUNTER — Other Ambulatory Visit: Payer: Self-pay | Admitting: Family Medicine

## 2023-09-10 DIAGNOSIS — J302 Other seasonal allergic rhinitis: Secondary | ICD-10-CM

## 2023-11-02 ENCOUNTER — Other Ambulatory Visit: Payer: Self-pay | Admitting: Family Medicine

## 2023-11-02 DIAGNOSIS — I10 Essential (primary) hypertension: Secondary | ICD-10-CM

## 2024-01-14 ENCOUNTER — Other Ambulatory Visit: Payer: Self-pay | Admitting: Family Medicine

## 2024-01-14 DIAGNOSIS — E782 Mixed hyperlipidemia: Secondary | ICD-10-CM

## 2024-02-02 ENCOUNTER — Encounter: Payer: Self-pay | Admitting: Family Medicine

## 2024-02-05 ENCOUNTER — Ambulatory Visit: Payer: Self-pay

## 2024-02-05 NOTE — Telephone Encounter (Signed)
 OV  scheduled with PCP 02/06/24.

## 2024-02-05 NOTE — Telephone Encounter (Signed)
 FYI Only or Action Required?: FYI only for provider.  Patient was last seen in primary care on 04/27/2023 by Sebastian Beverley NOVAK, MD.  Called Nurse Triage reporting Dizziness.  Symptoms began yesterday.  Interventions attempted: Rest, hydration, or home remedies.  Symptoms are: unchanged.  Triage Disposition: See Physician Within 24 Hours  Patient/caregiver understands and will follow disposition?: yes          Reason for Disposition  [1] MODERATE dizziness (e.g., interferes with normal activities) AND [2] has NOT been evaluated by doctor (or NP/PA) for this  (Exception: Dizziness caused by heat exposure, sudden standing, or poor fluid intake.)  Answer Assessment - Initial Assessment Questions 1. DESCRIPTION: Describe your dizziness.     Having to hold on to objects to maintain balance  2. LIGHTHEADED: Do you feel lightheaded? (e.g., somewhat faint, woozy, weak upon standing)     Dizziness sitting 3. VERTIGO: Do you feel like either you or the room is spinning or tilting? (i.e., vertigo)     yes 4. SEVERITY: How bad is it?  Do you feel like you are going to faint? Can you stand and walk?     Has to hold onto something to maintain balance  5. ONSET:  When did the dizziness begin?     Sat night  6. AGGRAVATING FACTORS: Does anything make it worse? (e.g., standing, change in head position)     standing 7. HEART RATE: Can you tell me your heart rate? How many beats in 15 seconds?  (Note: Not all patients can do this.)       BP 175/110 rechecked  8. CAUSE: What do you think is causing the dizziness? (e.g., decreased fluids or food, diarrhea, emotional distress, heat exposure, new medicine, sudden standing, vomiting; unknown)     Headache, worsening dizziness, dx 02/01/24  10. OTHER SYMPTOMS: Do you have any other symptoms? (e.g., fever, chest pain, vomiting, diarrhea, bleeding)       Diarrhea (watery) - coffee grounds in diarrhea, HTN  Protocols used:  Dizziness - Lightheadedness-A-AH

## 2024-02-05 NOTE — Telephone Encounter (Signed)
 MyChart message read by patient.

## 2024-02-05 NOTE — Telephone Encounter (Signed)
 First attempt; left vm.    Summary: Positive for Covid   Patient called in stating that she tested positive for covid, patient wants to know that if her urine is hot is she still running a fever. Patient said she still has a headache and slight dizziness, denies coughing.    CB: [phone]

## 2024-02-06 ENCOUNTER — Ambulatory Visit: Admitting: Family Medicine

## 2024-02-06 VITALS — BP 155/101 | HR 84 | Temp 97.0°F | Resp 18 | Wt 165.4 lb

## 2024-02-06 DIAGNOSIS — U071 COVID-19: Secondary | ICD-10-CM | POA: Diagnosis not present

## 2024-02-06 DIAGNOSIS — H672 Otitis media in diseases classified elsewhere, left ear: Secondary | ICD-10-CM | POA: Diagnosis not present

## 2024-02-06 DIAGNOSIS — I10 Essential (primary) hypertension: Secondary | ICD-10-CM

## 2024-02-06 MED ORDER — MECLIZINE HCL 25 MG PO TABS: 25.0000 mg | ORAL_TABLET | Freq: Three times a day (TID) | ORAL | 0 refills | Status: DC | PRN

## 2024-02-06 MED ORDER — AMLODIPINE BESYLATE 5 MG PO TABS: 5.0000 mg | ORAL_TABLET | Freq: Every day | ORAL | 3 refills | Status: AC

## 2024-02-06 NOTE — Progress Notes (Signed)
 Assessment & Plan   Assessment/Plan:     Assessment & Plan COVID-19 infection with associated serous otitis media, dizziness, diarrhea, and cough COVID-19 infection confirmed on February 01, 2024, with symptoms including dizziness, serous otitis media, diarrhea, and cough. Dizziness likely due to fluid in the middle ear, possibly exacerbated by dehydration from diarrhea. No acute changes in hearing, but decreased hearing noted. No ear pain or drainage. Cough and diarrhea are improving. Consideration of ear infection secondary to COVID-19, but unlikely. - Prescribe prednisone  50 mg daily for 5 days, to be taken with food in the morning. - Prescribe meclizine  25 mg every 8 hours as needed for dizziness, with caution for sedation. - Encourage hydration with fluids such as water, apple juice, or Gatorade. - Advise against antibiotics unless symptoms worsen or do not resolve with current treatment. - Monitor for any sharp ear pain or worsening symptoms and return for evaluation if needed.  Hypertension Hypertension managed with amlodipine . Blood pressure mildly elevated today, likely due to acute illness and dehydration. - Refill amlodipine  5 mg daily prescription. - Monitor blood pressure, especially after recovery from COVID-19.      Medications Discontinued During This Encounter  Medication Reason   hydrocortisone  (ANUSOL -HC) 25 MG suppository    cyclobenzaprine  (FLEXERIL ) 5 MG tablet    amLODipine  (NORVASC ) 5 MG tablet Reorder    Return if symptoms worsen or fail to improve.        Subjective:   Encounter date: 02/06/2024  Barbara [name] is a 63 y.o. female who has GERD (gastroesophageal reflux disease); Abnormal uterine bleeding (AUB); Primary hypertension; Lower back pain; Onychomycosis; Raynaud's phenomenon without gangrene; Syncope and collapse; Hypokalemia; Prolonged QT interval; Diarrhea; Iron deficiency anemia due to chronic blood loss; Tachycardia; Hemoglobin C trait  (HCC); Deficiency anemia; Anemia, chronic disease; Bleeding hemorrhoid; Lower extremity numbness; Hypotension; Lumbosacral disc disease; Chronic pain of left knee; and Pain in left elbow on their problem list..   She  has a past medical history of Allergy (1965), Anemia (1980), Arthritis (2007), GERD (gastroesophageal reflux disease) (1981), Hypertension, Low iron, Ulcer (1981), and Viral gastroenteritis (07/24/2022)..   She presents with chief complaint of Dizziness (Pt c/o of dizziness for 3 days. Pt was tested positive for COVID on 02/01/2024 and is having weakness with no taste or smell. ) .   Discussed the use of AI scribe software for clinical note transcription with the patient, who gave verbal consent to proceed.  History of Present Illness Barbara [name] is a 63 year old female who presents with ongoing dizziness following a recent COVID-19 infection.  Vertigo and disequilibrium - Dizziness ongoing for 3 days, described as sensation of 'fluid on the back of my ear' - Symptoms most noticeable when standing up - Requires holding onto walls for support while ambulating - Decrease in hearing, particularly in the left ear - No ear pain - Soft headache with pressure, no severe pain  Covid-19 infection and associated symptoms - Tested positive for COVID-19 on February 01, 2024 - Symptoms since diagnosis include weakness, loss of taste and smell, and persistent cough (improving) - No fever currently; considering return to work - No wheezing or shortness of breath  Gastrointestinal symptoms - Severe diarrhea initially, now improving - Initial stools described as 'coffee grind', now watery without blood - Actively maintaining hydration by drinking fluids  Upper respiratory symptoms - Persistent cough, currently improving - Chest pain associated with coughing, localized to upper back  Medication use and adherence - Did not take  Paxlovid due to cost - Unaware of prescribed  promethazine  for cough and has not taken it - Using Sudafed and Flonase for symptom management  Raynaud's phenomenon - History of Raynaud's syndrome affecting fingers, more pronounced on one hand     ROS  Past Surgical History:  Procedure Laterality Date   ANTERIOR CRUCIATE LIGAMENT REPAIR     CHOLECYSTECTOMY     ENDOMETRIAL ABLATION     LEFT HEART CATHETERIZATION WITH CORONARY ANGIOGRAM N/A 08/27/2014   Procedure: LEFT HEART CATHETERIZATION WITH CORONARY ANGIOGRAM;  Surgeon: Deatrice DELENA Cage, MD;  Location: [facility];  Service: Cardiovascular;  Laterality: N/A;   TUBAL LIGATION  1980    Outpatient Medications Prior to Visit  Medication Sig Dispense Refill   diclofenac  Sodium (VOLTAREN ) 1 % GEL APPLY 4 G TOPICALLY 4 (FOUR) TIMES DAILY AS NEEDED. 100 g 3   famotidine  (PEPCID ) 40 MG tablet TAKE 1 TABLET BY MOUTH EVERY DAY 90 tablet 1   lidocaine  (LIDODERM ) 5 % PLEASE SEE ATTACHED FOR DETAILED DIRECTIONS     montelukast  (SINGULAIR ) 10 MG tablet TAKE 1 TABLET BY MOUTH EVERYDAY AT BEDTIME 90 tablet 0   pantoprazole  (PROTONIX ) 40 MG tablet TAKE 1 TABLET (40 MG TOTAL) BY MOUTH 2 (TWO) TIMES DAILY. TAKE 30 MINUTES BEFORE BREAKFAST AND DINNER. 180 tablet 1   polyethylene glycol (MIRALAX / GLYCOLAX) 17 g packet Take 17 g by mouth daily.     promethazine -dextromethorphan (PROMETHAZINE -DM) 6.25-15 MG/5ML syrup Take 5 mLs by mouth.     rosuvastatin  (CRESTOR ) 10 MG tablet Take 1 tablet (10 mg total) by mouth daily. 90 tablet 0   amLODipine  (NORVASC ) 5 MG tablet TAKE 1 TABLET (5 MG TOTAL) BY MOUTH DAILY. 90 tablet 0   cyclobenzaprine  (FLEXERIL ) 5 MG tablet TAKE 1-2 TABLETS (5-10 MG TOTAL) BY MOUTH AT BEDTIME. (Patient not taking: Reported on 02/06/2024) 30 tablet 1   hydrocortisone  (ANUSOL -HC) 25 MG suppository Place 1 suppository (25 mg total) rectally daily. (Patient not taking: Reported on 02/06/2024) 12 suppository 1   No facility-administered medications prior to visit.    Family History   Problem Relation Age of Onset   Cancer Mother    CAD Father 64       Deceased in 18s   Heart disease Father    Hypertension Father    CAD Sister 14       1 stent   CAD Brother 35       CABG at age 74, and deceased   Heart disease Brother    Hypertension Brother    Heart disease Brother    Hypertension Brother    Hypertension Daughter    Hypertension Maternal Aunt    Stroke Maternal Aunt    Hypertension Paternal Uncle     Social History   Socioeconomic History   Marital status: Divorced    Spouse name: Not on file   Number of children: 2   Years of education: Not on file   Highest education level: Some college, no degree  Occupational History   Occupation: Production designer, theatre/television/film  Tobacco Use   Smoking status: Former    Current packs/day: 0.00    Types: Cigarettes    Quit date: 08/27/2011    Years since quitting: 12.4    Passive exposure: Past   Smokeless tobacco: Never  Vaping Use   Vaping status: Never Used  Substance and Sexual Activity   Alcohol use: Yes    Comment: occasionally, glass of wine   Drug use: Yes  Types: Marijuana    Comment: Last used November 2024   Sexual activity: Yes    Birth control/protection: Surgical  Other Topics Concern   Not on file  Social History Narrative   Right handed   Drinks caffeine   Lives with brother 2nd floor apartment   employed   Social Drivers of Health   Financial Resource Strain: Low Risk  (02/06/2024)   Overall Financial Resource Strain (CARDIA)    Difficulty of Paying Living Expenses: Not hard at all  Food Insecurity: No Food Insecurity (02/06/2024)   Hunger Vital Sign    Worried About Running Out of Food in the Last Year: Never true    Ran Out of Food in the Last Year: Never true  Transportation Needs: No Transportation Needs (02/06/2024)   PRAPARE - Administrator, Civil Service (Medical): No    Lack of Transportation (Non-Medical): No  Physical Activity: Sufficiently Active (02/06/2024)   Exercise Vital  Sign    Days of Exercise per Week: 5 days    Minutes of Exercise per Session: 30 min  Stress: Stress Concern Present (02/06/2024)   [employer] of Occupational Health - Occupational Stress Questionnaire    Feeling of Stress: To some extent  Social Connections: Moderately Isolated (02/06/2024)   Social Connection and Isolation Panel    Frequency of Communication with Friends and Family: More than three times a week    Frequency of Social Gatherings with Friends and Family: Twice a week    Attends Religious Services: More than 4 times per year    Active Member of [employer] or Organizations: No    Attends Banker Meetings: Not on file    Marital Status: Widowed  Intimate Partner Violence: Not At Risk (10/09/2022)   Humiliation, Afraid, Rape, and Kick questionnaire    Fear of Current or Ex-Partner: No    Emotionally Abused: No    Physically Abused: No    Sexually Abused: No                                                                                                  Objective:  Physical Exam: BP (!) 155/101 (BP Location: Left Arm, Patient Position: Sitting, Cuff Size: Large) Comment: patient machine reading in clinic  Pulse 84   Temp (!) 97 F (36.1 C) (Temporal)   Resp 18   Wt 165 lb 6.4 oz (75 kg)   SpO2 96%   BMI 24.43 kg/m    Orthostatic VS for the past 72 hrs (Last 3 readings):  Orthostatic BP Patient Position BP Location Cuff Size Orthostatic Pulse  02/06/24 1617 -- Sitting Left Arm Large --  02/06/24 1607 -- Standing Right Arm Normal --  02/06/24 1605 (!) 138/98 Sitting Left Arm Normal 81  02/06/24 1603 146/87 Supine Left Arm Normal 76   Wt Readings from Last 3 Encounters:  02/06/24 165 lb 6.4 oz (75 kg)  06/22/23 166 lb (75.3 kg)  05/05/23 158 lb (71.7 kg)     Physical Exam GENERAL: Alert, cooperative, well developed, no acute distress. HEENT: Normocephalic, normal oropharynx, moist  mucous membranes. Cerumen impaction on right and air fluid  level in the left ear with effusion CHEST: Clear to auscultation bilaterally. No wheezes, rhonchi, or crackles. CARDIOVASCULAR: Normal heart rate and rhythm, S1 and S2 normal without murmurs. ABDOMEN: Soft, non-tender, non-distended, without organomegaly. Normal bowel sounds. EXTREMITIES: No cyanosis or edema. NEUROLOGICAL: Cranial nerves grossly intact, moves all extremities without gross motor or sensory deficit.   Physical Exam  No results found.  No results found for this or any previous visit (from the past 2160 hours).      Beverley Adine Hummer, MD, MS

## 2024-02-22 ENCOUNTER — Encounter: Payer: Self-pay | Admitting: Family Medicine

## 2024-02-22 ENCOUNTER — Ambulatory Visit: Admitting: Family Medicine

## 2024-02-22 VITALS — BP 145/88 | HR 80 | Temp 97.8°F | Resp 18 | Wt 168.0 lb

## 2024-02-22 DIAGNOSIS — H672 Otitis media in diseases classified elsewhere, left ear: Secondary | ICD-10-CM

## 2024-02-22 DIAGNOSIS — U071 COVID-19: Secondary | ICD-10-CM

## 2024-02-22 DIAGNOSIS — J0100 Acute maxillary sinusitis, unspecified: Secondary | ICD-10-CM

## 2024-02-22 DIAGNOSIS — H66002 Acute suppurative otitis media without spontaneous rupture of ear drum, left ear: Secondary | ICD-10-CM

## 2024-02-22 MED ORDER — METHYLPREDNISOLONE ACETATE 80 MG/ML IJ SUSP
80.0000 mg | Freq: Once | INTRAMUSCULAR | Status: AC
  Administered 2024-02-22: 80 mg via INTRAMUSCULAR

## 2024-02-22 MED ORDER — KETOROLAC TROMETHAMINE 60 MG/2ML IM SOLN
60.0000 mg | Freq: Once | INTRAMUSCULAR | Status: AC
  Administered 2024-02-22: 60 mg via INTRAMUSCULAR

## 2024-02-22 MED ORDER — METHYLPREDNISOLONE SODIUM SUCC 125 MG IJ SOLR: 80.0000 mg | Freq: Once | INTRAMUSCULAR | Status: DC

## 2024-02-22 MED ORDER — CEFTRIAXONE SODIUM 1 G IJ SOLR
1.0000 g | Freq: Once | INTRAMUSCULAR | Status: AC
  Administered 2024-02-22: 1 g via INTRAMUSCULAR

## 2024-02-22 NOTE — Progress Notes (Signed)
 Assessment & Plan   Assessment/Plan:     Assessment & Plan Acute left otitis media with effusion and superimposed infection Persistent left ear pain with effusion and superimposed infection, initially treated with steroids and meclizine . Symptoms have worsened, with associated headache, facial pain, and dizziness. Examination reveals fluid and bubbles behind the tympanic membrane, indicating ongoing effusion and infection. The condition is likely a superinfection following COVID-19, requiring a change in treatment strategy. - Administer ceftriaxone  injection in-office to initiate treatment. - Prescribe Augmentin for 9 days, starting the day after ceftriaxone  injection, to address the infection. - Administer methylprednisolone  125 mg injection in-office to reduce inflammation. - Administer Toradol  60 mg injection in-office for pain relief. - Advise use of Tylenol  or ibuprofen  as needed for discomfort. - Recommend probiotics to prevent antibiotic-associated gastrointestinal issues. - Return if no improvement or worsening.   Acute sinusitis Symptoms suggestive of sinusitis, including facial pain, headache, and dental pain, likely secondary to the otitis media and effusion. The infection may have spread to the sinuses, contributing to the severity of symptoms. - Prescribe Augmentin for 9 days, which will also address sinusitis. - Consider CT scan if symptoms do not improve with current treatment.  Joint pain Reports of joint pain, particularly in the knee and hip, possibly related to systemic effects of the infection or residual effects of COVID-19. No fever or rash reported. Celebrex is considered due to her GERD and previous tolerance. - Prescribe Celebrex 200 mg twice a day as needed for joint pain.      Medications Discontinued During This Encounter  Medication Reason   methylPREDNISolone  sodium succinate (SOLU-MEDROL ) 125 mg/2 mL injection 80 mg              Subjective:    Encounter date: 02/22/2024  Larcenia Holaday is a 63 y.o. female who has GERD (gastroesophageal reflux disease); Abnormal uterine bleeding (AUB); Primary hypertension; Lower back pain; Onychomycosis; Raynaud's phenomenon without gangrene; Syncope and collapse; Hypokalemia; Prolonged QT interval; Diarrhea; Iron deficiency anemia due to chronic blood loss; Tachycardia; Hemoglobin C trait; Deficiency anemia; Anemia, chronic disease; Bleeding hemorrhoid; Lower extremity numbness; Hypotension; Lumbosacral disc disease; Chronic pain of left knee; and Pain in left elbow on their problem list..   She  has a past medical history of Allergy (1965), Anemia (1980), Arthritis (2007), GERD (gastroesophageal reflux disease) (1981), Hypertension, Low iron, Ulcer (1981), and Viral gastroenteritis (07/24/2022)..   She presents with chief complaint of Ear Pain (Pt c/o of left ear pain ) .   Discussed the use of AI scribe software for clinical note transcription with the patient, who gave verbal consent to proceed.  History of Present Illness Aneli Zara is a 63 year old female who presents with ongoing dizziness following a recent COVID-19 infection.  Vertigo and disequilibrium - Dizziness ongoing for 3 days, described as sensation of 'fluid on the back of my ear' - Symptoms most noticeable when standing up - Requires holding onto walls for support while ambulating - Decrease in hearing, particularly in the left ear - No ear pain - Soft headache with pressure, no severe pain  Covid-19 infection and associated symptoms - Tested positive for COVID-19 on February 01, 2024 - Symptoms since diagnosis include weakness, loss of taste and smell, and persistent cough (improving) - No fever currently; considering return to work - No wheezing or shortness of breath  Gastrointestinal symptoms - Severe diarrhea initially, now improving - Initial stools described as 'coffee grind', now watery without blood -  Actively maintaining  hydration by drinking fluids  Upper respiratory symptoms - Persistent cough, currently improving - Chest pain associated with coughing, localized to upper back  Medication use and adherence - Did not take Paxlovid due to cost - Unaware of prescribed promethazine  for cough and has not taken it - Using Sudafed and Flonase for symptom management  Raynaud's phenomenon - History of Raynaud's syndrome affecting fingers, more pronounced on one hand   Milagros Middendorf is a 63 year old female with serous otitis media who presents with worsening ear pain.  Otalgia and associated symptoms - Worsening left ear pain, initially localized, now rated 6/10 on the pain scale - Pain radiates to left-sided headache, teeth, eyes, and nose - Pain intensifies after eating - No fever or rash - History of eczema, currently not flared - Manages discomfort with Benadryl  and Tylenol   Dizziness - Dizziness present, improved with meclizine  use  Arthralgia - Joint pain, particularly in knee and hip joints - Previously used Celebrex for joint pain, which was better tolerated than other NSAIDs  Cold sensitivity and thermoregulation - Experiences cold sensitivity, alternating between wearing short and long sleeves  Respiratory symptoms - No shortness of breath - No persistent cough  Hydration status - Continues to stay hydrated  Gastroesophageal reflux disease - Takes pantoprazole  for GERD     ROS  Past Surgical History:  Procedure Laterality Date   ANTERIOR CRUCIATE LIGAMENT REPAIR     CHOLECYSTECTOMY     ENDOMETRIAL ABLATION     LEFT HEART CATHETERIZATION WITH CORONARY ANGIOGRAM N/A 08/27/2014   Procedure: LEFT HEART CATHETERIZATION WITH CORONARY ANGIOGRAM;  Surgeon: Deatrice DELENA Cage, MD;  Location: [facility];  Service: Cardiovascular;  Laterality: N/A;   TUBAL LIGATION  1980    Outpatient Medications Prior to Visit  Medication Sig Dispense Refill   amLODipine   (NORVASC ) 5 MG tablet Take 1 tablet (5 mg total) by mouth daily. 90 tablet 3   diclofenac  Sodium (VOLTAREN ) 1 % GEL APPLY 4 G TOPICALLY 4 (FOUR) TIMES DAILY AS NEEDED. 100 g 3   famotidine  (PEPCID ) 40 MG tablet TAKE 1 TABLET BY MOUTH EVERY DAY 90 tablet 1   lidocaine  (LIDODERM ) 5 % PLEASE SEE ATTACHED FOR DETAILED DIRECTIONS     meclizine  (ANTIVERT ) 25 MG tablet Take 1 tablet (25 mg total) by mouth 3 (three) times daily as needed for dizziness. 30 tablet 0   montelukast  (SINGULAIR ) 10 MG tablet TAKE 1 TABLET BY MOUTH EVERYDAY AT BEDTIME 90 tablet 0   pantoprazole  (PROTONIX ) 40 MG tablet TAKE 1 TABLET (40 MG TOTAL) BY MOUTH 2 (TWO) TIMES DAILY. TAKE 30 MINUTES BEFORE BREAKFAST AND DINNER. 180 tablet 1   polyethylene glycol (MIRALAX / GLYCOLAX) 17 g packet Take 17 g by mouth daily.     rosuvastatin  (CRESTOR ) 10 MG tablet Take 1 tablet (10 mg total) by mouth daily. 90 tablet 0   promethazine -dextromethorphan (PROMETHAZINE -DM) 6.25-15 MG/5ML syrup Take 5 mLs by mouth. (Patient not taking: Reported on 02/22/2024)     No facility-administered medications prior to visit.    Family History  Problem Relation Age of Onset   Cancer Mother    CAD Father 62       Deceased in 1s   Heart disease Father    Hypertension Father    CAD Sister 109       1 stent   CAD Brother 54       CABG at age 31, and deceased   Heart disease Brother    Hypertension  Brother    Heart disease Brother    Hypertension Brother    Hypertension Daughter    Hypertension Maternal Aunt    Stroke Maternal Aunt    Hypertension Paternal Uncle     Social History   Socioeconomic History   Marital status: Divorced    Spouse name: Not on file   Number of children: 2   Years of education: Not on file   Highest education level: Some college, no degree  Occupational History   Occupation: Production designer, theatre/television/film  Tobacco Use   Smoking status: Former    Current packs/day: 0.00    Types: Cigarettes    Quit date: 08/27/2011    Years since  quitting: 12.5    Passive exposure: Past   Smokeless tobacco: Never  Vaping Use   Vaping status: Never Used  Substance and Sexual Activity   Alcohol use: Yes    Comment: occasionally, glass of wine   Drug use: Yes    Types: Marijuana    Comment: Last used November 2024   Sexual activity: Yes    Birth control/protection: Surgical  Other Topics Concern   Not on file  Social History Narrative   Right handed   Drinks caffeine   Lives with brother 2nd floor apartment   employed   Social Drivers of Health   Financial Resource Strain: Low Risk  (02/06/2024)   Overall Financial Resource Strain (CARDIA)    Difficulty of Paying Living Expenses: Not hard at all  Food Insecurity: No Food Insecurity (02/06/2024)   Hunger Vital Sign    Worried About Running Out of Food in the Last Year: Never true    Ran Out of Food in the Last Year: Never true  Transportation Needs: No Transportation Needs (02/06/2024)   PRAPARE - Administrator, Civil Service (Medical): No    Lack of Transportation (Non-Medical): No  Physical Activity: Sufficiently Active (02/06/2024)   Exercise Vital Sign    Days of Exercise per Week: 5 days    Minutes of Exercise per Session: 30 min  Stress: Stress Concern Present (02/06/2024)   [employer] of Occupational Health - Occupational Stress Questionnaire    Feeling of Stress: To some extent  Social Connections: Moderately Isolated (02/06/2024)   Social Connection and Isolation Panel    Frequency of Communication with Friends and Family: More than three times a week    Frequency of Social Gatherings with Friends and Family: Twice a week    Attends Religious Services: More than 4 times per year    Active Member of [employer] or Organizations: No    Attends Banker Meetings: Not on file    Marital Status: Widowed  Intimate Partner Violence: Not At Risk (10/09/2022)   Humiliation, Afraid, Rape, and Kick questionnaire    Fear of Current or Ex-Partner:  No    Emotionally Abused: No    Physically Abused: No    Sexually Abused: No  Objective:  Physical Exam: BP (!) 145/88 (BP Location: Left Arm, Patient Position: Sitting, Cuff Size: Large)   Pulse 80   Temp 97.8 F (36.6 C) (Temporal)   Resp 18   Wt 168 lb (76.2 kg)   SpO2 100%   BMI 24.81 kg/m    No data found.  Wt Readings from Last 3 Encounters:  02/22/24 168 lb (76.2 kg)  02/06/24 165 lb 6.4 oz (75 kg)  06/22/23 166 lb (75.3 kg)     Physical Exam GENERAL: Alert, cooperative, well developed, no acute distress. HEENT: Normocephalic, normal oropharynx, moist mucous membranes. Cerumen impaction on right and air fluid level in the left ear with effusion CHEST: Clear to auscultation bilaterally. No wheezes, rhonchi, or crackles. CARDIOVASCULAR: Normal heart rate and rhythm, S1 and S2 normal without murmurs. ABDOMEN: Soft, non-tender, non-distended, without organomegaly. Normal bowel sounds. EXTREMITIES: No cyanosis or edema. NEUROLOGICAL: Cranial nerves grossly intact, moves all extremities without gross motor or sensory deficit.   GENERAL: Alert, cooperative, well developed, no acute distress. HEENT: Normocephalic, normal oropharynx, moist mucous membranes. Fluid with bubbles in left ear, exquisite tenderness in left ear, ear canal normal, effusion behind tympanic membrane. CHEST: Clear to auscultation bilaterally. No wheezes, rhonchi, or crackles. CARDIOVASCULAR: Normal heart rate and rhythm. S1 and S2 normal without murmurs. ABDOMEN: Soft, non-tender, non-distended, without organomegaly. Normal bowel sounds. EXTREMITIES: No cyanosis or edema. NEUROLOGICAL: Cranial nerves grossly intact. Moves all extremities without gross motor or sensory deficit.   Physical Exam  No results found.  No results found for this or any previous visit (from the past 2160 hours).       Beverley Adine Hummer, MD, MS

## 2024-02-23 MED ORDER — CELECOXIB 200 MG PO CAPS: 200.0000 mg | ORAL_CAPSULE | Freq: Two times a day (BID) | ORAL | 0 refills | Status: DC

## 2024-02-23 MED ORDER — AMOXICILLIN-POT CLAVULANATE 875-125 MG PO TABS
1.0000 | ORAL_TABLET | Freq: Two times a day (BID) | ORAL | 0 refills | Status: DC
Start: 2024-02-23 — End: 2024-04-04

## 2024-03-07 ENCOUNTER — Other Ambulatory Visit: Payer: Self-pay | Admitting: Family Medicine

## 2024-03-07 DIAGNOSIS — M545 Low back pain, unspecified: Secondary | ICD-10-CM

## 2024-03-12 ENCOUNTER — Ambulatory Visit: Payer: Self-pay

## 2024-03-12 ENCOUNTER — Ambulatory Visit: Admitting: Family

## 2024-03-12 VITALS — BP 137/86 | HR 73 | Temp 98.2°F | Resp 16 | Ht 68.3 in | Wt 169.0 lb

## 2024-03-12 DIAGNOSIS — N760 Acute vaginitis: Secondary | ICD-10-CM

## 2024-03-12 DIAGNOSIS — H6992 Unspecified Eustachian tube disorder, left ear: Secondary | ICD-10-CM | POA: Diagnosis not present

## 2024-03-12 MED ORDER — PREDNISONE 10 MG PO TABS: ORAL_TABLET | ORAL | 0 refills | Status: AC

## 2024-03-12 MED ORDER — FLUCONAZOLE 150 MG PO TABS: ORAL_TABLET | ORAL | 0 refills | Status: DC

## 2024-03-12 NOTE — Progress Notes (Signed)
 Subjective:     Patient ID: Barbara [name], female    DOB: 15-Nov-1960, 63 y.o.   MRN: [id number]  Chief Complaint  Patient presents with   Otalgia    Patient reports left ear pain since having covid in September    HPI  Discussed the use of AI scribe software for clinical note transcription with the patient, who gave verbal consent to proceed.  History of Present Illness  Barbara [name] is a 63 year old female who presents with persistent left ear pain following a COVID-19 infection. She is accompanied by her daughter, Toy. She reports persistent left ear pain since contracting COVID-19 in September. The pain is constant, varies in intensity, and worsens in cold weather. There is a sensation of being 'underwater' in the left ear, affecting her vertigo. Wearing glasses increases the pain due to pressure on the ear. Despite treatment with antibiotics including what sounded like an IM Rocephin  shot, reported IM steroid shot and a round of oral Augmentin, symptoms have only partially improved. She experiences sinus drainage. There is pressure and pain in the ear. She takes Benadryl  at night and Singulair  for allergies. She has not experienced a cough since recovering from COVID-19. She has a history of yeast infections following antibiotic use and requests Diflucan  to manage potential symptoms. + vaginal itching is reported.      Health Maintenance Due  Topic Date Due   HIV Screening  Never done   Hepatitis C Screening  Never done   DTaP/Tdap/Td (1 - Tdap) Never done   Zoster Vaccines- Shingrix (1 of 2) Never done   COVID-19 Vaccine (3 - Pfizer risk series) 10/07/2019   Pneumococcal Vaccine: 50+ Years (2 of 2 - PCV) 07/24/2020   Influenza Vaccine  Never done   Mammogram  01/12/2024    Past Medical History:  Diagnosis Date   Allergy 1965   Nuts, trees, grass, bees, red ants   Anemia 1980   Arthritis 2007   GERD (gastroesophageal reflux disease) 1981   Hypertension    Low iron     Ulcer 1981   Viral gastroenteritis 07/24/2022    Past Surgical History:  Procedure Laterality Date   ANTERIOR CRUCIATE LIGAMENT REPAIR     CHOLECYSTECTOMY     ENDOMETRIAL ABLATION     LEFT HEART CATHETERIZATION WITH CORONARY ANGIOGRAM N/A 08/27/2014   Procedure: LEFT HEART CATHETERIZATION WITH CORONARY ANGIOGRAM;  Surgeon: Deatrice DELENA Cage, MD;  Location: [facility];  Service: Cardiovascular;  Laterality: N/A;   TUBAL LIGATION  1980    Family History  Problem Relation Age of Onset   Cancer Mother    CAD Father 17       Deceased in 36s   Heart disease Father    Hypertension Father    CAD Sister 52       1 stent   CAD Brother 14       CABG at age 62, and deceased   Heart disease Brother    Hypertension Brother    Heart disease Brother    Hypertension Brother    Hypertension Daughter    Hypertension Maternal Aunt    Stroke Maternal Aunt    Hypertension Paternal Uncle     Social History   Socioeconomic History   Marital status: Divorced    Spouse name: Not on file   Number of children: 2   Years of education: Not on file   Highest education level: Associate degree: occupational, Scientist, product/process development, or vocational program  Occupational History   Occupation: Production designer, theatre/television/film  Tobacco Use   Smoking status: Former    Current packs/day: 0.00    Types: Cigarettes    Quit date: 08/27/2011    Years since quitting: 12.5    Passive exposure: Past   Smokeless tobacco: Never  Vaping Use   Vaping status: Never Used  Substance and Sexual Activity   Alcohol use: Yes    Comment: occasionally, glass of wine   Drug use: Yes    Types: Marijuana    Comment: Last used November 2024   Sexual activity: Yes    Birth control/protection: Surgical  Other Topics Concern   Not on file  Social History Narrative   Right handed   Drinks caffeine   Lives with brother 2nd floor apartment   employed   Social Drivers of Health   Financial Resource Strain: Low Risk  (03/12/2024)   Overall Financial  Resource Strain (CARDIA)    Difficulty of Paying Living Expenses: Not very hard  Food Insecurity: No Food Insecurity (03/12/2024)   Hunger Vital Sign    Worried About Running Out of Food in the Last Year: Never true    Ran Out of Food in the Last Year: Never true  Transportation Needs: No Transportation Needs (03/12/2024)   PRAPARE - Administrator, Civil Service (Medical): No    Lack of Transportation (Non-Medical): No  Physical Activity: Sufficiently Active (03/12/2024)   Exercise Vital Sign    Days of Exercise per Week: 5 days    Minutes of Exercise per Session: 60 min  Stress: Stress Concern Present (03/12/2024)   [employer] of Occupational Health - Occupational Stress Questionnaire    Feeling of Stress: Rather much  Social Connections: Moderately Integrated (03/12/2024)   Social Connection and Isolation Panel    Frequency of Communication with Friends and Family: More than three times a week    Frequency of Social Gatherings with Friends and Family: Once a week    Attends Religious Services: 1 to 4 times per year    Active Member of [employer] or Organizations: Yes    Attends Banker Meetings: 1 to 4 times per year    Marital Status: Widowed  Recent Concern: Social Connections - Moderately Isolated (02/06/2024)   Social Connection and Isolation Panel    Frequency of Communication with Friends and Family: More than three times a week    Frequency of Social Gatherings with Friends and Family: Twice a week    Attends Religious Services: More than 4 times per year    Active Member of [employer] or Organizations: No    Attends Banker Meetings: Not on file    Marital Status: Widowed  Intimate Partner Violence: Not At Risk (10/09/2022)   Humiliation, Afraid, Rape, and Kick questionnaire    Fear of Current or Ex-Partner: No    Emotionally Abused: No    Physically Abused: No    Sexually Abused: No    Outpatient Medications Prior to Visit   Medication Sig Dispense Refill   amLODipine  (NORVASC ) 5 MG tablet Take 1 tablet (5 mg total) by mouth daily. 90 tablet 3   amoxicillin-clavulanate (AUGMENTIN) 875-125 MG tablet Take 1 tablet by mouth 2 (two) times daily. 20 tablet 0   celecoxib (CELEBREX) 200 MG capsule Take 1 capsule (200 mg total) by mouth 2 (two) times daily. 30 capsule 0   diclofenac  Sodium (VOLTAREN ) 1 % GEL APPLY 4 G TOPICALLY 4 (FOUR) TIMES DAILY AS NEEDED.  100 g 3   famotidine  (PEPCID ) 40 MG tablet TAKE 1 TABLET BY MOUTH EVERY DAY 90 tablet 1   lidocaine  (LIDODERM ) 5 % PLEASE SEE ATTACHED FOR DETAILED DIRECTIONS     meclizine  (ANTIVERT ) 25 MG tablet Take 1 tablet (25 mg total) by mouth 3 (three) times daily as needed for dizziness. 30 tablet 0   montelukast  (SINGULAIR ) 10 MG tablet TAKE 1 TABLET BY MOUTH EVERYDAY AT BEDTIME 90 tablet 0   pantoprazole  (PROTONIX ) 40 MG tablet TAKE 1 TABLET (40 MG TOTAL) BY MOUTH 2 (TWO) TIMES DAILY. TAKE 30 MINUTES BEFORE BREAKFAST AND DINNER. 180 tablet 1   polyethylene glycol (MIRALAX / GLYCOLAX) 17 g packet Take 17 g by mouth daily.     rosuvastatin  (CRESTOR ) 10 MG tablet Take 1 tablet (10 mg total) by mouth daily. 90 tablet 0   promethazine -dextromethorphan (PROMETHAZINE -DM) 6.25-15 MG/5ML syrup Take 5 mLs by mouth. (Patient not taking: Reported on 02/22/2024)     No facility-administered medications prior to visit.    Allergies  Allergen Reactions   Iodinated Contrast Media Anaphylaxis    States he face swells when she has CT iodinated contrast   Iodine Swelling    Other reaction(s): Facial swelling   Morphine  And Codeine Itching   Other Swelling    All Nut Allergies   Sulfa Antibiotics Swelling   Sulfamethoxazole-Trimethoprim Other (See Comments)    ROS See HPI    Objective:    Physical Exam Constitutional:      General: She is not in acute distress.    Appearance: Normal appearance. She is well-developed.  HENT:     Head: Normocephalic and atraumatic.      Comments: Serous effusion left ear, no erythema, no bulging    Right Ear: External ear normal. There is impacted cerumen.     Left Ear: External ear normal.     Nose:     Right Sinus: Maxillary sinus tenderness present. No frontal sinus tenderness.     Left Sinus: Maxillary sinus tenderness present. No frontal sinus tenderness.  Eyes:     General: No scleral icterus. Neck:     Thyroid: No thyromegaly.  Cardiovascular:     Rate and Rhythm: Normal rate and regular rhythm.     Heart sounds: Normal heart sounds. No murmur heard. Pulmonary:     Effort: Pulmonary effort is normal. No respiratory distress.     Breath sounds: Normal breath sounds. No wheezing.  Musculoskeletal:     Cervical back: Neck supple.  Skin:    General: Skin is warm and dry.  Neurological:     Mental Status: She is alert and oriented to person, place, and time.  Psychiatric:        Mood and Affect: Mood normal.        Behavior: Behavior normal.        Thought Content: Thought content normal.        Judgment: Judgment normal.      BP 137/86 (BP Location: Right Arm, Patient Position: Sitting, Cuff Size: Normal)   Pulse 73   Temp 98.2 F (36.8 C) (Oral)   Resp 16   Ht 5' 8.3 (1.735 m)   Wt 169 lb (76.7 kg)   SpO2 100%   BMI 25.47 kg/m  Wt Readings from Last 3 Encounters:  03/12/24 169 lb (76.7 kg)  02/22/24 168 lb (76.2 kg)  02/06/24 165 lb 6.4 oz (75 kg)       Assessment & Plan:   Problem List  Items Addressed This Visit       Unprioritized   Eustachian tube dysfunction, left - Primary   Serous OM with suspected left eustachian tube dysfunction. Recommended addition of claritin, and a prednisone  taper.  If no improvement or if worsening, plan an additional round of antibiotics and sinus CT.  I am not convinced that she has a bacterial sinusitis at this point, but may have sinus pressure from congestion. Hopefully prednisone  will help promote drainage.      Relevant Medications   predniSONE   (DELTASONE ) 10 MG tablet   Other Visit Diagnoses       Acute vaginitis       Relevant Medications   fluconazole  (DIFLUCAN ) 150 MG tablet       I have discontinued Leeroy Puller promethazine -dextromethorphan. I am also having her start on predniSONE  and fluconazole . Additionally, I am having her maintain her polyethylene glycol, lidocaine , diclofenac  Sodium, pantoprazole , famotidine , rosuvastatin , montelukast , amLODipine , meclizine , celecoxib, and amoxicillin-clavulanate.  Meds ordered this encounter  Medications   predniSONE  (DELTASONE ) 10 MG tablet    Sig: 4 tabs by mouth once daily for 2 days, then 3 tabs daily x 2 days, then 2 tabs daily x 2 days, then 1 tab daily x 2 days    Dispense:  20 tablet    Refill:  0    Supervising Provider:   DOMENICA BLACKBIRD A [[id number]]   fluconazole  (DIFLUCAN ) 150 MG tablet    Sig: Take 1 tablet today, may repeat in 3 days if symptoms persist    Dispense:  2 tablet    Refill:  0    Supervising Provider:   DOMENICA BLACKBIRD A [[id number]]

## 2024-03-12 NOTE — Patient Instructions (Signed)
 VISIT SUMMARY:  During your visit, we discussed your persistent left ear pain, sinus congestion, and potential yeast infection following recent antibiotic use. We also addressed your ongoing brain fog after recovering from COVID-19.  YOUR PLAN:  LEFT EAR PAIN WITH PERSISTENT MIDDLE EAR EFFUSION: You have persistent left ear pain with clear fluid behind your eardrum, likely due to pressure from the fluid. There are no signs of infection currently. -Take a 5-day course of oral prednisone  to help drain the fluid from your inner ear. -Switch from Benadryl  to Zyrtec or Claritin once daily to help with any allergy-related issues.  SINUS CONGESTION, POSSIBLE SINUSITIS: Your current symptoms might be from congestion rather than an active infection. -Monitor your symptoms. If there is no improvement with the prednisone  please let us  know and we may start another round of antibiotics and order a CT scan of your sinuses. -Send a note via MyChart if your symptoms persist so we can manage them further.  VULVOVAGINAL CANDIDIASIS DUE TO ANTIBIOTICS: You likely have a yeast infection due to your recent use of antibiotics, specifically Augmentin. -Take Diflucan ; one dose today and repeat in three days if symptoms persist.

## 2024-03-12 NOTE — Assessment & Plan Note (Signed)
 Serous OM with suspected left eustachian tube dysfunction. Recommended addition of claritin, and a prednisone  taper.  If no improvement or if worsening, plan an additional round of antibiotics and sinus CT.  I am not convinced that she has a bacterial sinusitis at this point, but may have sinus pressure from congestion. Hopefully prednisone  will help promote drainage.

## 2024-03-12 NOTE — Telephone Encounter (Signed)
 FYI Only or Action Required?: FYI only for provider.  Patient was last seen in primary care on 02/22/2024 by Sebastian Beverley NOVAK, MD.  Called Nurse Triage reporting Otalgia. And also face pain  Symptoms began several weeks ago.  Interventions attempted: Prescription medications: as prescribed.  Symptoms are: gradually worsening.  Triage Disposition: See HCP Within 4 Hours (Or PCP Triage)  Patient/caregiver understands and will follow disposition?: Yes            Copied from CRM #[id number]. Topic: Clinical - Red Word Triage >> Mar 12, 2024  9:00 AM Frederich PARAS wrote: [facility] that prompted transfer to Nurse Triage: ear pain  pt calling she was told to call about her ear, left, alot of pain, she has been taking tylenol . She finished antibiotics pcp prescribed but when she wear glasses  it hurts around her nose. Reason for Disposition  [1] SEVERE pain (e.g., excruciating) and [2] not improved 2 hours after pain medicine (e.g., acetaminophen  or ibuprofen )  Answer Assessment - Initial Assessment Questions 1. LOCATION: Which ear is involved?     Left ear 2. ONSET: When did the ear pain start?      02/06/2024 3. SEVERITY: How bad is the pain?  (Scale 1-10; mild, moderate or severe)     Moderate -  4. URI SYMPTOMS: Do you have a runny nose or cough?     no 5. FEVER: Do you have a fever? If Yes, ask: What is your temperature, how was it measured, and when did it start?     no 6. CAUSE: Have you been swimming recently?, How often do you use Q-TIPS?, Have you had any recent air travel or scuba diving?     See in office of this 7. OTHER SYMPTOMS: Do you have any other symptoms? (e.g., decreased hearing, dizziness, headache, stiff neck, vomiting)     Dizziness  Protocols used: Earache-A-AH

## 2024-03-20 NOTE — Telephone Encounter (Signed)
 Contacted Ms Ekblad informing her Dr. Sebastian would per her to seen in person. Patient is scheduled to see Dr. Sebastian on 04/04/2024

## 2024-03-28 ENCOUNTER — Other Ambulatory Visit: Payer: Self-pay | Admitting: Medical Genetics

## 2024-04-04 ENCOUNTER — Ambulatory Visit: Admitting: Family Medicine

## 2024-04-04 ENCOUNTER — Encounter: Payer: Self-pay | Admitting: Family Medicine

## 2024-04-04 VITALS — BP 116/78 | HR 67 | Temp 98.1°F | Ht 68.0 in | Wt 170.4 lb

## 2024-04-04 DIAGNOSIS — H906 Mixed conductive and sensorineural hearing loss, bilateral: Secondary | ICD-10-CM | POA: Diagnosis not present

## 2024-04-04 DIAGNOSIS — I10 Essential (primary) hypertension: Secondary | ICD-10-CM

## 2024-04-04 DIAGNOSIS — H93A3 Pulsatile tinnitus, bilateral: Secondary | ICD-10-CM

## 2024-04-04 DIAGNOSIS — R42 Dizziness and giddiness: Secondary | ICD-10-CM

## 2024-04-04 DIAGNOSIS — Z006 Encounter for examination for normal comparison and control in clinical research program: Secondary | ICD-10-CM

## 2024-04-04 DIAGNOSIS — H9202 Otalgia, left ear: Secondary | ICD-10-CM | POA: Diagnosis not present

## 2024-04-04 DIAGNOSIS — R0683 Snoring: Secondary | ICD-10-CM

## 2024-04-04 DIAGNOSIS — M25552 Pain in left hip: Secondary | ICD-10-CM

## 2024-04-04 MED ORDER — CELECOXIB 200 MG PO CAPS: 200.0000 mg | ORAL_CAPSULE | Freq: Two times a day (BID) | ORAL | 3 refills | Status: AC

## 2024-04-04 MED ORDER — CIPROFLOXACIN-DEXAMETHASONE 0.3-0.1 % OT SUSP: 4.0000 [drp] | Freq: Two times a day (BID) | OTIC | 0 refills | Status: AC

## 2024-04-04 MED ORDER — TRAMADOL HCL 50 MG PO TABS: 50.0000 mg | ORAL_TABLET | Freq: Three times a day (TID) | ORAL | 0 refills | Status: AC | PRN

## 2024-04-04 NOTE — Patient Instructions (Addendum)
 It was very nice to see you today!  VISIT SUMMARY: During your visit, we discussed your persistent ear symptoms following a COVID infection, including hearing loss and tinnitus in your left ear, as well as vertigo and balance issues. We also reviewed your hip osteoarthritis, GERD, and suspected sleep apnea.  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 -You are referred to an ENT specialist for further evaluation and potential drainage. -Start using ciprofloxacin and dexamethasone  ear drops to reduce inflammation and infection. -After applying the ear drops, sit for five minutes to ensure they are absorbed properly.  VERTIGO: You are experiencing dizziness and balance problems, which are worsened by your ear issues. -You are prescribed tramadol  for neuropathic pain relief, to be taken at night. -Continue taking meclizine  as needed for vertigo.  HIP OSTEOARTHRITIS: You have chronic hip pain that is being managed with Celebrex. -Your prescription for Celebrex has been refilled for pain management.  GASTROESOPHAGEAL REFLUX DISEASE (GERD): You have GERD, which is being managed with pantoprazole . -Continue taking pantoprazole  to manage your GERD symptoms.  SUSPECTED SLEEP APNEA: You have symptoms that may indicate sleep apnea, such as feeling unable to breathe during sleep. -You are referred to sleep medicine for an evaluation and potential sleep study.  Return if symptoms worsen or fail to improve.   Take care, Arvella Hummer, MD, MS   PLEASE NOTE:  If you had any lab tests, please let us  know if you have not heard back within a few days. You may see your results on mychart before we have a chance to review them but we will give you a call once they are reviewed by us .   If we ordered any referrals today, please let us  know if  you have not heard from their office within the next week.   If you had any urgent prescriptions sent in today, please check with the pharmacy within an hour of our visit to make sure the prescription was transmitted appropriately.   Please try these tips to maintain a healthy lifestyle:  Eat at least 3 REAL meals and 1-2 snacks per day.  Aim for no more than 5 hours between eating.  If you eat breakfast, please do so within one hour of getting up.   Each meal should contain half fruits/vegetables, one quarter protein, and one quarter carbs (no bigger than a computer mouse)  Cut down on sweet beverages. This includes juice, soda, and sweet tea.   Drink at least 1 glass of water with each meal and aim for at least 8 glasses per day  Exercise at least 150 minutes every week.

## 2024-04-04 NOTE — Progress Notes (Signed)
 Assessment & Plan   Assessment/Plan:    Assessment and Plan Assessment & Plan Chronic left otitis media with hearing loss and tinnitus Chronic left otitis media with associated hearing loss and tinnitus, persisting since September post-COVID infection. Previous treatments with antibiotics and steroids have been ineffective. Symptoms include pulsatile tinnitus, hearing changes, and sensitivity to wind. No fever, chills, or drainage. Examination reveals no fluid, but previous redness and inflammation were noted. Differential includes possible nerve involvement or residual inflammation post-COVID. ENT referral is necessary for further evaluation and potential drainage. - Referred to ENT for further evaluation and potential drainage. - Prescribed ciprofloxacin and dexamethasone  ear drops to reduce inflammation and infection. - Advised to sit for five minutes post-application of ear drops to ensure absorption.  Vertigo Intermittent vertigo exacerbated by ear issues, causing dizziness and imbalance. Meclizine  provides some relief but causes drowsiness. Tramadol  considered for neuropathic pain relief. - Prescribed tramadol  for neuropathic pain relief, to be taken at night. - Continue meclizine  as needed for vertigo.  Hip osteoarthritis Chronic hip osteoarthritis managed with Celebrex, providing effective pain relief. - Refilled Celebrex for hip osteoarthritis pain management.  Gastroesophageal reflux disease (GERD) GERD managed with pantoprazole , effectively controlling symptoms. Celebrex use is limited to avoid gastrointestinal upset. - Continue pantoprazole  for GERD management.  Suspected sleep apnea Symptoms of sleep apnea, including episodes of feeling unable to breathe during sleep. GERD and ear inflammation may contribute to symptoms. - Referred to sleep medicine for evaluation and potential sleep study.         Medications Discontinued During This Encounter  Medication Reason    amoxicillin-clavulanate (AUGMENTIN) 875-125 MG tablet Completed Course   fluconazole  (DIFLUCAN ) 150 MG tablet    celecoxib (CELEBREX) 200 MG capsule Reorder    Return if symptoms worsen or fail to improve.        Subjective:   Encounter date: 04/04/2024  Barbara [name] is a 63 y.o. female who has GERD (gastroesophageal reflux disease); Abnormal uterine bleeding (AUB); Primary hypertension; Lower back pain; Onychomycosis; Raynaud's phenomenon without gangrene; Syncope and collapse; Hypokalemia; Prolonged QT interval; Diarrhea; Iron deficiency anemia due to chronic blood loss; Tachycardia; Hemoglobin C trait; Deficiency anemia; Anemia, chronic disease; Bleeding hemorrhoid; Lower extremity numbness; Hypotension; Lumbosacral disc disease; Chronic pain of left knee; Pain in left elbow; and Eustachian tube dysfunction, left on their problem list..   She  has a past medical history of Allergy (1965), Anemia (1980), Arthritis (2007), GERD (gastroesophageal reflux disease) (1981), Hypertension, Low iron, Ulcer (1981), and Viral gastroenteritis (07/24/2022)..   She presents with chief complaint of Follow up on left ear pain (Pt is here today to be seen for a follow up on left ear pain. She reports that she is still having intermittent severe pain. She says when the wind blows into it, it is worse.) Discussed the use of AI scribe software for clinical note transcription with the patient, who gave verbal consent to proceed.  History of Present Illness Barbara [name] is a 63 year old female who presents with persistent ear symptoms post-COVID infection.  Left ear symptoms - Persistent pulsatile tinnitus in the left ear since September, described as a 'heartbeat' sensation - Significant hearing loss in the left ear - Symptoms began following a COVID infection - No drainage from the ear - Occasional itching inside the left ear - Sharp pain in the left ear with wind exposure; uses a cap to cover the  ear for protection - No fever or chills  Vertigo and balance disturbance -  Vertigo episodes exacerbated by ear symptoms - Difficulty standing or lying down comfortably due to vertigo - Sensation of growing taller or standing on something during vertigo episodes, which can be debilitating - Meclizine  provides some relief without causing drowsiness  Treatment history and medication side effects - Multiple courses of antibiotics and steroids for ear symptoms without resolution - Development of a yeast infection as a side effect of antibiotics and steroids - Celebrex used as needed for hip joint pain with good effect and no gastrointestinal issues while on pantoprazole   Appetite and gastrointestinal symptoms - Intermittent appetite changes and stress - Stomach remains stable while taking pantoprazole  - No gastrointestinal issues while using Celebrex  Vital signs and general health - Blood pressure is stable  ROS  Past Surgical History:  Procedure Laterality Date   ANTERIOR CRUCIATE LIGAMENT REPAIR     CHOLECYSTECTOMY     ENDOMETRIAL ABLATION     LEFT HEART CATHETERIZATION WITH CORONARY ANGIOGRAM N/A 08/27/2014   Procedure: LEFT HEART CATHETERIZATION WITH CORONARY ANGIOGRAM;  Surgeon: Deatrice DELENA Cage, MD;  Location: [facility]ATH [facility];  Service: Cardiovascular;  Laterality: N/A;   TUBAL LIGATION  1980    Current Outpatient Medications on File Prior to Visit  Medication Sig Dispense Refill   amLODipine  (NORVASC ) 5 MG tablet Take 1 tablet (5 mg total) by mouth daily. 90 tablet 3   diclofenac  Sodium (VOLTAREN ) 1 % GEL APPLY 4 G TOPICALLY 4 (FOUR) TIMES DAILY AS NEEDED. 100 g 3   famotidine  (PEPCID ) 40 MG tablet TAKE 1 TABLET BY MOUTH EVERY DAY 90 tablet 1   lidocaine  (LIDODERM ) 5 % PLEASE SEE ATTACHED FOR DETAILED DIRECTIONS     meclizine  (ANTIVERT ) 25 MG tablet Take 1 tablet (25 mg total) by mouth 3 (three) times daily as needed for dizziness. 30 tablet 0   montelukast  (SINGULAIR ) 10 MG  tablet TAKE 1 TABLET BY MOUTH EVERYDAY AT BEDTIME 90 tablet 0   pantoprazole  (PROTONIX ) 40 MG tablet TAKE 1 TABLET (40 MG TOTAL) BY MOUTH 2 (TWO) TIMES DAILY. TAKE 30 MINUTES BEFORE BREAKFAST AND DINNER. 180 tablet 1   polyethylene glycol (MIRALAX / GLYCOLAX) 17 g packet Take 17 g by mouth daily.     predniSONE  (DELTASONE ) 10 MG tablet 4 tabs by mouth once daily for 2 days, then 3 tabs daily x 2 days, then 2 tabs daily x 2 days, then 1 tab daily x 2 days 20 tablet 0   rosuvastatin  (CRESTOR ) 10 MG tablet Take 1 tablet (10 mg total) by mouth daily. 90 tablet 0   No current facility-administered medications on file prior to visit.    Family History  Problem Relation Age of Onset   Cancer Mother    CAD Father 36       Deceased in 65s   Heart disease Father    Hypertension Father    CAD Sister 68       1 stent   CAD Brother 38       CABG at age 12, and deceased   Heart disease Brother    Hypertension Brother    Heart disease Brother    Hypertension Brother    Hypertension Daughter    Hypertension Maternal Aunt    Stroke Maternal Aunt    Hypertension Paternal Uncle     Social History   Socioeconomic History   Marital status: Divorced    Spouse name: Not on file   Number of children: 2   Years of education: Not on file  Highest education level: Associate degree: occupational, scientist, product/process development, or vocational program  Occupational History   Occupation: Production Designer, Theatre/television/film  Tobacco Use   Smoking status: Former    Current packs/day: 0.00    Types: Cigarettes    Quit date: 08/27/2011    Years since quitting: 12.6    Passive exposure: Past   Smokeless tobacco: Never  Vaping Use   Vaping status: Never Used  Substance and Sexual Activity   Alcohol use: Yes    Comment: occasionally, glass of wine   Drug use: Yes    Types: Marijuana    Comment: Last used November 2024   Sexual activity: Yes    Birth control/protection: Surgical  Other Topics Concern   Not on file  Social History Narrative    Right handed   Drinks caffeine   Lives with brother 2nd floor apartment   employed   Social Drivers of Health   Financial Resource Strain: Low Risk  (03/12/2024)   Overall Financial Resource Strain (CARDIA)    Difficulty of Paying Living Expenses: Not very hard  Food Insecurity: No Food Insecurity (03/12/2024)   Hunger Vital Sign    Worried About Running Out of Food in the Last Year: Never true    Ran Out of Food in the Last Year: Never true  Transportation Needs: No Transportation Needs (03/12/2024)   PRAPARE - Administrator, Civil Service (Medical): No    Lack of Transportation (Non-Medical): No  Physical Activity: Sufficiently Active (03/12/2024)   Exercise Vital Sign    Days of Exercise per Week: 5 days    Minutes of Exercise per Session: 60 min  Stress: Stress Concern Present (03/12/2024)   [employer] of Occupational Health - Occupational Stress Questionnaire    Feeling of Stress: Rather much  Social Connections: Moderately Integrated (03/12/2024)   Social Connection and Isolation Panel    Frequency of Communication with Friends and Family: More than three times a week    Frequency of Social Gatherings with Friends and Family: Once a week    Attends Religious Services: 1 to 4 times per year    Active Member of [employer] or Organizations: Yes    Attends Banker Meetings: 1 to 4 times per year    Marital Status: Widowed  Recent Concern: Social Connections - Moderately Isolated (02/06/2024)   Social Connection and Isolation Panel    Frequency of Communication with Friends and Family: More than three times a week    Frequency of Social Gatherings with Friends and Family: Twice a week    Attends Religious Services: More than 4 times per year    Active Member of [employer] or Organizations: No    Attends Banker Meetings: Not on file    Marital Status: Widowed  Intimate Partner Violence: Not At Risk (10/09/2022)   Humiliation, Afraid, Rape,  and Kick questionnaire    Fear of Current or Ex-Partner: No    Emotionally Abused: No    Physically Abused: No    Sexually Abused: No  Objective:  Physical Exam: BP 116/78 (BP Location: Left Arm, Cuff Size: Normal)   Pulse 67   Temp 98.1 F (36.7 C) (Oral)   Ht 5' 8 (1.727 m)   Wt 170 lb 6.4 oz (77.3 kg)   BMI 25.91 kg/m    Physical Exam           Physical Exam Constitutional:      General: She is not in acute distress.    Appearance: Normal appearance. She is not ill-appearing or toxic-appearing.  HENT:     Head: Normocephalic and atraumatic.     Nose: Nose normal. No congestion.  Eyes:     General: No scleral icterus.    Extraocular Movements: Extraocular movements intact.  Cardiovascular:     Rate and Rhythm: Normal rate and regular rhythm.     Pulses: Normal pulses.     Heart sounds: Normal heart sounds.  Pulmonary:     Effort: Pulmonary effort is normal. No respiratory distress.     Breath sounds: Normal breath sounds.  Abdominal:     General: Abdomen is flat. Bowel sounds are normal.     Palpations: Abdomen is soft.  Musculoskeletal:        General: Normal range of motion.  Lymphadenopathy:     Cervical: No cervical adenopathy.  Skin:    General: Skin is warm and dry.     Findings: No rash.  Neurological:     General: No focal deficit present.     Mental Status: She is alert and oriented to person, place, and time. Mental status is at baseline.  Psychiatric:        Mood and Affect: Mood normal.        Behavior: Behavior normal.        Thought Content: Thought content normal.        Judgment: Judgment normal.     No results found.  Recent Results (from the past 2160 hours)  [facility] Molecular Screen - Blood     Status: None (In process)   Collection Time: 04/04/24  2:09 PM  Result Value Ref Range   [facility] Molecular Screen-Blood Received          Beverley KATHEE Hummer, MD  I,Emily Lagle,acting as a scribe for Beverley KATHEE Hummer, MD.,have documented all relevant documentation on the behalf of Beverley KATHEE Hummer, MD.  LILLETTE Beverley KATHEE Hummer, MD, have reviewed all documentation for this visit. The documentation on 04/04/2024 for the exam, diagnosis, procedures, and orders are all accurate and complete.

## 2024-04-11 ENCOUNTER — Other Ambulatory Visit: Payer: Self-pay | Admitting: Family Medicine

## 2024-04-11 DIAGNOSIS — H672 Otitis media in diseases classified elsewhere, left ear: Secondary | ICD-10-CM

## 2024-04-13 ENCOUNTER — Other Ambulatory Visit: Payer: Self-pay | Admitting: Family Medicine

## 2024-04-13 DIAGNOSIS — K219 Gastro-esophageal reflux disease without esophagitis: Secondary | ICD-10-CM

## 2024-04-15 LAB — GENECONNECT MOLECULAR SCREEN: Genetic Analysis Overall Interpretation: NEGATIVE

## 2024-04-16 ENCOUNTER — Other Ambulatory Visit (HOSPITAL_COMMUNITY)

## 2024-04-29 ENCOUNTER — Telehealth (HOSPITAL_BASED_OUTPATIENT_CLINIC_OR_DEPARTMENT_OTHER): Payer: Self-pay

## 2024-04-29 NOTE — Telephone Encounter (Signed)
 Left voicemail for patient regarding her 9:30am appointment on 12/9. [facility] is functioning under a 2 hour delay given the poor weather conditions.Please reschedule patient to later in the day if possible.

## 2024-04-30 ENCOUNTER — Ambulatory Visit (HOSPITAL_BASED_OUTPATIENT_CLINIC_OR_DEPARTMENT_OTHER)

## 2024-05-01 ENCOUNTER — Encounter (HOSPITAL_BASED_OUTPATIENT_CLINIC_OR_DEPARTMENT_OTHER): Payer: Self-pay

## 2024-05-01 ENCOUNTER — Ambulatory Visit (INDEPENDENT_AMBULATORY_CARE_PROVIDER_SITE_OTHER)

## 2024-05-01 VITALS — BP 136/86 | HR 84 | Ht 68.0 in | Wt 169.0 lb

## 2024-05-01 DIAGNOSIS — G4710 Hypersomnia, unspecified: Secondary | ICD-10-CM | POA: Diagnosis not present

## 2024-05-01 DIAGNOSIS — G4719 Other hypersomnia: Secondary | ICD-10-CM

## 2024-05-01 DIAGNOSIS — R5383 Other fatigue: Secondary | ICD-10-CM | POA: Diagnosis not present

## 2024-05-01 DIAGNOSIS — Z87891 Personal history of nicotine dependence: Secondary | ICD-10-CM | POA: Diagnosis not present

## 2024-05-01 DIAGNOSIS — R0683 Snoring: Secondary | ICD-10-CM | POA: Diagnosis not present

## 2024-05-01 NOTE — Patient Instructions (Signed)
 Complete home sleep test as ordered.   Follow sleep hygiene as discussed.

## 2024-05-01 NOTE — Progress Notes (Signed)
 Epworth Sleepiness Scale  Use the following scale to choose the most appropriate number for each situation. 0 Would never nod off 1  Slight  chance of nodding off 2 Moderate chance of nodding off 3 High chance of nodding off  Sitting and reading: Watching TV: 2  Sitting, inactive, in a public place (e.g., in a meeting, theater, or dinner event):3 As a passenger in a car for an hour or more without stopping for a break: 0 Lying down to rest when circumstances permit:0 Sitting and talking to someone: 0 Sitting quietly after a meal without alcohol: 0 In a car, while stopped for a few  minutes in traffic or at a light: 0  TOTOAL:5

## 2024-05-01 NOTE — Progress Notes (Signed)
 @Patient  ID: Barbara [name], female    DOB: [birth year], 63 y.o.   MRN: [id number]  Chief Complaint  Patient presents with   Establish Care    New sleep     Referring provider: Sebastian Beverley NOVAK, MD  HPI: Discussed the use of AI scribe software for clinical note transcription with the patient, who gave verbal consent to proceed.  History of Present Illness Barbara [name] is a 63 year old female with mild sleep apnea who presents with sleep disturbances and daytime fatigue.  She experiences frequent awakenings at night due to hot flashes associated with menopause and episodes of sleep paralysis, during which she cannot move and sometimes struggles to breathe. Her daughter has observed episodes of apnea during sleep, noting that she stops breathing at times.  She feels tired upon waking and often does not want to get up, indicating significant daytime fatigue. She does not take naps during the day but can fall asleep easily when watching TV at night. She goes to bed between 9 and 10 PM but takes about an hour to fall asleep, often using her tablet during this time. She uses Tylenol  PM to help relax and fall asleep, but still wakes up multiple times during the night, not related to bathroom needs but more due to breathing difficulties.  She had a sleep study approximately 16 years ago in [place], [place] , which indicated a mild case of sleep apnea.  The test results are not available for review.  She has a history of high blood pressure and irregular heart rhythms, possibly related to potassium fluctuations. No history of heart attack, stroke, or heart failure. She experiences some shortness of breath, especially after having COVID in September, but remains active, walking her dog and moving around at work.  She has a family history of sleep apnea, with a brother who passed away from the condition three years ago.  She is anemic, with fluctuating hemoglobin levels around 9 g/dL, and  has difficulty absorbing oral iron, previously requiring iron infusions. Her B12 levels are normal. She uses montelukast  as needed for allergies.   TEST/EVENTS :  epworth: 5  Allergies  Allergen Reactions   Iodinated Contrast Media Anaphylaxis    States he face swells when she has CT iodinated contrast   Iodine Swelling    Other reaction(s): Facial swelling   Morphine  And Codeine Itching   Other Swelling    All Nut Allergies   Sulfa Antibiotics Swelling   Sulfamethoxazole-Trimethoprim Other (See Comments)    Immunization History  Administered Date(s) Administered   PFIZER(Purple Top)SARS-COV-2 Vaccination 08/15/2019, 09/09/2019    Past Medical History:  Diagnosis Date   Allergy 1965   Nuts, trees, grass, bees, red ants   Anemia 1980   Arthritis 2007   GERD (gastroesophageal reflux disease) 1981   Hypertension    Low iron    Ulcer 1981   Viral gastroenteritis 07/24/2022    Tobacco History: Social History   Tobacco Use  Smoking Status Former   Current packs/day: 0.00   Types: Cigarettes   Quit date: 08/27/2011   Years since quitting: 12.6   Passive exposure: Past  Smokeless Tobacco Never   Counseling given: Not Answered   Outpatient Medications Prior to Visit  Medication Sig Dispense Refill   amLODipine  (NORVASC ) 5 MG tablet Take 1 tablet (5 mg total) by mouth daily. 90 tablet 3   celecoxib  (CELEBREX ) 200 MG capsule Take 1 capsule (200 mg total) by mouth 2 (two) times daily.  180 capsule 3   ciprofloxacin -dexamethasone  (CIPRODEX ) OTIC suspension Place 4 drops into the left ear 2 (two) times daily. 7.5 mL 0   diclofenac  Sodium (VOLTAREN ) 1 % GEL APPLY 4 G TOPICALLY 4 (FOUR) TIMES DAILY AS NEEDED. 100 g 3   famotidine  (PEPCID ) 40 MG tablet TAKE 1 TABLET BY MOUTH EVERY DAY 90 tablet 1   lidocaine  (LIDODERM ) 5 % PLEASE SEE ATTACHED FOR DETAILED DIRECTIONS     meclizine  (ANTIVERT ) 25 MG tablet TAKE 1 TABLET BY MOUTH 3 TIMES DAILY AS NEEDED FOR DIZZINESS. 30 tablet 0    montelukast  (SINGULAIR ) 10 MG tablet TAKE 1 TABLET BY MOUTH EVERYDAY AT BEDTIME 90 tablet 0   pantoprazole  (PROTONIX ) 40 MG tablet TAKE 1 TABLET (40 MG TOTAL) BY MOUTH 2 (TWO) TIMES DAILY. TAKE 30 MINUTES BEFORE BREAKFAST AND DINNER. 180 tablet 1   polyethylene glycol (MIRALAX / GLYCOLAX) 17 g packet Take 17 g by mouth daily.     rosuvastatin  (CRESTOR ) 10 MG tablet Take 1 tablet (10 mg total) by mouth daily. 90 tablet 0   predniSONE  (DELTASONE ) 10 MG tablet 4 tabs by mouth once daily for 2 days, then 3 tabs daily x 2 days, then 2 tabs daily x 2 days, then 1 tab daily x 2 days 20 tablet 0   No facility-administered medications prior to visit.     Review of Systems: as per hpi  Constitutional:   No  weight loss, night sweats,  Fevers, chills, fatigue, or  lassitude.  HEENT:   No headaches,  Difficulty swallowing,  Tooth/dental problems, or  Sore throat,                No sneezing, itching, ear ache, nasal congestion, post nasal drip,   CV:  No chest pain,  Orthopnea, PND, swelling in lower extremities, anasarca, dizziness, palpitations, syncope.   GI  No heartburn, indigestion, abdominal pain, nausea, vomiting, diarrhea, change in bowel habits, loss of appetite, bloody stools.   Resp: No shortness of breath with exertion or at rest.  No excess mucus, no productive cough,  No non-productive cough,  No coughing up of blood.  No change in color of mucus.  No wheezing.  No chest wall deformity  Skin: no rash or lesions.  GU: no dysuria, change in color of urine, no urgency or frequency.  No flank pain, no hematuria   MS:  No joint pain or swelling.  No decreased range of motion.  No back pain.    Physical Exam  BP 136/86   Pulse 84   Ht 5' 8 (1.727 m)   Wt 169 lb (76.7 kg)   SpO2 100%   BMI 25.70 kg/m   GEN: A/Ox3; pleasant , NAD, well nourished    HEENT:  [place]/AT,  EACs-clear, TMs-wnl, NOSE-clear, THROAT-clear, no lesions, no postnasal drip or exudate noted. Mallampati  3  NECK:  Supple w/ fair ROM; no JVD; normal carotid impulses w/o bruits; no thyromegaly or nodules palpated; no lymphadenopathy.    RESP  Clear  P & A; w/o, wheezes/ rales/ or rhonchi. no accessory muscle use, no dullness to percussion  CARD:  RRR, no m/r/g, no peripheral edema, pulses intact, no cyanosis or clubbing.  GI:   Soft & nt; nml bowel sounds; no organomegaly or masses detected.   Musco: Warm bil, no deformities or joint swelling noted.   Neuro: alert, no focal deficits noted.    Skin: Warm, no lesions or rashes    Lab Results:  CBC  Component Value Date/Time   WBC 6.4 03/27/2023 1409   WBC 7.1 01/02/2023 1430   RBC 3.62 (L) 03/27/2023 1409   HGB 10.2 (L) 03/27/2023 1409   HCT 29.3 (L) 03/27/2023 1409   PLT 264 03/27/2023 1409   MCV 80.9 03/27/2023 1409   MCH 28.2 03/27/2023 1409   MCHC 34.8 03/27/2023 1409   RDW 13.5 03/27/2023 1409   LYMPHSABS 2.6 03/27/2023 1409   MONOABS 0.4 03/27/2023 1409   EOSABS 0.1 03/27/2023 1409   BASOSABS 0.0 03/27/2023 1409    BMET    Component Value Date/Time   NA 140 03/27/2023 1409   K 3.7 03/27/2023 1409   CL 107 03/27/2023 1409   CO2 28 03/27/2023 1409   GLUCOSE 106 (H) 03/27/2023 1409   BUN 20 03/27/2023 1409   CREATININE 1.08 (H) 03/27/2023 1409   CALCIUM  9.3 03/27/2023 1409   GFRNONAA 58 (L) 03/27/2023 1409   GFRAA >60 07/17/2018 2006    BNP No results found for: BNP  ProBNP No results found for: PROBNP  Imaging: No results found.  Administration History     None           No data to display          No results found for: NITRICOXIDE   Assessment & Plan:   Assessment & Plan Excessive daytime sleepiness  Assessment and Plan Assessment & Plan Suspected obstructive sleep apnea Mild sleep apnea diagnosed 16 years ago; untreated. Current symptoms include nocturnal awakenings, sleep paralysis, dyspnea, daytime fatigue, and snoring. Family history of sleep apnea. Differential  includes other sleep disorders.  - Ordered home sleep test to assess current severity. - Sleep hygiene reviewed: elevate head of bed, sleep on side, avoid TV and alcohol before bed, limit afternoon caffeine. - Consider in-lab sleep study based on c/o sleep paralysis and pending HST results.  Return in about 7 weeks (around 06/19/2024) for sleep study review.  Candis Dandy, PA-C 05/01/2024

## 2024-05-13 ENCOUNTER — Encounter (HOSPITAL_BASED_OUTPATIENT_CLINIC_OR_DEPARTMENT_OTHER): Payer: Self-pay

## 2024-05-28 ENCOUNTER — Telehealth: Admitting: Family Medicine

## 2024-05-28 ENCOUNTER — Other Ambulatory Visit: Payer: Self-pay | Admitting: Medical Genetics

## 2024-05-28 DIAGNOSIS — Z006 Encounter for examination for normal comparison and control in clinical research program: Secondary | ICD-10-CM

## 2024-05-28 DIAGNOSIS — U071 COVID-19: Secondary | ICD-10-CM | POA: Diagnosis not present

## 2024-05-28 MED ORDER — BENZONATATE 100 MG PO CAPS: 100.0000 mg | ORAL_CAPSULE | Freq: Three times a day (TID) | ORAL | 0 refills | Status: AC | PRN

## 2024-05-28 MED ORDER — ALBUTEROL SULFATE HFA 108 (90 BASE) MCG/ACT IN AERS: 1.0000 | INHALATION_SPRAY | Freq: Four times a day (QID) | RESPIRATORY_TRACT | 0 refills | Status: AC | PRN

## 2024-05-28 NOTE — Addendum Note (Signed)
 Addended by: PENNSTROM, JORDYN on: 05/28/2024 11:31 AM   Modules accepted: Orders

## 2024-05-28 NOTE — Progress Notes (Signed)
 " Virtual Visit Consent   Barbara [name], you are scheduled for a virtual visit with a  provider today. Just as with appointments in the office, your consent must be obtained to participate. Your consent will be active for this visit and any virtual visit you may have with one of our providers in the next 365 days. If you have a MyChart account, a copy of this consent can be sent to you electronically.  As this is a virtual visit, video technology does not allow for your provider to perform a traditional examination. This may limit your provider's ability to fully assess your condition. If your provider identifies any concerns that need to be evaluated in person or the need to arrange testing (such as labs, EKG, etc.), we will make arrangements to do so. Although advances in technology are sophisticated, we cannot ensure that it will always work on either your end or our end. If the connection with a video visit is poor, the visit may have to be switched to a telephone visit. With either a video or telephone visit, we are not always able to ensure that we have a secure connection.  By engaging in this virtual visit, you consent to the provision of healthcare and authorize for your insurance to be billed (if applicable) for the services provided during this visit. Depending on your insurance coverage, you may receive a charge related to this service.  I need to obtain your verbal consent now. Are you willing to proceed with your visit today? Barbara [name] has provided verbal consent on 05/28/2024 for a virtual visit (video or telephone). [name], NP  Date: 05/28/2024 4:07 PM   Virtual Visit via Video Note   I, [name], connected with  Barbara [name]  ([id number], Nov 04, 1960) on 05/28/2024 at  4:00 PM EST by a video-enabled telemedicine application and verified that I am speaking with the correct person using two identifiers.  Location: Patient: Virtual Visit Location Patient:  Home Provider: Virtual Visit Location Provider: Home Office   I discussed the limitations of evaluation and management by telemedicine and the availability of in person appointments. The patient expressed understanding and agreed to proceed.    History of Present Illness: Barbara [name] is a 64 y.o. who identifies as a female who was assigned female at birth, and is being seen today for COVID positive tested yesterday, symptoms first started on Sunday.  Exposed at work.  Reports that this time her chest hurting a lot- with coughing- due to rib cage pain. Sore throat- due to post nasal drip, headache and fever of 102.3.  Has been using tylenol .   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   Deficiency anemia 01/09/2023   Iron deficiency anemia due to chronic blood loss 12/08/2022   Tachycardia 12/08/2022   Diarrhea 10/19/2022   Syncope and collapse 10/09/2022   Hypokalemia 10/09/2022   Prolonged QT interval 10/09/2022   Raynaud's phenomenon without gangrene 07/11/2022   Lower back pain 03/28/2022   Onychomycosis 03/28/2022   Primary hypertension 12/23/2021   Abnormal uterine bleeding (AUB) 11/25/2021   GERD (gastroesophageal reflux disease) 08/27/2014  Allergies: Allergies[1] Medications: Current Medications[2]  Observations/Objective: Patient is well-developed, well-nourished in no acute distress.  Resting comfortably  at home.  Head is normocephalic, atraumatic.  No labored breathing.  Speech is clear and coherent with logical content.  Patient is alert and oriented at baseline.    Assessment and Plan:  1. COVID (Primary)  -  benzonatate  (TESSALON ) 100 MG capsule; Take 1-2 capsules (100-200 mg total) by mouth 3 (three) times daily as needed for cough.  Dispense: 30 capsule; Refill: 0 - albuterol  (VENTOLIN  HFA) 108 (90 Base) MCG/ACT inhaler; Inhale 1-2 puffs into the lungs every 6 (six) hours as needed for wheezing or shortness of breath (cough).  Dispense: 8 g; Refill: 0  - Continue OTC symptomatic management of choice - Info for COVID sent on AVS as well - Take prescribed medications as directed - Push fluids - Rest as needed - Discussed return precautions and when to seek in-person evaluation, sent via AVS as well    Reviewed side effects, risks and benefits of medication.    Patient acknowledged agreement and understanding of the plan.   Past Medical, Surgical, Social History, Allergies, and Medications have been Reviewed.    Follow Up Instructions: I discussed the assessment and treatment plan with the patient. The patient was provided an opportunity to ask questions and all were answered. The patient agreed with the plan and demonstrated an understanding of the instructions.  A copy of instructions were sent to the patient via MyChart unless otherwise noted below.    The patient was advised to call back or seek an in-person evaluation if the symptoms worsen or if the condition fails to improve as anticipated.    [name], NP     [1]  Allergies Allergen Reactions   Iodinated Contrast Media Anaphylaxis    States he face swells when she has CT iodinated contrast   Iodine Swelling    Other reaction(s): Facial swelling   Morphine  And Codeine Itching   Other Swelling    All Nut Allergies   Sulfa Antibiotics Swelling   Sulfamethoxazole-Trimethoprim Other (See Comments)  [2]  Current Outpatient Medications:    amLODipine  (NORVASC ) 5 MG tablet, Take 1 tablet (5 mg total) by mouth daily., Disp: 90 tablet, Rfl: 3   celecoxib  (CELEBREX ) 200 MG capsule, Take 1 capsule (200 mg total) by mouth 2  (two) times daily., Disp: 180 capsule, Rfl: 3   ciprofloxacin -dexamethasone  (CIPRODEX ) OTIC suspension, Place 4 drops into the left ear 2 (two) times daily., Disp: 7.5 mL, Rfl: 0   diclofenac  Sodium (VOLTAREN ) 1 % GEL, APPLY 4 G TOPICALLY 4 (FOUR) TIMES DAILY AS NEEDED., Disp: 100 g, Rfl: 3   famotidine  (PEPCID ) 40 MG tablet, TAKE 1 TABLET BY MOUTH EVERY DAY, Disp: 90 tablet, Rfl: 1   lidocaine  (LIDODERM ) 5 %, PLEASE SEE ATTACHED FOR DETAILED DIRECTIONS, Disp: , Rfl:    meclizine  (ANTIVERT ) 25 MG tablet, TAKE 1 TABLET BY MOUTH 3 TIMES DAILY AS NEEDED FOR DIZZINESS., Disp: 30 tablet, Rfl: 0   montelukast  (SINGULAIR ) 10 MG tablet, TAKE 1 TABLET BY MOUTH EVERYDAY AT BEDTIME, Disp: 90 tablet, Rfl: 0   pantoprazole  (PROTONIX ) 40 MG tablet, TAKE 1 TABLET (40 MG TOTAL) BY MOUTH 2 (TWO) TIMES DAILY. TAKE 30 MINUTES BEFORE BREAKFAST AND DINNER., Disp: 180 tablet, Rfl: 1   polyethylene glycol (MIRALAX / GLYCOLAX) 17 g packet, Take 17 g by mouth daily., Disp: , Rfl:    predniSONE  (DELTASONE ) 10 MG tablet, 4 tabs by mouth once  daily for 2 days, then 3 tabs daily x 2 days, then 2 tabs daily x 2 days, then 1 tab daily x 2 days, Disp: 20 tablet, Rfl: 0   rosuvastatin  (CRESTOR ) 10 MG tablet, Take 1 tablet (10 mg total) by mouth daily., Disp: 90 tablet, Rfl: 0  "

## 2024-05-28 NOTE — Patient Instructions (Signed)
 " Barbara [name], thank you for joining Barbara CHRISTELLA Barefoot, NP for today's virtual visit.  While this provider is not your primary care provider (PCP), if your PCP is located in our provider database this encounter information will be shared with them immediately following your visit.   A [facility] MyChart account gives you access to today's visit and all your visits, tests, and labs performed at [facility]  click here if you don't have a  MyChart account or go to [email or web address]  Consent: (Patient) Barbara [name] provided verbal consent for this virtual visit at the beginning of the encounter.  Current Medications:  Current Outpatient Medications:    albuterol  (VENTOLIN  HFA) 108 (90 Base) MCG/ACT inhaler, Inhale 1-2 puffs into the lungs every 6 (six) hours as needed for wheezing or shortness of breath (cough)., Disp: 8 g, Rfl: 0   benzonatate  (TESSALON ) 100 MG capsule, Take 1-2 capsules (100-200 mg total) by mouth 3 (three) times daily as needed for cough., Disp: 30 capsule, Rfl: 0   amLODipine  (NORVASC ) 5 MG tablet, Take 1 tablet (5 mg total) by mouth daily., Disp: 90 tablet, Rfl: 3   celecoxib  (CELEBREX ) 200 MG capsule, Take 1 capsule (200 mg total) by mouth 2 (two) times daily., Disp: 180 capsule, Rfl: 3   ciprofloxacin -dexamethasone  (CIPRODEX ) OTIC suspension, Place 4 drops into the left ear 2 (two) times daily., Disp: 7.5 mL, Rfl: 0   diclofenac  Sodium (VOLTAREN ) 1 % GEL, APPLY 4 G TOPICALLY 4 (FOUR) TIMES DAILY AS NEEDED., Disp: 100 g, Rfl: 3   famotidine  (PEPCID ) 40 MG tablet, TAKE 1 TABLET BY MOUTH EVERY DAY, Disp: 90 tablet, Rfl: 1   lidocaine  (LIDODERM ) 5 %, PLEASE SEE ATTACHED FOR DETAILED DIRECTIONS, Disp: , Rfl:    meclizine  (ANTIVERT ) 25 MG tablet, TAKE 1 TABLET BY MOUTH 3 TIMES DAILY AS NEEDED FOR DIZZINESS., Disp: 30 tablet, Rfl: 0   montelukast  (SINGULAIR ) 10 MG tablet, TAKE 1 TABLET BY MOUTH EVERYDAY AT BEDTIME, Disp: 90 tablet, Rfl: 0    pantoprazole  (PROTONIX ) 40 MG tablet, TAKE 1 TABLET (40 MG TOTAL) BY MOUTH 2 (TWO) TIMES DAILY. TAKE 30 MINUTES BEFORE BREAKFAST AND DINNER., Disp: 180 tablet, Rfl: 1   polyethylene glycol (MIRALAX / GLYCOLAX) 17 g packet, Take 17 g by mouth daily., Disp: , Rfl:    predniSONE  (DELTASONE ) 10 MG tablet, 4 tabs by mouth once daily for 2 days, then 3 tabs daily x 2 days, then 2 tabs daily x 2 days, then 1 tab daily x 2 days, Disp: 20 tablet, Rfl: 0   rosuvastatin  (CRESTOR ) 10 MG tablet, Take 1 tablet (10 mg total) by mouth daily., Disp: 90 tablet, Rfl: 0   Medications ordered in this encounter:  Meds ordered this encounter  Medications   benzonatate  (TESSALON ) 100 MG capsule    Sig: Take 1-2 capsules (100-200 mg total) by mouth 3 (three) times daily as needed for cough.    Dispense:  30 capsule    Refill:  0    Supervising Provider:   LAMPTEY, PHILIP O [[id number]]   albuterol  (VENTOLIN  HFA) 108 (90 Base) MCG/ACT inhaler    Sig: Inhale 1-2 puffs into the lungs every 6 (six) hours as needed for wheezing or shortness of breath (cough).    Dispense:  8 g    Refill:  0    Supervising Provider:   BLAISE ALEENE KIDD [[id number]]     *If you need refills on other medications prior to your next appointment, please contact your  pharmacy*  Follow-Up: Call back or seek an in-person evaluation if the symptoms worsen or if the condition fails to improve as anticipated.  [facility] [phone]  Care Instructions:  - Continue OTC symptomatic management of choice - Info for COVID sent on AVS as well - Take prescribed medications as directed - Push fluids - Rest as needed - Discussed return precautions and when to seek in-person evaluation, sent via AVS as well    Isolation Instructions: You are to isolate at home until you have been fever free for at least 24 hours without a fever-reducing medication, and symptoms have been steadily improving for 24 hours. At that time,  you can end  isolation but need to mask for an additional 5 days.   If you must be around other household members who do not have symptoms, you need to make sure that both you and the family members are masking consistently with a high-quality mask.  If you note any worsening of symptoms despite treatment, please seek an in-person evaluation ASAP. If you note any significant shortness of breath or any chest pain, please seek ER evaluation. Please do not delay care!   COVID-19: What to Do if You Are Sick If you test positive and are an older adult or someone who is at high risk of getting very sick from COVID-19, treatment may be available. Contact a healthcare provider right away after a positive test to determine if you are eligible, even if your symptoms are mild right now. You can also visit a Test to Treat location and, if eligible, receive a prescription from a provider. Don't delay: Treatment must be started within the first few days to be effective. If you have a fever, cough, or other symptoms, you might have COVID-19. Most people have mild illness and are able to recover at home. If you are sick: Keep track of your symptoms. If you have an emergency warning sign (including trouble breathing), call 911. Steps to help prevent the spread of COVID-19 if you are sick If you are sick with COVID-19 or think you might have COVID-19, follow the steps below to care for yourself and to help protect other people in your home and community. Stay home except to get medical care Stay home. Most people with COVID-19 have mild illness and can recover at home without medical care. Do not leave your home, except to get medical care. Do not visit public areas and do not go to places where you are unable to wear a mask. Take care of yourself. Get rest and stay hydrated. Take over-the-counter medicines, such as acetaminophen , to help you feel better. Stay in touch with your doctor. Call before you get medical care. Be sure  to get care if you have trouble breathing, or have any other emergency warning signs, or if you think it is an emergency. Avoid public transportation, ride-sharing, or taxis if possible. Get tested If you have symptoms of COVID-19, get tested. While waiting for test results, stay away from others, including staying apart from those living in your household. Get tested as soon as possible after your symptoms start. Treatments may be available for people with COVID-19 who are at risk for becoming very sick. Don't delay: Treatment must be started early to be effective--some treatments must begin within 5 days of your first symptoms. Contact your healthcare provider right away if your test result is positive to determine if you are eligible. Self-tests are one of several options  for testing for the virus that causes COVID-19 and may be more convenient than laboratory-based tests and point-of-care tests. Ask your healthcare provider or your local health department if you need help interpreting your test results. You can visit your state, tribal, local, and territorial health department's website to look for the latest local information on testing sites. Separate yourself from other people As much as possible, stay in a specific room and away from other people and pets in your home. If possible, you should use a separate bathroom. If you need to be around other people or animals in or outside of the home, wear a well-fitting mask. Tell your close contacts that they may have been exposed to COVID-19. An infected person can spread COVID-19 starting 48 hours (or 2 days) before the person has any symptoms or tests positive. By letting your close contacts know they may have been exposed to COVID-19, you are helping to protect everyone. See COVID-19 and Animals if you have questions about pets. If you are diagnosed with COVID-19, someone from the health department may call you. Answer the call to slow the  spread. Monitor your symptoms Symptoms of COVID-19 include fever, cough, or other symptoms. Follow care instructions from your healthcare provider and local health department. Your local health authorities may give instructions on checking your symptoms and reporting information. When to seek emergency medical attention Look for emergency warning signs* for COVID-19. If someone is showing any of these signs, seek emergency medical care immediately: Trouble breathing Persistent pain or pressure in the chest New confusion Inability to wake or stay awake Pale, gray, or blue-colored skin, lips, or nail beds, depending on skin tone *This list is not all possible symptoms. Please call your medical provider for any other symptoms that are severe or concerning to you. Call 911 or call ahead to your local emergency facility: Notify the operator that you are seeking care for someone who has or may have COVID-19. Call ahead before visiting your doctor Call ahead. Many medical visits for routine care are being postponed or done by phone or telemedicine. If you have a medical appointment that cannot be postponed, call your doctor's office, and tell them you have or may have COVID-19. This will help the office protect themselves and other patients. If you are sick, wear a well-fitting mask You should wear a mask if you must be around other people or animals, including pets (even at home). Wear a mask with the best fit, protection, and comfort for you. You don't need to wear the mask if you are alone. If you can't put on a mask (because of trouble breathing, for example), cover your coughs and sneezes in some other way. Try to stay at least 6 feet away from other people. This will help protect the people around you. Masks should not be placed on young children under age 39 years, anyone who has trouble breathing, or anyone who is not able to remove the mask without help. Cover your coughs and sneezes Cover  your mouth and nose with a tissue when you cough or sneeze. Throw away used tissues in a lined trash can. Immediately wash your hands with soap and water for at least 20 seconds. If soap and water are not available, clean your hands with an alcohol-based hand sanitizer that contains at least 60% alcohol. Clean your hands often Wash your hands often with soap and water for at least 20 seconds. This is especially important after blowing your nose, coughing,  or sneezing; going to the bathroom; and before eating or preparing food. Use hand sanitizer if soap and water are not available. Use an alcohol-based hand sanitizer with at least 60% alcohol, covering all surfaces of your hands and rubbing them together until they feel dry. Soap and water are the best option, especially if hands are visibly dirty. Avoid touching your eyes, nose, and mouth with unwashed hands. Handwashing Tips Avoid sharing personal household items Do not share dishes, drinking glasses, cups, eating utensils, towels, or bedding with other people in your home. Wash these items thoroughly after using them with soap and water or put in the dishwasher. Clean surfaces in your home regularly Clean and disinfect high-touch surfaces (for example, doorknobs, tables, handles, light switches, and countertops) in your sick room and bathroom. In shared spaces, you should clean and disinfect surfaces and items after each use by the person who is ill. If you are sick and cannot clean, a caregiver or other person should only clean and disinfect the area around you (such as your bedroom and bathroom) on an as needed basis. Your caregiver/other person should wait as long as possible (at least several hours) and wear a mask before entering, cleaning, and disinfecting shared spaces that you use. Clean and disinfect areas that may have blood, stool, or body fluids on them. Use household cleaners and disinfectants. Clean visible dirty surfaces with  household cleaners containing soap or detergent. Then, use a household disinfectant. Use a product from [employer] List N: Disinfectants for Coronavirus (COVID-19). Be sure to follow the instructions on the label to ensure safe and effective use of the product. Many products recommend keeping the surface wet with a disinfectant for a certain period of time (look at contact time on the product label). You may also need to wear personal protective equipment, such as gloves, depending on the directions on the product label. Immediately after disinfecting, wash your hands with soap and water for 20 seconds. For completed guidance on cleaning and disinfecting your home, visit Complete Disinfection Guidance. Take steps to improve ventilation at home Improve ventilation (air flow) at home to help prevent from spreading COVID-19 to other people in your household. Clear out COVID-19 virus particles in the air by opening windows, using air filters, and turning on fans in your home. Use this interactive tool to learn how to improve air flow in your home. When you can be around others after being sick with COVID-19 Deciding when you can be around others is different for different situations. Find out when you can safely end home isolation. For any additional questions about your care, contact your healthcare provider or state or local health department. 08/11/2020 Content source: [facility] for Immunization and Respiratory Diseases ([facility]), Division of Viral Diseases This information is not intended to replace advice given to you by your health care provider. Make sure you discuss any questions you have with your health care provider. Document Revised: 09/24/2020 Document Reviewed: 09/24/2020 Elsevier Patient Education  2022 [employer].     If you have been instructed to have an in-person evaluation today at a local Urgent Care facility, please use the link below. It will take you to a list of all of  our available [facility], including address, phone number and hours of operation. Please do not delay care.  [facility]  If you or a family member do not have a primary care provider, use the link below to schedule a visit and  establish care. When you choose a [facility] primary care physician or advanced practice provider, you gain a long-term partner in health. Find a Primary Care Provider  Learn more about [facility]'s in-office and virtual care options: [place] - Get Care Now  "

## 2024-06-24 ENCOUNTER — Ambulatory Visit (HOSPITAL_BASED_OUTPATIENT_CLINIC_OR_DEPARTMENT_OTHER)

## 2024-07-22 ENCOUNTER — Ambulatory Visit (HOSPITAL_BASED_OUTPATIENT_CLINIC_OR_DEPARTMENT_OTHER)
